# Patient Record
Sex: Male | Born: 1940 | Race: White | Hispanic: No | Marital: Married | Smoking: Former smoker
Health system: Southern US, Community
[De-identification: ages and names within clinical notes are randomized; demographics above are authoritative.]

## PROBLEM LIST (undated history)

## (undated) DIAGNOSIS — C61 Malignant neoplasm of prostate: Secondary | ICD-10-CM

## (undated) DIAGNOSIS — K5792 Diverticulitis of intestine, part unspecified, without perforation or abscess without bleeding: Secondary | ICD-10-CM

## (undated) DIAGNOSIS — I10 Essential (primary) hypertension: Secondary | ICD-10-CM

## (undated) DIAGNOSIS — IMO0001 Reserved for inherently not codable concepts without codable children: Secondary | ICD-10-CM

## (undated) DIAGNOSIS — L570 Actinic keratosis: Secondary | ICD-10-CM

## (undated) DIAGNOSIS — I493 Ventricular premature depolarization: Secondary | ICD-10-CM

## (undated) DIAGNOSIS — R911 Solitary pulmonary nodule: Secondary | ICD-10-CM

## (undated) DIAGNOSIS — R51 Headache: Secondary | ICD-10-CM

## (undated) DIAGNOSIS — E785 Hyperlipidemia, unspecified: Secondary | ICD-10-CM

## (undated) DIAGNOSIS — R05 Cough: Secondary | ICD-10-CM

## (undated) DIAGNOSIS — Z9289 Personal history of other medical treatment: Secondary | ICD-10-CM

## (undated) DIAGNOSIS — M479 Spondylosis, unspecified: Secondary | ICD-10-CM

## (undated) DIAGNOSIS — N183 Chronic kidney disease, stage 3 unspecified: Secondary | ICD-10-CM

## (undated) DIAGNOSIS — M67439 Ganglion, unspecified wrist: Secondary | ICD-10-CM

## (undated) DIAGNOSIS — C439 Malignant melanoma of skin, unspecified: Secondary | ICD-10-CM

## (undated) DIAGNOSIS — R001 Bradycardia, unspecified: Secondary | ICD-10-CM

## (undated) DIAGNOSIS — I714 Abdominal aortic aneurysm, without rupture: Secondary | ICD-10-CM

## (undated) HISTORY — DX: Diverticulitis of intestine, part unspecified, without perforation or abscess without bleeding: K57.92

## (undated) HISTORY — DX: Headache: R51

## (undated) HISTORY — DX: Actinic keratosis: L57.0

## (undated) HISTORY — DX: Personal history of other medical treatment: Z92.89

## (undated) HISTORY — DX: Ventricular premature depolarization: I49.3

## (undated) HISTORY — DX: Bradycardia, unspecified: R00.1

## (undated) HISTORY — DX: Essential (primary) hypertension: I10

## (undated) HISTORY — DX: Spondylosis, unspecified: M47.9

## (undated) HISTORY — DX: Ganglion, unspecified wrist: M67.439

## (undated) HISTORY — PX: PROSTATECTOMY: SHX69

## (undated) HISTORY — DX: Chronic kidney disease, stage 3 unspecified: N18.30

## (undated) HISTORY — DX: Reserved for inherently not codable concepts without codable children: IMO0001

## (undated) HISTORY — DX: Hyperlipidemia, unspecified: E78.5

## (undated) HISTORY — DX: Solitary pulmonary nodule: R91.1

## (undated) HISTORY — DX: Chronic kidney disease, stage 3 (moderate): N18.3

## (undated) HISTORY — DX: Malignant melanoma of skin, unspecified: C43.9

## (undated) HISTORY — DX: Cough: R05

## (undated) HISTORY — PX: LUMBAR DISC SURGERY: SHX700

## (undated) HISTORY — DX: Abdominal aortic aneurysm, without rupture: I71.4

## (undated) HISTORY — PX: SHOULDER SURGERY: SHX246

---

## 2001-12-03 ENCOUNTER — Encounter: Payer: Self-pay | Admitting: Neurosurgery

## 2001-12-03 ENCOUNTER — Ambulatory Visit (HOSPITAL_COMMUNITY): Admission: RE | Admit: 2001-12-03 | Discharge: 2001-12-03 | Payer: Self-pay | Admitting: Neurosurgery

## 2003-09-02 ENCOUNTER — Encounter: Admission: RE | Admit: 2003-09-02 | Discharge: 2003-11-03 | Payer: Self-pay | Admitting: Family Medicine

## 2004-03-31 ENCOUNTER — Encounter: Admission: RE | Admit: 2004-03-31 | Discharge: 2004-03-31 | Payer: Self-pay | Admitting: Family Medicine

## 2004-04-26 ENCOUNTER — Ambulatory Visit (HOSPITAL_COMMUNITY): Admission: RE | Admit: 2004-04-26 | Discharge: 2004-04-27 | Payer: Self-pay | Admitting: Neurosurgery

## 2008-07-07 ENCOUNTER — Encounter (INDEPENDENT_AMBULATORY_CARE_PROVIDER_SITE_OTHER): Payer: Self-pay | Admitting: Urology

## 2008-07-07 ENCOUNTER — Inpatient Hospital Stay (HOSPITAL_COMMUNITY): Admission: RE | Admit: 2008-07-07 | Discharge: 2008-07-08 | Payer: Self-pay | Admitting: Urology

## 2008-07-10 ENCOUNTER — Emergency Department (HOSPITAL_COMMUNITY): Admission: EM | Admit: 2008-07-10 | Discharge: 2008-07-11 | Payer: Self-pay | Admitting: Emergency Medicine

## 2010-06-20 DIAGNOSIS — K5792 Diverticulitis of intestine, part unspecified, without perforation or abscess without bleeding: Secondary | ICD-10-CM

## 2010-06-20 HISTORY — DX: Diverticulitis of intestine, part unspecified, without perforation or abscess without bleeding: K57.92

## 2010-07-09 ENCOUNTER — Encounter
Admission: RE | Admit: 2010-07-09 | Discharge: 2010-07-20 | Payer: Self-pay | Source: Home / Self Care | Attending: Family Medicine | Admitting: Family Medicine

## 2010-07-30 ENCOUNTER — Ambulatory Visit: Payer: Medicare Other | Attending: Family Medicine | Admitting: Physical Therapy

## 2010-07-30 DIAGNOSIS — M545 Low back pain, unspecified: Secondary | ICD-10-CM | POA: Insufficient documentation

## 2010-07-30 DIAGNOSIS — IMO0001 Reserved for inherently not codable concepts without codable children: Secondary | ICD-10-CM | POA: Insufficient documentation

## 2010-07-30 DIAGNOSIS — M25659 Stiffness of unspecified hip, not elsewhere classified: Secondary | ICD-10-CM | POA: Insufficient documentation

## 2010-08-03 ENCOUNTER — Encounter: Payer: Medicare Other | Admitting: Physical Therapy

## 2010-08-03 ENCOUNTER — Ambulatory Visit: Payer: Medicare Other | Admitting: Physical Therapy

## 2010-08-04 ENCOUNTER — Encounter: Payer: Medicare Other | Admitting: Physical Therapy

## 2010-08-06 ENCOUNTER — Ambulatory Visit: Payer: Medicare Other | Admitting: Physical Therapy

## 2010-08-10 ENCOUNTER — Other Ambulatory Visit: Payer: Self-pay | Admitting: Family Medicine

## 2010-08-10 ENCOUNTER — Encounter: Payer: Medicare Other | Admitting: Physical Therapy

## 2010-08-10 DIAGNOSIS — M545 Low back pain, unspecified: Secondary | ICD-10-CM

## 2010-08-13 ENCOUNTER — Other Ambulatory Visit: Payer: Medicare Other

## 2010-08-20 ENCOUNTER — Ambulatory Visit: Payer: Medicare Other | Attending: Family Medicine | Admitting: Physical Therapy

## 2010-08-20 DIAGNOSIS — M25659 Stiffness of unspecified hip, not elsewhere classified: Secondary | ICD-10-CM | POA: Insufficient documentation

## 2010-08-20 DIAGNOSIS — M545 Low back pain, unspecified: Secondary | ICD-10-CM | POA: Insufficient documentation

## 2010-08-20 DIAGNOSIS — IMO0001 Reserved for inherently not codable concepts without codable children: Secondary | ICD-10-CM | POA: Insufficient documentation

## 2010-08-24 ENCOUNTER — Ambulatory Visit: Payer: Medicare Other | Admitting: Physical Therapy

## 2010-08-25 ENCOUNTER — Ambulatory Visit: Payer: Medicare Other | Admitting: Physical Therapy

## 2010-08-30 ENCOUNTER — Ambulatory Visit
Admission: RE | Admit: 2010-08-30 | Discharge: 2010-08-30 | Disposition: A | Discharge status: Home / Self Care | Payer: Medicare Other | Source: Ambulatory Visit | Attending: Family Medicine | Admitting: Family Medicine

## 2010-08-30 DIAGNOSIS — M545 Low back pain, unspecified: Secondary | ICD-10-CM

## 2010-08-31 ENCOUNTER — Ambulatory Visit: Payer: Medicare Other | Admitting: Physical Therapy

## 2010-10-04 LAB — BASIC METABOLIC PANEL
BUN: 30 mg/dL — ABNORMAL HIGH (ref 6–23)
CO2: 27 mEq/L (ref 19–32)
Calcium: 9.2 mg/dL (ref 8.4–10.5)
Chloride: 105 mEq/L (ref 96–112)
Creatinine, Ser: 1.21 mg/dL (ref 0.4–1.5)
GFR calc Af Amer: >60 mL/min (ref >60)
GFR calc non Af Amer: 60 mL/min — ABNORMAL LOW (ref >60)
Glucose, Bld: 140 mg/dL — ABNORMAL HIGH (ref 70–99)
Potassium: 3.9 mEq/L (ref 3.5–5.1)
Sodium: 140 mEq/L (ref 135–145)

## 2010-10-04 LAB — HEMOGLOBIN AND HEMATOCRIT, BLOOD
HCT: 35.1 % — ABNORMAL LOW (ref 39.0–52.0)
HCT: 38.1 % — ABNORMAL LOW (ref 39.0–52.0)
Hemoglobin: 11.9 g/dL — ABNORMAL LOW (ref 13.0–17.0)
Hemoglobin: 12.6 g/dL — ABNORMAL LOW (ref 13.0–17.0)

## 2010-10-04 LAB — CBC
HCT: 39.9 % (ref 39.0–52.0)
Hemoglobin: 13.2 g/dL (ref 13.0–17.0)
MCHC: 33.1 g/dL (ref 30.0–36.0)
MCV: 92.8 fL (ref 78.0–100.0)
Platelets: 193 10*3/uL (ref 150–400)
RBC: 4.3 MIL/uL (ref 4.22–5.81)
RDW: 13.8 % (ref 11.5–15.5)
WBC: 7.3 10*3/uL (ref 4.0–10.5)

## 2010-10-04 LAB — TYPE AND SCREEN
ABO/RH(D): O NEG
Antibody Screen: NEGATIVE

## 2010-10-04 LAB — ABO/RH: ABO/RH(D): O NEG

## 2010-11-02 NOTE — Discharge Summary (Signed)
NAMECORY, Victor Stark              ACCOUNT NO.:  [id number]   MEDICAL RECORD NO.:  [id number]          PATIENT TYPE:  INP   LOCATION:  1425                         FACILITY:  [facility]   PHYSICIAN:  Heloise Purpura, MD      DATE OF BIRTH:  [birth year]   DATE OF ADMISSION:  07/07/2008  DATE OF DISCHARGE:  07/08/2008                               DISCHARGE SUMMARY   ADMISSION DIAGNOSES:  Clinically localized adenocarcinoma of the  prostate.   DISCHARGE DIAGNOSES:  Clinically localized adenocarcinoma of the  prostate.   PROCEDURES:  1. Robotic assisted laparoscopic radical prostatectomy (bilateral      nerve sparing).  2. Bilateral pelvic lymph adenectomy.   HISTORY AND PHYSICAL:  For full details, please see admission history  and physical.  Briefly, [name] is a 70 year old gentleman who was  found to have clinically localized adenocarcinoma of the prostate.  After careful consideration regarding management options for treatment,  he elected to proceed with surgical therapy and a robotic assisted  laparoscopic radical prostatectomy with bilateral pelvic  lymphadenectomy.   HOSPITAL COURSE:  On July 07, 2008, he was taken to the operating  room and underwent a robotic assisted laparoscopic radical prostatectomy  which he tolerated well and without complications.  Postoperatively, he  was able to be transferred to a regular hospital room following recovery  from anesthesia.  He was able to begin ambulation that evening.  He  remained hemodynamically stable.  Postoperative hematocrit was 38.1.  On  the morning of postoperative day #1, his hematocrit was also found to be  stable at 35.1.  He maintained excellent urine output with minimal  output from his pelvic drain.  Therefore, the pelvic drain was removed.  He was placed on a clear liquid diet and continued to ambulate.  He was  re-evaluated on the afternoon of postoperative day #1.  His urinary  output, blood pressure and pulse  all remained stable.  He was also able  to tolerate his clear liquid diet without nausea or vomiting.  Therefore, he was felt to be stable for discharge as he had met all  discharge criteria.   DISPOSITION:  Home.   DISCHARGE MEDICATIONS:  He was instructed to resume his regular home  medications including Lipitor and lisinopril.  In addition, he was  provided a prescription for Percocet to take as needed for pain and told  to use Colace as a stool softener.  He was also provided a prescription  for Cipro to begin 1 day prior to return for Foley catheter removal.  He  was instructed to hold his aspirin, nonsteroidal anti-inflammatory  medications, herbal supplement, and multivitamins for 10 days.   DISCHARGE INSTRUCTIONS:  He was instructed to be ambulatory but  specifically told to refrain from any heavy lifting, strenuous activity,  or driving.  He was instructed on routine Foley catheter care and told  to gradually advance his diet over the course of the next few days.   FOLLOWUP:  He will follow up in 1 week for Foley catheter removal and  skin staple removal.  Delia Chimes, NP      Heloise Purpura, MD  Electronically Signed    MA/MEDQ  D:  07/08/2008  T:  07/08/2008  Job:  [id number]

## 2010-11-02 NOTE — Op Note (Signed)
NAMEROMEO, ZIELINSKI              ACCOUNT NO.:  [id number]   MEDICAL RECORD NO.:  [id number]          PATIENT TYPE:  INP   LOCATION:  1425                         FACILITY:  [facility]   PHYSICIAN:  Heloise Purpura, MD      DATE OF BIRTH:  05/23/1941   DATE OF PROCEDURE:  07/07/2008  DATE OF DISCHARGE:                               OPERATIVE REPORT   PREOPERATIVE DIAGNOSIS:  Clinically localized adenocarcinoma of prostate  (cT1c Nx Mx).   POSTOPERATIVE DIAGNOSIS:  Clinically localized adenocarcinoma of  prostate (cT1c Nx Mx).   PROCEDURE:  Robotic assisted laparoscopic radical prostatectomy  (bilateral nerve sparing).   SURGEON:  Dr. Heloise Purpura.   ASSISTANT:  Delia Chimes, nurse practitioner.   ANESTHESIA:  General.   COMPLICATIONS:  None.   ESTIMATED BLOOD LOSS:  75 mL.   SPECIMENS:  Prostate and seminal vesicles.   DISPOSITION OF SPECIMENS:  To Pathology.   DRAINS:  1. 18-French straight catheter.  2. #19 Blake pelvic drain.   INDICATION:  Mr. Grumbine is a gentleman with clinically localized  adenocarcinoma of the prostate.  After discussion regarding management  options for treatment, he elected to proceed with surgical therapy and a  robotic prostatectomy.  The potential risks, complications, and  alternative options associated with this procedure were discussed in  detail and informed consent was obtained.   DESCRIPTION OF PROCEDURE:  The patient was taken to the operating room  and a general anesthetic was administered.  He was given preoperative  antibiotics, placed in the dorsal lithotomy position, prepped and draped  in the usual sterile fashion.  Next a preoperative time-out was  performed.  An attempt was made to place a 22-French Foley catheter, but  the patient was noted to have a narrowed urethral meatus.  Therefore an  18-French Foley catheter was placed to accommodate the urethra. This was  placed without difficulty.  A site was selected just superior  to the  umbilicus for placement of the camera port.  This was placed using a  standard open Hasson technique which allowed entry into the peritoneal  cavity under direct vision without difficulty.  A 12 mm port was then  placed and the pneumoperitoneum established.  The 0 degrees lens was  used to inspect the abdomen and there was no evidence for any intra-  abdominal injuries or other abnormalities.  The remaining ports were  then placed.  Bilateral 8 mm robotic ports were placed in the right and  left lower quadrant.  An additional 8 mm robotic port was placed in the  far left lateral abdominal wall.  A 5 mm port was placed between the  camera port and the right robotic port.  An additional 12 mm port was  placed in the far right lateral abdominal wall for laparoscopic  assistance.  All ports were placed under direct vision and without  difficulty.  The surgical cart was then docked.  With the aid of the  cautery scissors, the bladder was reflected posteriorly allowing entry  into the space of Retzius and identification of the endopelvic fascia  and prostate.  The endopelvic fascia was then incised from the apex back  to the base of prostate bilaterally and the underlying levator muscle  fibers were swept laterally off the prostate thereby isolating the  dorsal venous complex.  The dorsal vein was then stapled and divided  with a 45 mm Flex ETS stapler.  The bladder neck was then identified  with the aid of Foley catheter manipulation and was divided anteriorly  thereby exposing the Foley catheter.  The catheter balloon was then  deflated and the catheter was brought into the operative field and used  to retract the prostate anteriorly.  Dissection then proceeded as the  posterior bladder neck was divided and dissection continued between the  bladder neck and prostate.  The vasa deferentia and seminal vesicles  were then identified.  The vasa deferentia were isolated, divided and   lifted anteriorly.  The seminal vesicles were then dissected down to  their tips with care to control the seminal vesicle arterial blood  supply.  These structures were then lifted anteriorly and the space  between Denonvilliers fascia and the anterior rectum was bluntly  developed thereby isolating the vascular pedicles of the prostate.  The  lateral prostatic fascia was incised allowing the neurovascular bundles  to be released.  Hemoclips were then placed on the vascular pedicles  above the level of the neurovascular bundles and they were divided with  sharp cold scissor dissection.  The urethra was then sharply transected,  allowing the prostate specimen to be disarticulated.  The pelvis was  copiously irrigated and hemostasis was ensured.  Attention then turned  to the urethral anastomosis.  A 2-0 Vicryl slip-knot was placed between  Denonvilliers fascia, the posterior bladder neck, and the posterior  urethra to reapproximate these structures.  A double-armed 3-0 Monocryl  suture was then used to perform a 360 degree running tension-free  anastomosis between the bladder neck and urethra.  A new 20-French  straight catheter was inserted into the bladder and irrigated.  There  were no blood clots within the bladder and the anastomosis appeared to  be watertight.  The way #19 Harrison Mons drain was then brought through the  left robotic port and appropriately positioned in the pelvis.  It was  secured to the skin with a nylon suture.  The surgical cart was then  undocked.  The right lateral 12 mm port site was closed with a 0 Vicryl  suture placed with the aid of the suture passer device.  All remaining  ports were removed under direct vision and the prostate specimen was  removed intact within the Endopouch retrieval bag.  This fascial opening  was then closed with a running 0 Vicryl suture.  All port sites were  injected with 0.25% Marcaine and reapproximated the skin level with  staples.   Sterile dressings were applied.  The patient appeared to  tolerate the procedure well without complications.  He was able to be  extubated and transferred to the recovery unit in satisfactory  condition.      Heloise Purpura, MD  Electronically Signed     LB/MEDQ  D:  07/07/2008  T:  07/08/2008  Job:  [id number]

## 2010-12-29 ENCOUNTER — Ambulatory Visit: Payer: Medicare Other | Attending: Physical Medicine and Rehabilitation | Admitting: Physical Therapy

## 2010-12-29 DIAGNOSIS — M545 Low back pain, unspecified: Secondary | ICD-10-CM | POA: Insufficient documentation

## 2010-12-29 DIAGNOSIS — M2569 Stiffness of other specified joint, not elsewhere classified: Secondary | ICD-10-CM | POA: Insufficient documentation

## 2010-12-29 DIAGNOSIS — IMO0001 Reserved for inherently not codable concepts without codable children: Secondary | ICD-10-CM | POA: Insufficient documentation

## 2011-01-03 ENCOUNTER — Ambulatory Visit: Payer: Medicare Other | Admitting: Physical Therapy

## 2011-01-14 ENCOUNTER — Ambulatory Visit: Payer: Medicare Other | Admitting: Physical Therapy

## 2011-01-20 ENCOUNTER — Ambulatory Visit: Payer: Medicare Other | Attending: Physical Medicine and Rehabilitation | Admitting: Physical Therapy

## 2011-01-20 DIAGNOSIS — M2569 Stiffness of other specified joint, not elsewhere classified: Secondary | ICD-10-CM | POA: Insufficient documentation

## 2011-01-20 DIAGNOSIS — M545 Low back pain, unspecified: Secondary | ICD-10-CM | POA: Insufficient documentation

## 2011-01-20 DIAGNOSIS — IMO0001 Reserved for inherently not codable concepts without codable children: Secondary | ICD-10-CM | POA: Insufficient documentation

## 2011-01-21 ENCOUNTER — Ambulatory Visit: Payer: Medicare Other | Admitting: Physical Therapy

## 2011-01-25 ENCOUNTER — Ambulatory Visit: Payer: Medicare Other | Admitting: Physical Therapy

## 2011-01-27 ENCOUNTER — Encounter: Payer: Medicare Other | Admitting: Physical Therapy

## 2011-02-01 ENCOUNTER — Ambulatory Visit: Payer: Medicare Other | Admitting: Physical Therapy

## 2011-02-15 ENCOUNTER — Ambulatory Visit: Payer: Medicare Other | Admitting: Physical Therapy

## 2011-02-17 ENCOUNTER — Encounter: Payer: Medicare Other | Admitting: Physical Therapy

## 2011-02-22 ENCOUNTER — Encounter: Payer: Medicare Other | Admitting: Physical Therapy

## 2011-02-24 ENCOUNTER — Encounter: Payer: Medicare Other | Admitting: Physical Therapy

## 2011-04-15 ENCOUNTER — Other Ambulatory Visit: Payer: Self-pay | Admitting: Family Medicine

## 2011-04-15 DIAGNOSIS — R7989 Other specified abnormal findings of blood chemistry: Secondary | ICD-10-CM

## 2011-04-18 ENCOUNTER — Ambulatory Visit
Admission: RE | Admit: 2011-04-18 | Discharge: 2011-04-18 | Disposition: A | Discharge status: Home / Self Care | Payer: Medicare Other | Source: Ambulatory Visit | Attending: Family Medicine | Admitting: Family Medicine

## 2011-04-18 DIAGNOSIS — R7989 Other specified abnormal findings of blood chemistry: Secondary | ICD-10-CM

## 2011-06-21 DIAGNOSIS — C439 Malignant melanoma of skin, unspecified: Secondary | ICD-10-CM

## 2011-06-21 HISTORY — DX: Malignant melanoma of skin, unspecified: C43.9

## 2011-09-01 ENCOUNTER — Encounter (HOSPITAL_BASED_OUTPATIENT_CLINIC_OR_DEPARTMENT_OTHER): Admission: RE | Payer: Self-pay | Source: Ambulatory Visit

## 2011-09-01 ENCOUNTER — Ambulatory Visit (HOSPITAL_BASED_OUTPATIENT_CLINIC_OR_DEPARTMENT_OTHER): Admission: RE | Admit: 2011-09-01 | Payer: Medicare Other | Source: Ambulatory Visit | Admitting: Orthopedic Surgery

## 2011-09-01 SURGERY — ARTHROSCOPY, KNEE, WITH MEDIAL MENISCECTOMY
Anesthesia: Choice | Laterality: Right

## 2011-12-31 ENCOUNTER — Encounter (HOSPITAL_COMMUNITY): Payer: Self-pay | Admitting: *Deleted

## 2011-12-31 ENCOUNTER — Other Ambulatory Visit: Payer: Self-pay

## 2011-12-31 ENCOUNTER — Emergency Department (HOSPITAL_COMMUNITY): Payer: Medicare Other

## 2011-12-31 ENCOUNTER — Emergency Department (HOSPITAL_COMMUNITY)
Admission: EM | Admit: 2011-12-31 | Discharge: 2011-12-31 | Disposition: A | Discharge status: Home / Self Care | Payer: Medicare Other | Attending: Emergency Medicine | Admitting: Emergency Medicine

## 2011-12-31 DIAGNOSIS — W1809XA Striking against other object with subsequent fall, initial encounter: Secondary | ICD-10-CM | POA: Insufficient documentation

## 2011-12-31 DIAGNOSIS — R221 Localized swelling, mass and lump, neck: Secondary | ICD-10-CM | POA: Insufficient documentation

## 2011-12-31 DIAGNOSIS — I10 Essential (primary) hypertension: Secondary | ICD-10-CM | POA: Insufficient documentation

## 2011-12-31 DIAGNOSIS — Z79899 Other long term (current) drug therapy: Secondary | ICD-10-CM | POA: Insufficient documentation

## 2011-12-31 DIAGNOSIS — F29 Unspecified psychosis not due to a substance or known physiological condition: Secondary | ICD-10-CM | POA: Insufficient documentation

## 2011-12-31 DIAGNOSIS — Z8546 Personal history of malignant neoplasm of prostate: Secondary | ICD-10-CM | POA: Insufficient documentation

## 2011-12-31 DIAGNOSIS — R220 Localized swelling, mass and lump, head: Secondary | ICD-10-CM | POA: Insufficient documentation

## 2011-12-31 DIAGNOSIS — S060X9A Concussion with loss of consciousness of unspecified duration, initial encounter: Secondary | ICD-10-CM | POA: Insufficient documentation

## 2011-12-31 DIAGNOSIS — IMO0002 Reserved for concepts with insufficient information to code with codable children: Secondary | ICD-10-CM | POA: Insufficient documentation

## 2011-12-31 DIAGNOSIS — R110 Nausea: Secondary | ICD-10-CM | POA: Insufficient documentation

## 2011-12-31 DIAGNOSIS — R51 Headache: Secondary | ICD-10-CM | POA: Insufficient documentation

## 2011-12-31 DIAGNOSIS — Y93H2 Activity, gardening and landscaping: Secondary | ICD-10-CM | POA: Insufficient documentation

## 2011-12-31 DIAGNOSIS — S0003XA Contusion of scalp, initial encounter: Secondary | ICD-10-CM | POA: Insufficient documentation

## 2011-12-31 DIAGNOSIS — W19XXXA Unspecified fall, initial encounter: Secondary | ICD-10-CM

## 2011-12-31 DIAGNOSIS — S0083XA Contusion of other part of head, initial encounter: Secondary | ICD-10-CM | POA: Insufficient documentation

## 2011-12-31 HISTORY — DX: Malignant neoplasm of prostate: C61

## 2011-12-31 HISTORY — DX: Essential (primary) hypertension: I10

## 2011-12-31 NOTE — ED Notes (Signed)
Charge RN Redmond Baseman  Made aware of pt current status.

## 2011-12-31 NOTE — ED Notes (Signed)
Pt education about concussive injuries provided including signs and symptoms of an emergency and appropriate follow-up care.

## 2011-12-31 NOTE — ED Notes (Addendum)
Pt was pulling at a root/branch and tripped (witnessed by spouse) striking right side of head and jaw and chin on a log. Pt additionally scraped chest superficially. The event was not preceded by any weakness, dizziness or other symptoms. Pt apparently was minimally responsive for several minutes but eventually aroused and walked while dizzy and confused with spouse into home. Pt was initially nauseated but no vomiting. Pt has no memory of event, but past and current memory is intact. Pt also has no headache, nausea, vision changes, or neurological defects. Pt is able to open and shut jaw normally, but reports muscle/tuissue pain in face. No dental or oral trauma. Pt is A&O, in no distress. Vitals WNL.

## 2011-12-31 NOTE — ED Notes (Signed)
Per pt wife. Pt fell around 515PM after tripping over a log and hitting his right forehead and abrasions to chest.  Pt hit his head on another log.  Pt was unconscious for a few minutes after hitting head. Pt does not recall event and is having difficulty with recent memory.  Pt does not take blood thinners or aspirin.  Pt alert to person, place, time and situation.

## 2011-12-31 NOTE — ED Provider Notes (Signed)
History     CSN: [id number]  Arrival date & time 12/31/11  1742   First MD Initiated Contact with Patient 12/31/11 2118      Chief Complaint  Patient presents with  . Fall  . Loss of Consciousness  . Headache  . Nausea  . Abrasion  . Facial Swelling    (Consider location/radiation/quality/duration/timing/severity/associated sxs/prior treatment) Patient is a 71 y.o. male presenting with fall, syncope, and headaches. The history is provided by the patient.  Fall The accident occurred 3 to 5 hours ago. Associated symptoms include headaches. Pertinent negatives include no numbness, no abdominal pain, no nausea and no vomiting.  Loss of Consciousness Associated symptoms include headaches. Pertinent negatives include no chest pain, no abdominal pain and no shortness of breath.  Headache  Associated symptoms include syncope. Pertinent negatives include no shortness of breath, no nausea and no vomiting.   patient was attempting to clear some brush. He was pulling on a branch that came free and he fell back and hit his head on another blood. He was unconscious for a couple minutes. After that he was confused and had some repetitive questioning. His headache is improved as has his mental status. No numbness or weakness. He has some pain in his right shoulder, but has had recent surgery there. He states the pain is no worse than normal. No chest pain. No abdominal pain.  Past Medical History  Diagnosis Date  . Hypertension   . Prostate cancer     Past Surgical History  Procedure Date  . Prostatectomy   . Rotator cuff repair   . Lumbar disc surgery   . Shoulder surgery     No family history on file.  History  Substance Use Topics  . Smoking status: Former Games developer  . Smokeless tobacco: Not on file  . Alcohol Use: Yes     rare      Review of Systems  Constitutional: Negative for activity change and appetite change.  HENT: Negative for neck stiffness.   Eyes: Negative for  pain.  Respiratory: Negative for chest tightness and shortness of breath.   Cardiovascular: Positive for syncope. Negative for chest pain and leg swelling.  Gastrointestinal: Negative for nausea, vomiting, abdominal pain and diarrhea.  Genitourinary: Negative for flank pain.  Musculoskeletal: Negative for back pain.  Skin: Negative for rash.  Neurological: Positive for syncope and headaches. Negative for weakness and numbness.  Psychiatric/Behavioral: Positive for confusion. Negative for behavioral problems.    Allergies  Lodine; Vicodin; and Amoxicillin  Home Medications   Current Outpatient Rx  Name Route Sig Dispense Refill  . HYDROCHLOROTHIAZIDE 12.5 MG PO TABS Oral Take 12.5 mg by mouth daily.    Marland Kitchen LISINOPRIL 2.5 MG PO TABS Oral Take 20 mg by mouth daily.    . ADULT MULTIVITAMIN W/MINERALS CH Oral Take 1 tablet by mouth daily.    Marland Kitchen SIMVASTATIN 20 MG PO TABS Oral Take 20 mg by mouth every evening.    . TRAZODONE HCL 50 MG PO TABS Oral Take 50 mg by mouth at bedtime.    Marland Kitchen VALACYCLOVIR HCL 500 MG PO TABS Oral Take 500 mg by mouth 2 (two) times daily.      BP 155/91  Pulse 58  Temp 98.2 F (36.8 C) (Oral)  Resp 18  SpO2 98%  Physical Exam  Nursing note and vitals reviewed. Constitutional: He is oriented to person, place, and time. He appears well-developed and well-nourished.  HENT:  Head: Normocephalic.  Approximately 3 cm hematoma right temporal area. Ecchymosis on the hematoma and inferiorly over the right jaw. Range of motion of jaw is intact. He is able to break a tongue depressor in his teeth with twisting.  Eyes: EOM are normal. Pupils are equal, round, and reactive to light.  Neck: Normal range of motion. Neck supple.  Cardiovascular: Normal rate, regular rhythm and normal heart sounds.   No murmur heard. Pulmonary/Chest: Effort normal and breath sounds normal.  Abdominal: Soft. Bowel sounds are normal. He exhibits no distension and no mass. There is no  tenderness. There is no rebound and no guarding.  Musculoskeletal: Normal range of motion. He exhibits no edema.       Well-healing surgical scar to right shoulder. Neurovascularly intact distally.  Neurological: He is alert and oriented to person, place, and time. No cranial nerve deficit.  Skin: Skin is warm and dry.  Psychiatric: He has a normal mood and affect.    ED Course  Procedures (including critical care time)  Labs Reviewed - No data to display Ct Head Wo Contrast  12/31/2011  *RADIOLOGY REPORT*  Clinical Data: Fall.  Right temporal abrasion.  Loss of consciousness.  History prostate cancer.  CT HEAD WITHOUT CONTRAST  Technique:  Contiguous axial images were obtained from the base of the skull through the vertex without contrast.  Comparison: None.  Findings: Right temporal soft tissue swelling.  No underlying fracture or intracranial hemorrhage.  No hydrocephalus.  Mild small vessel disease type changes without CT evidence of large acute infarct.  No intracranial mass lesion detected on this unenhanced exam.  Vascular calcifications.  Orbital structures appear to be grossly intact.  IMPRESSION: Right temporal soft tissue swelling without underlying fracture or intracranial hemorrhage.  Original Report Authenticated By: Fuller Canada, M.D.     1. Concussion   2. Fall     Date: 12/31/2011  Rate: 57  Rhythm: sinus bradycardia  QRS Axis: normal  Intervals: normal  ST/T Wave abnormalities: normal  Conduction Disutrbances:right bundle branch block  Narrative Interpretation: QRS is wider than previous  Old EKG Reviewed: changes noted     MDM  Patient with syncope after falling and hitting his head. Negative head CT. Mental status has returned to normal. Is not on blood thinners. Patient be discharged home.        Juliet Rude. Rubin Payor, MD 12/31/11 2146

## 2011-12-31 NOTE — ED Notes (Signed)
Wife present reports pt had Loss of consciousness about 2 minutes.  Pt at present alert and active with care. Pt denies neck and back pain- already had CT

## 2011-12-31 NOTE — ED Notes (Signed)
Pt remains without distress- Wife present and updated on pt current status

## 2012-06-20 HISTORY — PX: ROTATOR CUFF REPAIR: SHX139

## 2012-06-29 ENCOUNTER — Other Ambulatory Visit: Payer: Self-pay | Admitting: Family Medicine

## 2012-06-29 DIAGNOSIS — R935 Abnormal findings on diagnostic imaging of other abdominal regions, including retroperitoneum: Secondary | ICD-10-CM

## 2012-07-04 ENCOUNTER — Other Ambulatory Visit: Payer: Self-pay | Admitting: Family Medicine

## 2012-07-04 ENCOUNTER — Ambulatory Visit
Admission: RE | Admit: 2012-07-04 | Discharge: 2012-07-04 | Disposition: A | Discharge status: Home / Self Care | Payer: Medicare Other | Source: Ambulatory Visit | Attending: Family Medicine | Admitting: Family Medicine

## 2012-07-04 DIAGNOSIS — R935 Abnormal findings on diagnostic imaging of other abdominal regions, including retroperitoneum: Secondary | ICD-10-CM

## 2012-08-27 ENCOUNTER — Other Ambulatory Visit: Payer: Self-pay | Admitting: Family Medicine

## 2012-08-27 DIAGNOSIS — R9389 Abnormal findings on diagnostic imaging of other specified body structures: Secondary | ICD-10-CM

## 2012-09-12 ENCOUNTER — Ambulatory Visit
Admission: RE | Admit: 2012-09-12 | Discharge: 2012-09-12 | Disposition: A | Discharge status: Home / Self Care | Payer: Medicare Other | Source: Ambulatory Visit | Attending: Family Medicine | Admitting: Family Medicine

## 2012-09-12 DIAGNOSIS — R9389 Abnormal findings on diagnostic imaging of other specified body structures: Secondary | ICD-10-CM

## 2013-03-21 ENCOUNTER — Institutional Professional Consult (permissible substitution): Payer: Medicare Other | Admitting: Internal Medicine

## 2013-03-22 ENCOUNTER — Encounter: Payer: Self-pay | Admitting: Internal Medicine

## 2013-03-22 ENCOUNTER — Ambulatory Visit (INDEPENDENT_AMBULATORY_CARE_PROVIDER_SITE_OTHER)
Admission: RE | Admit: 2013-03-22 | Discharge: 2013-03-22 | Disposition: A | Discharge status: Home / Self Care | Payer: Medicare Other | Source: Ambulatory Visit | Attending: Internal Medicine | Admitting: Internal Medicine

## 2013-03-22 ENCOUNTER — Ambulatory Visit (INDEPENDENT_AMBULATORY_CARE_PROVIDER_SITE_OTHER): Payer: Medicare Other | Admitting: Internal Medicine

## 2013-03-22 VITALS — BP 112/70 | HR 60 | Temp 98.2°F | Ht 69.0 in | Wt 179.2 lb

## 2013-03-22 DIAGNOSIS — R911 Solitary pulmonary nodule: Secondary | ICD-10-CM

## 2013-03-22 DIAGNOSIS — R059 Cough, unspecified: Secondary | ICD-10-CM

## 2013-03-22 DIAGNOSIS — R05 Cough: Secondary | ICD-10-CM | POA: Insufficient documentation

## 2013-03-22 HISTORY — DX: Cough, unspecified: R05.9

## 2013-03-22 MED ORDER — OLMESARTAN MEDOXOMIL-HCTZ 40-25 MG PO TABS: ORAL_TABLET | ORAL | Status: DC

## 2013-03-22 MED ORDER — FAMOTIDINE 20 MG PO TABS: ORAL_TABLET | ORAL | Status: DC

## 2013-03-22 NOTE — Progress Notes (Signed)
Quick Note:  Spoke with pt and notified of results per Dr. Wert. Pt verbalized understanding and denied any questions.  ______ 

## 2013-03-22 NOTE — Patient Instructions (Addendum)
Stop lisinopril in both forms  Start benicar 40/25 one half daily   Pepcid ac 20 mg at bedtime until no coughing at all  GERD (REFLUX)  is an extremely common cause of respiratory symptoms, many times with no significant heartburn at all.    It can be treated with medication, but also with lifestyle changes including avoidance of late meals, excessive alcohol, smoking cessation, and avoid fatty foods, chocolate, peppermint, colas, red wine, and acidic juices such as orange juice.  NO MINT OR MENTHOL PRODUCTS SO NO COUGH DROPS  USE SUGARLESS CANDY INSTEAD (jolley ranchers or Stover's)  NO OIL BASED VITAMINS - use powdered substitutes.   If  In 4 weeks you are satisfied with your treatment plan let your doctor know and he/she can either refill your medications or you can return here when your prescription runs out.     If in any way you are not 100% satisfied,  Return here in 4 weeks >  If 100% better, tell your friends!   Please remember to go to the  x-ray department downstairs for your tests - we will call you with the results when they are available.

## 2013-03-22 NOTE — Progress Notes (Signed)
  Subjective:    Patient ID: Victor Stark, male    DOB: 04-13-1941  MRN: [id number]  HPI   61 yowm quit smoking in 1994 and then developed cough April 2014 referred by Dr Clarene Duke for pulmonary eval.   03/22/2013 [facility] office visit/ Nyron Mozer cc indolent onset persistent daily cough since April 2014 some better p prednisone rx, notices esp at hs, mostly dry but doesn't wake him up.  No obvious day to day or daytime variabilty or assoc sob  cp or chest tightness, subjective wheeze overt sinus or hb symptoms. No unusual exp hx or h/o childhood pna/ asthma or knowledge of premature birth.  Sleeping ok without nocturnal  or early am exacerbation  of respiratory  c/o's or need for noct saba. Also denies any obvious fluctuation of symptoms with weather or environmental changes or other aggravating or alleviating factors except as outlined above   Current Medications, Allergies, Complete Past Medical History, Past Surgical History, Family History, and Social History were reviewed in [employer] record.         Review of Systems  Constitutional: Negative for fever, chills, activity change, appetite change and unexpected weight change.  HENT: Negative for congestion, sore throat, rhinorrhea, sneezing, trouble swallowing, dental problem, voice change and postnasal drip.   Eyes: Negative for visual disturbance.  Respiratory: Positive for cough. Negative for choking and shortness of breath.   Cardiovascular: Negative for chest pain and leg swelling.  Gastrointestinal: Negative for nausea, vomiting and abdominal pain.  Genitourinary: Negative for difficulty urinating.  Musculoskeletal: Negative for arthralgias.  Skin: Negative for rash.  Psychiatric/Behavioral: Negative for behavioral problems and confusion.       Objective:   Physical Exam   amb wm nad Wt Readings from Last 3 Encounters:  03/22/13 179 lb 3.2 oz (81.285 kg)      HEENT: nl dentition,  turbinates, and orophanx. Nl external ear canals without cough reflex   NECK :  without JVD/Nodes/TM/ nl carotid upstrokes bilaterally   LUNGS: no acc muscle use, clear to A and P bilaterally without cough on insp or exp maneuvers   CV:  RRR  no s3 or murmur or increase in P2, no edema   ABD:  soft and nontender with nl excursion in the supine position. No bruits or organomegaly, bowel sounds nl  MS:  warm without deformities, calf tenderness, cyanosis or clubbing  SKIN: warm and dry without lesions    NEURO:  alert, approp, no deficits    CXR  03/22/2013 : No active disease. Stable nodular scarring lingula   Ct chest s contrast 09/12/12 vs 07/04/12    1. The band of volume loss in the lingula is slightly less thick  than on the prior exam, with linear configuration favoring scarring  or atelectasis. The appearance of volume loss seems less likely to  reflect tumor, and no definite subtending endobronchial lesion is  identified. Underlying cause is uncertain. This is adjacent to an  accessory fissure of the lingula. This lesion does not really have  a nodular configuration.       Assessment & Plan:

## 2013-03-23 DIAGNOSIS — R911 Solitary pulmonary nodule: Secondary | ICD-10-CM | POA: Insufficient documentation

## 2013-03-23 HISTORY — DX: Solitary pulmonary nodule: R91.1

## 2013-03-23 NOTE — Assessment & Plan Note (Signed)
Linear in nature and likely benign and unrelated to cough, further "serial CT" very unlikely to add anything here but cost and more RT so optional

## 2013-03-23 NOTE — Assessment & Plan Note (Signed)
The most common causes of chronic cough in immunocompetent adults include the following: upper airway cough syndrome (UACS), previously referred to as postnasal drip syndrome (PNDS), which is caused by variety of rhinosinus conditions; (2) asthma; (3) GERD; (4) chronic bronchitis from cigarette smoking or other inhaled environmental irritants; (5) nonasthmatic eosinophilic bronchitis; and (6) bronchiectasis.   These conditions, singly or in combination, have accounted for up to 94% of the causes of chronic cough in prospective studies.   Other conditions have constituted no >6% of the causes in prospective studies These have included bronchogenic carcinoma, chronic interstitial pneumonia, sarcoidosis, left ventricular failure, ACEI-induced cough, and aspiration from a condition associated with pharyngeal dysfunction.    Chronic cough is often simultaneously caused by more than one condition. A single cause has been found from 38 to 82% of the time, multiple causes from 18 to 62%. Multiply caused cough has been the result of three diseases up to 42% of the time.      Most likely this is an acei case in a pt with low grade noct gerd or pnds so first step is stop the acei and try short term gerd rx  See instructions for specific recommendations which were reviewed directly with the patient who was given a copy with highlighter outlining the key components.

## 2013-03-28 ENCOUNTER — Telehealth: Payer: Self-pay | Admitting: Internal Medicine

## 2013-03-28 NOTE — Telephone Encounter (Signed)
Pt informed of recommendations regarding fish oil .  Pt verified understanding.

## 2013-03-28 NOTE — Telephone Encounter (Signed)
ATC, NA, no voicemail. Powdered form does not mean actual powder, it can be a tablet, gummies, etc, just not the oil based gel pills. Carron Curie, CMA

## 2013-04-19 ENCOUNTER — Ambulatory Visit (INDEPENDENT_AMBULATORY_CARE_PROVIDER_SITE_OTHER): Payer: Medicare Other | Admitting: Internal Medicine

## 2013-04-19 ENCOUNTER — Encounter: Payer: Self-pay | Admitting: Internal Medicine

## 2013-04-19 VITALS — BP 112/70 | HR 48 | Temp 97.9°F | Ht 69.0 in | Wt 179.0 lb

## 2013-04-19 DIAGNOSIS — I10 Essential (primary) hypertension: Secondary | ICD-10-CM | POA: Insufficient documentation

## 2013-04-19 DIAGNOSIS — R05 Cough: Secondary | ICD-10-CM

## 2013-04-19 DIAGNOSIS — R059 Cough, unspecified: Secondary | ICD-10-CM

## 2013-04-19 HISTORY — DX: Essential (primary) hypertension: I10

## 2013-04-19 MED ORDER — BENZONATATE 200 MG PO CAPS: ORAL_CAPSULE | ORAL | Status: DC

## 2013-04-19 MED ORDER — VALSARTAN-HYDROCHLOROTHIAZIDE 160-25 MG PO TABS: 1.0000 | ORAL_TABLET | Freq: Every day | ORAL | Status: DC

## 2013-04-19 NOTE — Progress Notes (Signed)
Subjective:    Patient ID: Victor Stark, male    DOB: [birth year]  MRN: [id number]     Brief patient profile:  36 yowm quit smoking in 1994 and then developed cough April 2014 referred by Dr Clarene Duke for pulmonary eval.  .History of Present Illness  03/22/2013 1st [facility] office visit/ Joss Mcdill cc indolent onset persistent daily cough since April 2014 some better p prednisone rx, notices esp at hs, mostly dry but doesn't wake him up. rec Stop lisinopril in both forms Start benicar 40/25 one half daily  Pepcid ac 20 mg at bedtime until no coughing at all GERD diet    04/19/2013 f/u ov/Alline Pio re: cough off ace x 4 weeks Chief Complaint  Patient presents with  . Follow-up    Cough has improved some but not resolved. No new co's today.   Coughing mostly p supper, very min white mucus. bp ok by self monitor   No obvious day to day or daytime variabilty or assoc sob or cp or chest tightness, subjective wheeze overt sinus or hb symptoms. No unusual exp hx or h/o childhood pna/ asthma or knowledge of premature birth.  Sleeping ok without nocturnal  or early am exacerbation  of respiratory  c/o's or need for noct saba. Also denies any obvious fluctuation of symptoms with weather or environmental changes or other aggravating or alleviating factors except as outlined above   Current Medications, Allergies, Complete Past Medical History, Past Surgical History, Family History, and Social History were reviewed in [employer] record.  ROS  The following are not active complaints unless bolded sore throat, dysphagia, dental problems, itching, sneezing,  nasal congestion or excess/ purulent secretions, ear ache,   fever, chills, sweats, unintended wt loss, pleuritic or exertional cp, hemoptysis,  orthopnea pnd or leg swelling, presyncope, palpitations, heartburn, abdominal pain, anorexia, nausea, vomiting, diarrhea  or change in bowel or urinary habits, change in stools or  urine, dysuria,hematuria,  rash, arthralgias, visual complaints, headache, numbness weakness or ataxia or problems with walking or coordination,  change in mood/affect or memory.                       Objective:   Physical Exam   amb pleasant  wm nad  Wt Readings from Last 3 Encounters:  04/19/13 179 lb (81.194 kg)  03/22/13 179 lb 3.2 oz (81.285 kg)         HEENT: nl dentition, turbinates, and orophanx. Nl external ear canals without cough reflex   NECK :  without JVD/Nodes/TM/ nl carotid upstrokes bilaterally   LUNGS: no acc muscle use, clear to A and P bilaterally without cough on insp or exp maneuvers   CV:  RRR  no s3 or murmur or increase in P2, no edema   ABD:  soft and nontender with nl excursion in the supine position. No bruits or organomegaly, bowel sounds nl  MS:  warm without deformities, calf tenderness, cyanosis or clubbing  SKIN: warm and dry without lesions         CXR  03/22/2013 : No active disease. Stable nodular scarring lingula   Ct chest s contrast 09/12/12 vs 07/04/12    1. The band of volume loss in the lingula is slightly less thick  than on the prior exam, with linear configuration favoring scarring  or atelectasis. The appearance of volume loss seems less likely to  reflect tumor, and no definite subtending endobronchial lesion is  identified. Underlying  cause is uncertain. This is adjacent to an  accessory fissure of the lingula. This lesion does not really have  a nodular configuration.       Assessment & Plan:

## 2013-04-19 NOTE — Assessment & Plan Note (Signed)
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   These are the same pts (now being labeled as having "irritable larynx syndrome" by some cough centers) who not infrequently have a history of having failed to tolerate ace inhibitors,  dry powder inhalers or biphosphonates or report having atypical reflux symptoms that don't respond to standard doses of PPI , and are easily confused as having aecopd or asthma flares by even experienced allergists/ pulmonologists.  Add pepcid after supper and suppress cyclical cough with tessilon Next step would be to add 1st gen h1 p supper and at bedtime if persists but clearly better off acei so continue off

## 2013-04-19 NOTE — Assessment & Plan Note (Signed)
Adequate control on present rx, reviewed > no change in rx needed  But he prefers a cheaper alternative   Try diovan 160/25 one daily for now but defer longterm rx to Dr Clarene Duke

## 2013-04-19 NOTE — Patient Instructions (Signed)
Change the pepcid to take 20 mg after supper and if still coughing add tessalon 200 mg every 4 hours as needed to completely eliminate the urge to clear your throat or cough  Try diovan 160/25 one daily in place of benicar   If you are satisfied with your treatment plan let your doctor know and he/she can either refill your medications or you can return here when your prescription runs out.     If in any way you are not 100% satisfied,  please tell us.  If 100% better, tell your friends!

## 2013-06-05 ENCOUNTER — Encounter: Payer: Self-pay | Admitting: Neurology

## 2013-06-06 ENCOUNTER — Ambulatory Visit (INDEPENDENT_AMBULATORY_CARE_PROVIDER_SITE_OTHER): Payer: Medicare Other | Admitting: Neurology

## 2013-06-06 ENCOUNTER — Encounter (INDEPENDENT_AMBULATORY_CARE_PROVIDER_SITE_OTHER): Payer: Self-pay

## 2013-06-06 ENCOUNTER — Encounter: Payer: Self-pay | Admitting: Neurology

## 2013-06-06 VITALS — BP 122/67 | HR 54 | Ht 69.0 in | Wt 180.0 lb

## 2013-06-06 DIAGNOSIS — R51 Headache: Secondary | ICD-10-CM

## 2013-06-06 DIAGNOSIS — R519 Headache, unspecified: Secondary | ICD-10-CM | POA: Insufficient documentation

## 2013-06-06 HISTORY — DX: Headache: R51

## 2013-06-06 NOTE — Patient Instructions (Signed)
Overall you are doing fairly well but I do want to suggest a few things today:   As far as diagnostic testing:  1)MRI of the brain  Follow up as needed. Please call us with any interim questions, concerns, problems, updates or refill requests.   We will call you to discuss the MRI results when they are available.   My clinical assistant and will answer any of your questions and relay your messages to me and also relay most of my messages to you.   Our phone number is [phone]. We also have an after hours call service for urgent matters and there is a physician on-call for urgent questions. For any emergencies you know to call 911 or go to the nearest emergency room

## 2013-06-06 NOTE — Progress Notes (Signed)
[facility]    Provider:  Dr Hosie Poisson Referring Provider: Catha Gosselin, MD Primary Care Physician:  Mickie Hillier, MD  CC:  headaches  HPI:  Victor Stark is a 72 y.o. male here as a referral from Dr. Clarene Duke for headache  Started 5 weeks ago. Predominately bifrontal, right side more than left.. Described as a dull aching pain that can occasionally be stabbing. +Photophobia. No N/V. No visual changes. No sensation of pressure in sinuses. Comes and goes, typically occuring in the evening while watching TV or lying down. Wakes up with the headache and then the symptoms resolves as the day goes on. Has not woken him up from sleeping. No focal motor or sensory changes. Denies any temporal tenderness, no pain or fatigue with chewing. At worst pain gets to be a 5-6/10. No history of arthritic changes in the neck, no cervical neck pain no head trauma. No falls. He reports starting a statin drug of onset headache started. Reports sleeping overall well, uses trazodone for sleep aid. No snoring no apnea events. He reports having had  ESR checked by his primary care physician, reports it to be unremarkable.  No recent colds, sinus congestions. No allergies. Overall healthy.    Review of Systems: Out of a complete 14 system review, the patient complains of only the following symptoms, and all other reviewed systems are negative. Positive eye pain diarrhea  History   Social History  . Marital Status: Married    Spouse Name: Victor Stark    Number of Children: 2  . Years of Education: college   Occupational History  . Retired-Director of [employer]    Social History Main Topics  . Smoking status: Former Smoker -- 0.75 packs/day for 15 years    Types: Cigarettes    Quit date: 06/20/1992  . Smokeless tobacco: Never Used  . Alcohol Use: Yes     Comment: rare  . Drug Use: No  . Sexual Activity: Not on file   Other Topics Concern  . Not on file   Social History Narrative     Patient is married([name])   Patient has two children.   Patient is retired from [employer].   Patient drinks two servings of coffee and tea daily.    Family History  Problem Relation Age of Onset  . Asthma Sister   . Heart attack Daughter 36    Past Medical History  Diagnosis Date  . Hypertension   . Prostate cancer   . Actinic keratoses     Dr. Charlton Haws  . Hyperlipidemia   . Spondylosis     Dr. Murray Hodgkins  . Ganglion cyst of wrist     left- Dr. Teressa Senter  . Chronic kidney disease, stage III (moderate)     Dr. Nelda Severe  . Melanoma 2013    Dr. Charlton Haws  . Diverticulitis 2012    Past Surgical History  Procedure Laterality Date  . Prostatectomy    . Rotator cuff repair  Jan 2014  . Lumbar disc surgery    . Shoulder surgery      Current Outpatient Prescriptions  Medication Sig Dispense Refill  . Bioflavonoid Products (ESTER C PO) Take 500 mg by mouth daily.      . Cholecalciferol (VITAMIN D) 2000 UNITS CAPS Take 1 capsule by mouth daily.      . Coenzyme Q10 200 MG capsule Take 200 mg by mouth daily.      . CRESTOR 20 MG tablet Take 20 mg by mouth  daily.      . famotidine (PEPCID) 20 MG tablet One at bedtime      . Glucosamine-Chondroitin (GLUCOSAMINE CHONDR COMPLEX PO) Take 1 tablet by mouth 3 (three) times daily.      . Multiple Vitamin (MULTIVITAMIN WITH MINERALS) TABS Take 1 tablet by mouth daily.      Marland Kitchen olmesartan-hydrochlorothiazide (BENICAR HCT) 40-25 MG per tablet One half daily      . TRAZODONE HCL PO Take 1 tablet by mouth daily.      . valACYclovir (VALTREX) 500 MG tablet Take 500 mg by mouth as needed.       . valsartan-hydrochlorothiazide (DIOVAN HCT) 160-25 MG per tablet Take 1 tablet by mouth daily.  30 tablet  11   No current facility-administered medications for this visit.    Allergies as of 06/06/2013 - Review Complete 06/06/2013  Allergen Reaction Noted  . Ace inhibitors  04/19/2013  . Avodart [dutasteride]  06/05/2013  . Lipitor  [atorvastatin]  06/05/2013  . Lisinopril  06/05/2013  . Lodine [etodolac] Hives and Swelling 12/31/2011  . Pravastatin  06/05/2013  . Vicodin [hydrocodone-acetaminophen] Hives and Swelling 12/31/2011  . Amoxicillin Rash 12/31/2011    Vitals: BP 122/67  Pulse 54  Ht 5\' 9"  (1.753 m)  Wt 180 lb (81.647 kg)  BMI 26.57 kg/m2 Last Weight:  Wt Readings from Last 1 Encounters:  06/06/13 180 lb (81.647 kg)   Last Height:   Ht Readings from Last 1 Encounters:  06/06/13 5\' 9"  (1.753 m)     Physical exam: Exam: Gen: NAD, conversant Eyes: anicteric sclerae, moist conjunctivae HENT: Atraumatic, oropharynx clear Neck: Trachea midline; supple,  Lungs: CTA, no wheezing, rales, rhonic                          CV: RRR, no MRG, no carotid bruits Abdomen: Soft, non-tender;  Extremities: No peripheral edema  Skin: Normal temperature, no rash,  Psych: Appropriate affect, pleasant  Neuro: MS: AA&Ox3, appropriately interactive, normal affect   Speech: fluent w/o paraphasic error  Memory: good recent and remote recall  CN: PERRL, EOMI no nystagmus, unable to visualize fundus bilaterally due to pupil size,  Visual fields full to finger count bilaterally,no ptosis, sensation intact to LT V1-V3 bilat, face symmetric, no weakness, hearing grossly intact, palate elevates symmetrically, shoulder shrug 5/5 bilat,  tongue protrudes midline, no fasiculations noted.  Motor: normal bulk and tone Strength: 5/5  In all extremities  Coord: rapid alternating and point-to-point (FNF, HTS) movements intact.  Reflexes: Diminished but symmetrical, bilat downgoing toes  Sens: LT intact in all extremities  Gait: posture, stance, stride and arm-swing normal. Mild difficulty with tandem walk. Able to walk on heels and toes. Romberg absent.   Assessment:  After physical and neurologic examination, review of laboratory studies, imaging, neurophysiology testing and pre-existing records, assessment will be  reviewed on the problem list.  Plan:  Treatment plan and additional workup will be reviewed under Problem List.  1)Headache   72 year old gentleman presenting for initial evaluation of headache, described as bifrontal dull aching pain with occasional stabbing sensation. Worse in the evening when lying down and overnight, improves in the morning with standing and moving around. Physical exam is overall unremarkable. Unclear etiology of headache. Would suspect sinus type headache versus tension type. Based on description of headache being worse in prone position and improving with standing would need to rule out a central structural process, though suspect this is unlikely do  to overall normal physical exam. ESR has been checked to r/o GCA. Will check brain MRI. Patient feels symptoms are mild and does not wish to consider medication at this time. Follow up once MRI completed.   Elspeth Cho, DO  [facility] [address] [place], [place] [address]  Phone [phone] Fax [phone]

## 2013-06-17 ENCOUNTER — Ambulatory Visit
Admission: RE | Admit: 2013-06-17 | Discharge: 2013-06-17 | Disposition: A | Discharge status: Home / Self Care | Payer: Medicare Other | Source: Ambulatory Visit | Attending: Neurology | Admitting: Neurology

## 2013-06-17 DIAGNOSIS — R51 Headache: Secondary | ICD-10-CM

## 2013-06-24 ENCOUNTER — Telehealth: Payer: Self-pay | Admitting: Neurology

## 2013-06-24 NOTE — Telephone Encounter (Signed)
Calling for MRI results and states feel free to share with his wife if he is not home

## 2013-06-26 NOTE — Telephone Encounter (Signed)
Patient calling again to get his MRI brain results.

## 2013-06-26 NOTE — Telephone Encounter (Signed)
Noted  

## 2013-06-26 NOTE — Telephone Encounter (Signed)
I just called him and relayed the results. Thanks.

## 2013-06-26 NOTE — Telephone Encounter (Signed)
Patient called and is very upset that he has not heard anything about his MRI results. Patient kept insisting that I get up and find the doctor for him so that he can speak with him. Please call the patient, he is very anxious about his results.

## 2013-06-27 NOTE — Progress Notes (Signed)
Quick Note:  Shared with patient unremarkable MR brain results per Dr Hazle Quant findings, he verbalized understanding ______

## 2013-09-06 ENCOUNTER — Ambulatory Visit: Payer: Medicare Other | Admitting: Interventional Cardiology

## 2013-09-17 ENCOUNTER — Other Ambulatory Visit: Payer: Self-pay | Admitting: Family Medicine

## 2013-09-17 DIAGNOSIS — R9389 Abnormal findings on diagnostic imaging of other specified body structures: Secondary | ICD-10-CM

## 2013-09-24 ENCOUNTER — Ambulatory Visit
Admission: RE | Admit: 2013-09-24 | Discharge: 2013-09-24 | Disposition: A | Discharge status: Home / Self Care | Payer: Commercial Managed Care - HMO | Source: Ambulatory Visit | Attending: Family Medicine | Admitting: Family Medicine

## 2013-09-24 DIAGNOSIS — R9389 Abnormal findings on diagnostic imaging of other specified body structures: Secondary | ICD-10-CM

## 2013-09-25 ENCOUNTER — Other Ambulatory Visit: Payer: Medicare Other

## 2013-10-21 ENCOUNTER — Ambulatory Visit (INDEPENDENT_AMBULATORY_CARE_PROVIDER_SITE_OTHER): Payer: Commercial Managed Care - HMO | Admitting: Interventional Cardiology

## 2013-10-21 ENCOUNTER — Encounter: Payer: Self-pay | Admitting: Interventional Cardiology

## 2013-10-21 VITALS — BP 111/69 | HR 53 | Ht 69.0 in | Wt 171.0 lb

## 2013-10-21 DIAGNOSIS — I493 Ventricular premature depolarization: Secondary | ICD-10-CM

## 2013-10-21 DIAGNOSIS — I4949 Other premature depolarization: Secondary | ICD-10-CM

## 2013-10-21 DIAGNOSIS — I714 Abdominal aortic aneurysm, without rupture, unspecified: Secondary | ICD-10-CM

## 2013-10-21 DIAGNOSIS — I10 Essential (primary) hypertension: Secondary | ICD-10-CM

## 2013-10-21 HISTORY — DX: Abdominal aortic aneurysm, without rupture, unspecified: I71.40

## 2013-10-21 HISTORY — DX: Abdominal aortic aneurysm, without rupture: I71.4

## 2013-10-21 HISTORY — DX: Ventricular premature depolarization: I49.3

## 2013-10-21 NOTE — Patient Instructions (Signed)
Your physician has requested that you have an abdominal aorta duplex. During this test, an ultrasound is used to evaluate the aorta. Allow 30 minutes for this exam. Do not eat after midnight the day before and avoid carbonated beverages  Your physician recommends that you schedule a follow-up appointment as needed.

## 2013-10-21 NOTE — Progress Notes (Signed)
Patient ID: Victor Stark, male   DOB: 10-30-1940, 73 y.o.   MRN: [id number]    [place], [place],   [address] Phone: [phone] Fax:  [phone]  Date:  10/21/2013   ID:  Victor Stark, DOB July 05, 1940, MRN [id number]  PCP:  Gennette Pac, MD      History of Present Illness: Victor Stark is a 73 y.o. male who had palpitations in 2014.  Usually in the evening, while he is not doing anything. In his lower chest, he feels some irreegularity in his chest intermittently for a few minutes. Does not wake him form sleep. No triggers that he knows of. He has 2 cups of coffee in the morning.  W/u revealed PVCs.  Sx have stopped.  He exercises wtih golf and stretching. He does not do any regular walking. WHen he does walk, no CP. He has had some SHOB if he plays with his grandkids. This is worse, since May 2013, when he had shoulder operation. In December 2013, he had knee surgery for torn cartilage.  No problems in the past year.  AAA diagnosed in 1/14.  No CP, SHOB, plapitations, abdominal pain.      Wt Readings from Last 3 Encounters:  10/21/13 171 lb (77.565 kg)  06/06/13 180 lb (81.647 kg)  04/19/13 179 lb (81.194 kg)     Past Medical History  Diagnosis Date  . Hypertension   . Prostate cancer   . Actinic keratoses     Dr. Rozann Lesches  . Hyperlipidemia   . Spondylosis     Dr. Brien Few  . Ganglion cyst of wrist     left- Dr. Daylene Katayama  . Chronic kidney disease, stage III (moderate)     Dr. Welton Flakes  . Melanoma 2013    Dr. Rozann Lesches  . Diverticulitis 2012    Current Outpatient Prescriptions  Medication Sig Dispense Refill  . Bioflavonoid Products (ESTER C PO) Take 500 mg by mouth daily.      . Cholecalciferol (VITAMIN D) 2000 UNITS CAPS Take 1 capsule by mouth daily.      . Coenzyme Q10 200 MG capsule Take 200 mg by mouth daily.      . CRESTOR 20 MG tablet Take 20 mg by mouth daily.      . famotidine (PEPCID) 20 MG tablet One at bedtime        . gabapentin (NEURONTIN) 300 MG capsule Take 300 mg by mouth at bedtime.       . Glucosamine-Chondroitin (GLUCOSAMINE CHONDR COMPLEX PO) Take 1 tablet by mouth 3 (three) times daily.      . Multiple Vitamin (MULTIVITAMIN WITH MINERALS) TABS Take 1 tablet by mouth daily.      . TRAZODONE HCL PO Take 1 tablet by mouth daily.      . valACYclovir (VALTREX) 500 MG tablet Take 500 mg by mouth as needed.       . valsartan-hydrochlorothiazide (DIOVAN HCT) 160-25 MG per tablet Take 1 tablet by mouth daily.  30 tablet  11   No current facility-administered medications for this visit.    Allergies:    Allergies  Allergen Reactions  . Ace Inhibitors     cough  . Avodart [Dutasteride]     Lack of therapeutic effect  . Lipitor [Atorvastatin]     fatigue  . Lisinopril     Dry cough  . Lodine [Etodolac] Hives and Swelling  . Pravastatin     Muscle aches  .  Vicodin [Hydrocodone-Acetaminophen] Hives and Swelling  . Amoxicillin Rash    Social History:  The patient  reports that he quit smoking about 21 years ago. His smoking use included Cigarettes. He has a 11.25 pack-year smoking history. He has never used smokeless tobacco. He reports that he drinks alcohol. He reports that he does not use illicit drugs.   Family History:  The patient's family history includes Asthma in his sister; Heart attack (age of onset: 30) in his daughter.   ROS:  Please see the history of present illness.  No nausea, vomiting.  No fevers, chills.  No focal weakness.  No dysuria.    All other systems reviewed and negative.   PHYSICAL EXAM: VS:  BP 111/69  Pulse 53  Ht 5\' 9"  (1.753 m)  Wt 171 lb (77.565 kg)  BMI 25.24 kg/m2 Well nourished, well developed, in no acute distress HEENT: normal Neck: no JVD, no carotid bruits Cardiac:  normal S1, S2; bradycardic Lungs:  clear to auscultation bilaterally, no wheezing, rhonchi or rales Abd: soft, nontender, no hepatomegaly Ext: no edema Skin: warm and dry Neuro:   no  focal abnormalities noted  EKG:  Sinus bradycardia, incomplete right bundle branch block, PVC     ASSESSMENT AND PLAN:  1.  AAA/hyperlipidemia: Schedule followup ultrasound to take place in May of 2016. He is artery on the statin.  2.   HTN: Continue aggressive blood pressure control, especially given small aneurysm. ARB is a good choice given this PVD. 3.  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 This was asymptomatic.  No further symptoms. He would not be a good candidate for rate slowing drugs given his bradycardia. Minimize caffeine.  Signed, Mina Marble, MD, [facility] 10/21/2013 4:49 PM

## 2013-11-07 ENCOUNTER — Telehealth: Payer: Self-pay | Admitting: Interventional Cardiology

## 2013-11-07 DIAGNOSIS — R001 Bradycardia, unspecified: Secondary | ICD-10-CM

## 2013-11-07 NOTE — Telephone Encounter (Signed)
New Message:  Pt is requesting to be worked in.. states Dr. Rex Kras, his PcP, faxed a request for him to be seen asap. Dr. Irish Lack does not have any available opening and pt does not want to see the PA... PT is requesting to speak to the nurse to be worked in.. Pt is c/o of dizziness.Marland KitchenMarland Kitchen

## 2013-11-08 MED ORDER — VALSARTAN-HYDROCHLOROTHIAZIDE 160-25 MG PO TABS: ORAL_TABLET | ORAL | Status: DC

## 2013-11-08 NOTE — Telephone Encounter (Signed)
F/u ° ° ° °Pt calling about previous message. Please call pt °

## 2013-11-08 NOTE — Telephone Encounter (Signed)
Per Dr. Irish Lack order 24 hr holter monitor and have pt continue to monitor BP and HR. Spoke with pt and he is agreeable to monitor. He states dizziness is better since Dr. Rex Kras decreased Valsartan-HCTZ to 1/2 tablet.

## 2013-11-08 NOTE — Telephone Encounter (Signed)
Will review Dr. Eddie Dibbles OV note with Dr. Irish Lack this afternoon.

## 2013-11-18 ENCOUNTER — Encounter: Payer: Self-pay | Admitting: *Deleted

## 2013-11-18 ENCOUNTER — Encounter (INDEPENDENT_AMBULATORY_CARE_PROVIDER_SITE_OTHER): Payer: Commercial Managed Care - HMO

## 2013-11-18 DIAGNOSIS — R001 Bradycardia, unspecified: Secondary | ICD-10-CM

## 2013-11-18 DIAGNOSIS — I495 Sick sinus syndrome: Secondary | ICD-10-CM

## 2013-11-18 NOTE — Progress Notes (Signed)
Patient ID: Victor Stark, male   DOB: 08-10-1940, 73 y.o.   MRN: [id number] EVO 24 hour holter monitor applied to patient.

## 2013-11-22 ENCOUNTER — Telehealth: Payer: Self-pay | Admitting: Interventional Cardiology

## 2013-11-22 NOTE — Telephone Encounter (Signed)
New message    Calling for test results  

## 2013-11-22 NOTE — Telephone Encounter (Signed)
Victor Stark is checking to see if heart monitor results are back.

## 2013-11-22 NOTE — Telephone Encounter (Signed)
Called pt and he is aware that results are not back yet.

## 2013-11-26 ENCOUNTER — Telehealth: Payer: Self-pay | Admitting: Interventional Cardiology

## 2013-11-26 NOTE — Telephone Encounter (Signed)
New problem   Pt want results of his heart monitor. Please call pt.

## 2013-11-26 NOTE — Telephone Encounter (Signed)
Pt notified. Pt state he feels that dizziness is better since decreasing valsartan-hctz to 1/2 tablet. Pt states he still has lightheadedness and dizziness almost everyday, which can last 1-2 hours. Pt doesn't feel that he could pass out. Pts BP is running 160-120's/80's.

## 2013-11-26 NOTE — Telephone Encounter (Signed)
Dr. Hassell Done Interpretationof 24 hr holter: NSR, PVC's. Single Triplet. Normal EF in 2014.

## 2013-11-28 MED ORDER — VALSARTAN 160 MG PO TABS: 160.0000 mg | ORAL_TABLET | Freq: Every day | ORAL | Status: DC

## 2013-11-28 NOTE — Telephone Encounter (Signed)
Pt notified, rx sent in.

## 2013-11-28 NOTE — Telephone Encounter (Signed)
Could try current dose of valsartan without HCTZ to see if dizziness improves. Continue to monitor blood pressure.

## 2013-12-11 ENCOUNTER — Encounter: Payer: Self-pay | Admitting: Interventional Cardiology

## 2013-12-26 ENCOUNTER — Telehealth: Payer: Self-pay | Admitting: Cardiology

## 2013-12-26 NOTE — Telephone Encounter (Addendum)
Spoke with pt and he is taking Valsartan 160 mg alternating with Valsartan-hct 160-12.5 mg qod. Pt is doing fine on this regimen with no dizziness. Can he stay on this regimen or should he stop the valsartan-hct and just stay on plain valsartan everyday?

## 2013-12-26 NOTE — Telephone Encounter (Signed)
-----   Message ----- From: [name] Sent: 12/25/2013 9:50 AM To: [facility] [address] Triage Subject: Questionnaire Submission Patient Questionnaire Submission --------------------------------  Questionnaire:   Questionnaire Question: How are you feeling after your recent visit? Answer: fine   Question:  Does the recommended course of treatment seem to be helping your symptoms? Answer: not sure   Question: Are you experiencing any side effects from your recommended treatment? Answer: no   Question: is there anything else you would like to ask your physician? Answer: no dizziness even with the HCTZ I assume I should go back to taking it on a regular basis

## 2013-12-29 NOTE — Telephone Encounter (Signed)
Ok to stay on this regimen

## 2013-12-30 MED ORDER — VALSARTAN-HYDROCHLOROTHIAZIDE 160-12.5 MG PO TABS: ORAL_TABLET | ORAL | Status: DC

## 2013-12-30 MED ORDER — VALSARTAN 160 MG PO TABS: ORAL_TABLET | ORAL | Status: DC

## 2013-12-30 NOTE — Telephone Encounter (Signed)
Pt notified. Meds updated.

## 2013-12-30 NOTE — Addendum Note (Signed)
Addended byUlla Potash H on: 12/30/2013 11:14 AM   Modules accepted: Orders

## 2014-01-13 ENCOUNTER — Ambulatory Visit: Payer: Commercial Managed Care - HMO | Admitting: Physician Assistant

## 2014-01-20 ENCOUNTER — Ambulatory Visit (INDEPENDENT_AMBULATORY_CARE_PROVIDER_SITE_OTHER): Payer: Commercial Managed Care - HMO | Admitting: Physician Assistant

## 2014-01-20 ENCOUNTER — Encounter: Payer: Self-pay | Admitting: Physician Assistant

## 2014-01-20 VITALS — BP 110/58 | HR 46 | Ht 69.0 in | Wt 174.0 lb

## 2014-01-20 DIAGNOSIS — R001 Bradycardia, unspecified: Secondary | ICD-10-CM

## 2014-01-20 DIAGNOSIS — I714 Abdominal aortic aneurysm, without rupture, unspecified: Secondary | ICD-10-CM

## 2014-01-20 DIAGNOSIS — I10 Essential (primary) hypertension: Secondary | ICD-10-CM

## 2014-01-20 DIAGNOSIS — I498 Other specified cardiac arrhythmias: Secondary | ICD-10-CM

## 2014-01-20 MED ORDER — VALSARTAN-HYDROCHLOROTHIAZIDE 160-12.5 MG PO TABS: ORAL_TABLET | ORAL | Status: DC

## 2014-01-20 NOTE — Progress Notes (Addendum)
Cardiology Office Note    Date:  01/20/2014   ID:  Victor Stark, DOB 04-12-1941, MRN [id number]  PCP:  Victor Pac, MD  Cardiologist:  Dr. Casandra Stark      History of Present Illness: Victor Stark is a 73 y.o. male with a history of HTN, HL, prostate CA, CKD, AAA. Last seen by Dr. Casandra Stark 10/2013.  Saw PCP in 10/2013 for dizziness.  Referred back for bradycardia.  BP medications were adjusted some to see if this would help.  He changed to Valsartan 160 QD, then Valsartan/HCTZ 160/12.5 mg QOD alt with Valsartan 160 QD.  BP tended to run higher.  Now, back on Valsartan/HCTZ 160/12.5 QD.  BP optimal.  No further dizziness.  He has a hx of vertigo.  He says his symptoms were similar.  He did call our office and was placed on a 24 Hr Holter.  This demonstrated NSR, PVCs, one triplet.  No significant arrhythmias.  He does feel fatigue with certain activities.  Otherwise, denies chest pain, dyspnea, orthopnea, PND, edema, syncope.    Studies:  - Echo (2/14):  EF 60-65%, mild asymmetric septal hypertrophy, trace MR, aortic sclerosis  - Event Monitor (08/2012): PVCs, NSR, no significant arrhythmia   Recent Labs/Images: No results found for requested labs within last 365 days.  Ct Chest Wo Contrast   09/24/2013     IMPRESSION: 1. Area of bandlike opacity in the left upper lobe lingula is unchanged. This is consistent with an area postinflammatory scarring with associated chronic atelectasis. It needs no further evaluation. 2. No acute findings.  No evidence of malignancy. 3. No change from the prior study.   Electronically Signed   By: Victor Stark M.D.   On: 09/24/2013 16:35     Abdominal US (06/2012):   IMPRESSION:  Small distal abdominal aortic aneurysm of 3.1 x 3.0 cm.  Recommendations for follow-up are given above. - f/u 3 years.   Wt Readings from Last 3 Encounters:  10/21/13 171 lb (77.565 kg)  06/06/13 180 lb (81.647 kg)  04/19/13 179 lb (81.194 kg)     Past Medical  History  Diagnosis Date  . Hypertension   . Prostate cancer   . Actinic keratoses     Dr. Rozann Stark  . Hyperlipidemia   . Spondylosis     Dr. Brien Stark  . Ganglion cyst of wrist     left- Dr. Daylene Stark  . Chronic kidney disease, stage III (moderate)     Dr. Welton Stark  . Melanoma 2013    Dr. Rozann Stark  . Diverticulitis 2012    Current Outpatient Prescriptions  Medication Sig Dispense Refill  . Bioflavonoid Products (ESTER C PO) Take 500 mg by mouth daily.      . Cholecalciferol (VITAMIN D) 2000 UNITS CAPS Take 1 capsule by mouth daily.      . Coenzyme Q10 200 MG capsule Take 200 mg by mouth daily.      . CRESTOR 20 MG tablet Take 20 mg by mouth daily.      . famotidine (PEPCID) 20 MG tablet One at bedtime      . gabapentin (NEURONTIN) 300 MG capsule Take 300 mg by mouth at bedtime.       . Glucosamine-Chondroitin (GLUCOSAMINE CHONDR COMPLEX PO) Take 1 tablet by mouth 3 (three) times daily.      . metroNIDAZOLE (FLAGYL) 500 MG tablet       . Multiple Vitamin (MULTIVITAMIN WITH MINERALS) TABS Take 1 tablet by  mouth daily.      . TRAZODONE HCL PO Take 1 tablet by mouth daily.      . valACYclovir (VALTREX) 500 MG tablet Take 500 mg by mouth as needed.       . valsartan (DIOVAN) 160 MG tablet Alternating qod with Valsartan 160-12.5 mg  30 tablet  3  . valsartan-hydrochlorothiazide (DIOVAN HCT) 160-12.5 MG per tablet Alternating qod with Valsartan 160 mg       No current facility-administered medications for this visit.     Allergies:   Ace inhibitors; Avodart; Lipitor; Lisinopril; Lodine; Pravastatin; Vicodin; and Amoxicillin   Social History:  The patient  reports that he quit smoking about 21 years ago. His smoking use included Cigarettes. He has a 11.25 pack-year smoking history. He has never used smokeless tobacco. He reports that he drinks alcohol. He reports that he does not use illicit drugs.   Family History:  The patient's family history includes Asthma in his sister; Heart attack  (age of onset: 59) in his daughter.   ROS:  Please see the history of present illness.      All other systems reviewed and negative.   PHYSICAL EXAM: VS:  BP 110/58  Pulse 46  Ht 5\' 9"  (1.753 m)  Wt 174 lb (78.926 kg)  BMI 25.68 kg/m2 Well nourished, well developed, in no acute distress HEENT: normal Neck: no JVD Cardiac:  normal S1, S2; RRR; no murmur Lungs:  clear to auscultation bilaterally, no wheezing, rhonchi or rales Abd: soft, nontender, no hepatomegaly Ext: no edema Skin: warm and dry Neuro:  CNs 2-12 intact, no focal abnormalities noted  EKG:  Sinus brady, HR 46, normal axis, inc RBBB, no changes      ASSESSMENT AND PLAN:  Essential hypertension:  Controlled.  Continue current regimen. Sounds like recent episode of dizziness may be from vertigo.  If recurs and BP runs low, I would consider changing dose to Valsartan/HCTZ 80/12.5 mg QD.    Bradycardia:  He is concerned about CAD with FHx.  Will get ETT to rule out chronotropic incompetence. Will also screen for CAD.   AAA (abdominal aortic aneurysm):  F/u US scheduled in 2016.   Disposition:  F/u with Dr. Casandra Stark in 10/2014.   Signed, Victor Stark, MHS 01/20/2014 9:16 AM    [facility] [place], [place], [place]  [address] Phone: [phone]; Fax: [phone]

## 2014-01-20 NOTE — Patient Instructions (Signed)
.  Your physician has requested that you have an exercise tolerance test. For further information please visit [email or web address]. Please also follow instruction sheet, as given.   Your physician wants you to follow-up in: [place] DR. VARANASI. You will receive a reminder letter in the mail two months in advance. If you don't receive a letter, please call our office to schedule the follow-up appointment.

## 2014-02-28 ENCOUNTER — Ambulatory Visit (INDEPENDENT_AMBULATORY_CARE_PROVIDER_SITE_OTHER): Payer: Commercial Managed Care - HMO | Admitting: Physician Assistant

## 2014-02-28 DIAGNOSIS — R9439 Abnormal result of other cardiovascular function study: Secondary | ICD-10-CM

## 2014-02-28 DIAGNOSIS — R5383 Other fatigue: Secondary | ICD-10-CM

## 2014-02-28 DIAGNOSIS — I498 Other specified cardiac arrhythmias: Secondary | ICD-10-CM

## 2014-02-28 DIAGNOSIS — R001 Bradycardia, unspecified: Secondary | ICD-10-CM

## 2014-02-28 NOTE — Progress Notes (Signed)
Exercise Treadmill Test  Pre-Exercise Testing Evaluation Rhythm: sinus bradycardia  Rate: 47 bpm     Test  Exercise Tolerance Test Ordering MD: Casandra Doffing, MD  Interpreting MD: Richardson Dopp, PA-C  Unique Test No: 1  Treadmill:  1  Indication for ETT: bradycardia  Contraindication to ETT: No   Stress Modality: exercise - treadmill  Cardiac Imaging Performed: non   Protocol: standard Bruce - maximal  Max BP:  203/61  Max MPHR (bpm):  146 85% MPR (bpm):  124  MPHR obtained (bpm):  105 % MPHR obtained:  71  Reached 85% MPHR (min:sec):  n/a Total Exercise Time (min-sec):  8:18  Workload in METS:  10.1 Borg Scale: 16  Reason ETT Terminated:  patient's desire to stop    ST Segment Analysis At Rest: normal ST segments - no evidence of significant ST depression With Exercise: non-specific ST changes  Other Information Arrhythmia:  Frequent PVCs Angina during ETT:  absent (0) Quality of ETT:  non-diagnostic  ETT Interpretation:  No ST depression at submaximal exercise.  Comments: Good exercise capacity. No chest pain. Normal BP response to exercise. No significant ST changes at sub-maximal exercise to suggest ischemia.  Frequent PVCs noted throughout exercise (asymptomatic).  Recommendations: Patient presented with complaints of fatigue and has multiple cardiac risk factors. Will arrange Lexiscan Myoview to rule out ischemia. Of note, patient did have good HR increase with activity (47 >> 105). Signed,  Richardson Dopp, PA-C   02/28/2014 9:56 AM

## 2014-03-12 ENCOUNTER — Ambulatory Visit (HOSPITAL_COMMUNITY): Payer: Medicare HMO | Attending: Physician Assistant | Admitting: Radiology

## 2014-03-12 VITALS — BP 120/69 | Ht 69.0 in | Wt 175.0 lb

## 2014-03-12 DIAGNOSIS — R9439 Abnormal result of other cardiovascular function study: Secondary | ICD-10-CM

## 2014-03-12 DIAGNOSIS — R5383 Other fatigue: Secondary | ICD-10-CM | POA: Diagnosis not present

## 2014-03-12 DIAGNOSIS — R002 Palpitations: Secondary | ICD-10-CM | POA: Insufficient documentation

## 2014-03-12 DIAGNOSIS — I4510 Unspecified right bundle-branch block: Secondary | ICD-10-CM | POA: Diagnosis not present

## 2014-03-12 DIAGNOSIS — I714 Abdominal aortic aneurysm, without rupture, unspecified: Secondary | ICD-10-CM | POA: Diagnosis not present

## 2014-03-12 DIAGNOSIS — I10 Essential (primary) hypertension: Secondary | ICD-10-CM | POA: Insufficient documentation

## 2014-03-12 DIAGNOSIS — R001 Bradycardia, unspecified: Secondary | ICD-10-CM

## 2014-03-12 DIAGNOSIS — R5381 Other malaise: Secondary | ICD-10-CM | POA: Diagnosis not present

## 2014-03-12 DIAGNOSIS — I498 Other specified cardiac arrhythmias: Secondary | ICD-10-CM

## 2014-03-12 DIAGNOSIS — R42 Dizziness and giddiness: Secondary | ICD-10-CM | POA: Diagnosis present

## 2014-03-12 MED ORDER — TECHNETIUM TC 99M SESTAMIBI GENERIC - CARDIOLITE
11.0000 | Freq: Once | INTRAVENOUS | Status: AC | PRN
  Administered 2014-03-12: 11 via INTRAVENOUS

## 2014-03-12 MED ORDER — REGADENOSON 0.4 MG/5ML IV SOLN
0.4000 mg | Freq: Once | INTRAVENOUS | Status: AC
  Administered 2014-03-12: 0.4 mg via INTRAVENOUS

## 2014-03-12 MED ORDER — TECHNETIUM TC 99M SESTAMIBI GENERIC - CARDIOLITE
33.0000 | Freq: Once | INTRAVENOUS | Status: AC | PRN
  Administered 2014-03-12: 33 via INTRAVENOUS

## 2014-03-12 NOTE — Progress Notes (Signed)
[facility] [address] [place], [place] [address] [phone]    Cardiology Nuclear Med Study  Victor Stark is a 73 y.o. male     MRN : [id number]     DOB: [birth year]  Procedure Date: 03/12/2014  Nuclear Med Background Indication for Stress Test:  Evaluation for Ischemia, and 02-28-2014 GXT: Nonspecific ST changes and Non-Diagnostic due to unable to reach target heart rate History:  n/a Cardiac Risk Factors: Hypertension, IRBBB, and AAA 3.1cm Symptoms:  Dizziness, Fatigue and Palpitations   Nuclear Pre-Procedure Caffeine/Decaff Intake:  None> 12 hrs NPO After: 9:00pm   Lungs:  clear O2 Sat: 97% on room air. IV 0.9% NS with Angio Cath:  20g  IV Site: R Antecubital x 1, tolerated well IV Started by:  Irven Baltimore, RN  Chest Size (in):  40 Cup Size: n/a  Height: 5\' 9"  (1.753 m)  Weight:  175 lb (79.379 kg)  BMI:  Body mass index is 25.83 kg/(m^2). Tech Comments:  Patient took am medications. Irven Baltimore, RN.    Nuclear Med Study 1 or 2 day study: 1 day  Stress Test Type:  Carlton Adam  Reading MD: N/A  Order Authorizing Provider:  Larae Grooms, MD, and Richardson Dopp, PAC.  Resting Radionuclide: Technetium 17m Sestamibi  Resting Radionuclide Dose: 11.0 mCi   Stress Radionuclide:  Technetium 73m Sestamibi  Stress Radionuclide Dose: 33.0 mCi           Stress Protocol Rest HR: 38 Stress HR: 70  Rest BP: 120/69 Stress BP: 123/62  Exercise Time (min): n/a METS: n/a   Predicted Max HR: 146 bpm % Max HR: 47.95 bpm Rate Pressure Product: 8610   Dose of Adenosine (mg):  n/a Dose of Lexiscan: 0.4 mg  Dose of Atropine (mg): n/a Dose of Dobutamine: n/a mcg/kg/min (at max HR)  Stress Test Technologist: Perrin Maltese, EMT-P  Nuclear Technologist:  Earl Many, CNMT     Rest Procedure:  Myocardial perfusion imaging was performed at rest 45 minutes following the intravenous administration of Technetium 44m Sestamibi. Rest ECG:  NSR-RBBB  Stress Procedure:  The patient received IV Lexiscan 0.4 mg over 15-seconds.  Technetium 65m Sestamibi injected at 30-seconds. This patient had abdominal pain, nausea, and felt woozy with the Lexiscan injection.  Quantitative spect images were obtained after a 45 minute delay. Stress ECG: No significant change from baseline ECG  QPS Raw Data Images:  Patient motion noted. Stress Images:  Normal homogeneous uptake in all areas of the myocardium. Rest Images:  Normal homogeneous uptake in all areas of the myocardium. Subtraction (SDS):  No evidence of ischemia. Transient Ischemic Dilatation (Normal <1.22):  1.11 Lung/Heart Ratio (Normal <0.45):  0.33  Quantitative Gated Spect Images QGS EDV:  142 ml QGS ESV:  72 ml  Impression Exercise Capacity:  Lexiscan with no exercise. BP Response:  Normal blood pressure response. Clinical Symptoms:  Abdominal pain and Woozy ECG Impression:  No significant ST segment change suggestive of ischemia. Comparison with Prior Nuclear Study: No images to compare  Overall Impression:  Normal stress nuclear study. See note regarding EF   LV Ejection Fraction: 49%.  LV Wall Motion:  Looks normal with no discrete RWMA but quantitates mildly decreased      [name]

## 2014-03-13 ENCOUNTER — Encounter: Payer: Self-pay | Admitting: Physician Assistant

## 2014-03-13 ENCOUNTER — Telehealth: Payer: Self-pay | Admitting: Physician Assistant

## 2014-03-13 ENCOUNTER — Telehealth: Payer: Self-pay | Admitting: *Deleted

## 2014-03-13 NOTE — Telephone Encounter (Signed)
New message   Patient returning call back to nurse Arbie Cookey

## 2014-03-13 NOTE — Telephone Encounter (Signed)
lmom normal myoview; if any question can call [phone]

## 2014-03-13 NOTE — Telephone Encounter (Signed)
pt notified about normal myoview results with verbal understanding 

## 2014-04-10 ENCOUNTER — Other Ambulatory Visit: Payer: Self-pay | Admitting: Physical Medicine and Rehabilitation

## 2014-04-10 DIAGNOSIS — M5416 Radiculopathy, lumbar region: Secondary | ICD-10-CM

## 2014-04-10 DIAGNOSIS — M129 Arthropathy, unspecified: Secondary | ICD-10-CM

## 2014-04-20 ENCOUNTER — Ambulatory Visit
Admission: RE | Admit: 2014-04-20 | Discharge: 2014-04-20 | Disposition: A | Discharge status: Home / Self Care | Payer: Medicare HMO | Source: Ambulatory Visit | Attending: Physical Medicine and Rehabilitation | Admitting: Physical Medicine and Rehabilitation

## 2014-04-20 DIAGNOSIS — M5416 Radiculopathy, lumbar region: Secondary | ICD-10-CM

## 2014-04-20 DIAGNOSIS — M129 Arthropathy, unspecified: Secondary | ICD-10-CM

## 2014-10-29 ENCOUNTER — Ambulatory Visit (INDEPENDENT_AMBULATORY_CARE_PROVIDER_SITE_OTHER): Payer: Commercial Managed Care - HMO | Admitting: Interventional Cardiology

## 2014-10-29 ENCOUNTER — Encounter: Payer: Self-pay | Admitting: Interventional Cardiology

## 2014-10-29 VITALS — BP 130/70 | HR 57 | Ht 69.0 in | Wt 179.1 lb

## 2014-10-29 DIAGNOSIS — I10 Essential (primary) hypertension: Secondary | ICD-10-CM | POA: Diagnosis not present

## 2014-10-29 DIAGNOSIS — I714 Abdominal aortic aneurysm, without rupture, unspecified: Secondary | ICD-10-CM

## 2014-10-29 DIAGNOSIS — R001 Bradycardia, unspecified: Secondary | ICD-10-CM

## 2014-10-29 HISTORY — DX: Bradycardia, unspecified: R00.1

## 2014-10-29 NOTE — Patient Instructions (Addendum)
Medication Instructions:  Your physician recommends that you continue on your current medications as directed. Please refer to the Current Medication list given to you today.   Labwork: NONE  Testing/Procedures: Your physician has requested that you have an abdominal aorta duplex. During this test, an ultrasound is used to evaluate the aorta. Allow 30 minutes for this exam. Do not eat after midnight the day before and avoid carbonated beverages   Follow-Up: Your physician wants you to follow-up in: 12 months with Dr. Irish Lack. You will receive a reminder letter in the mail two months in advance. If you don't receive a letter, please call our office to schedule the follow-up appointment.   Any Other Special Instructions Will Be Listed Below (If Applicable).

## 2014-10-29 NOTE — Progress Notes (Signed)
Patient ID: Victor Stark, male   DOB: 22-Sep-1940, 74 y.o.   MRN: [id number]     Cardiology Office Note   Date:  10/29/2014   ID:  Victor Stark, DOB Apr 12, 1941, MRN [id number]  PCP:  Gennette Pac, MD    No chief complaint on file. f/u HTN   Wt Readings from Last 3 Encounters:  10/29/14 179 lb 1.9 oz (81.248 kg)  04/20/14 170 lb (77.111 kg)  03/12/14 175 lb (79.379 kg)       History of Present Illness: Victor Stark is a 74 y.o. male  who is had hypertension. He is also had bradycardia. He was evaluated with an exercise treadmill test in late 2015. He did not achieve target heart rate. He then underwent a nuclear stress test showing no evidence of ischemia. Since that time, he has done quite well. No chest discomfort or shortness of breath. He plays golf. He does not do much more exercise than that. He does play with his grandkids. He denies any chest discomfort, shortness of breath or orthopnea.  No swelling, lightheadedness, syncope.    Past Medical History  Diagnosis Date  . Hypertension   . Prostate cancer   . Actinic keratoses     Dr. Rozann Lesches  . Hyperlipidemia   . Spondylosis     Dr. Brien Few  . Ganglion cyst of wrist     left- Dr. Daylene Katayama  . Chronic kidney disease, stage III (moderate)     Dr. Welton Flakes  . Melanoma 2013    Dr. Rozann Lesches  . Diverticulitis 2012  . Hx of cardiovascular stress test     LexiScan Myoview (9/15):  Normal study, EF 49% (visually looks normal; quantifies mildly decreased).      Past Surgical History  Procedure Laterality Date  . Prostatectomy    . Rotator cuff repair  Jan 2014  . Lumbar disc surgery    . Shoulder surgery       Current Outpatient Prescriptions  Medication Sig Dispense Refill  . Bioflavonoid Products (ESTER C PO) Take 500 mg by mouth daily.    . Cholecalciferol (VITAMIN D) 2000 UNITS CAPS Take 1 capsule by mouth daily.    . Coenzyme Q10 200 MG capsule Take 200 mg by mouth daily.    . CRESTOR 20 MG  tablet Take 20 mg by mouth daily.    . Multiple Vitamin (MULTIVITAMIN WITH MINERALS) TABS Take 1 tablet by mouth daily.    . TRAZODONE HCL PO Take 50 mg by mouth at bedtime.     . valACYclovir (VALTREX) 500 MG tablet Take 500 mg by mouth as needed.     . valsartan-hydrochlorothiazide (DIOVAN HCT) 160-12.5 MG per tablet 1 tab daily     No current facility-administered medications for this visit.    Allergies:   Ace inhibitors; Avodart; Lipitor; Lisinopril; Lodine; Pravastatin; Vicodin; and Amoxicillin    Social History:  The patient  reports that he quit smoking about 22 years ago. His smoking use included Cigarettes. He has a 11.25 pack-year smoking history. He has never used smokeless tobacco. He reports that he drinks alcohol. He reports that he does not use illicit drugs.   Family History:  The patient's family history includes Asthma in his sister; Heart attack (age of onset: 6) in his daughter.    ROS:  Please see the history of present illness.   .   All other systems are reviewed and negative.    PHYSICAL EXAM: VS:  BP 130/70  mmHg  Pulse 57  Ht 5\' 9"  (1.753 m)  Wt 179 lb 1.9 oz (81.248 kg)  BMI 26.44 kg/m2  SpO2 98% , BMI Body mass index is 26.44 kg/(m^2). GEN: Well nourished, well developed, in no acute distress HEENT: normal Neck: no JVD, carotid bruits, or masses Cardiac: RRR; no murmurs, rubs, or gallops,no edema  Respiratory:  clear to auscultation bilaterally, normal work of breathing GI: soft, nontender, nondistended, + BS MS: no deformity or atrophy Skin: warm and dry, no rash Neuro:  Strength and sensation are intact Psych: euthymic mood, full affect     Recent Labs: No results found for requested labs within last 365 days.   Lipid Panel No results found for: CHOL, TRIG, HDL, CHOLHDL, VLDL, LDLCALC, LDLDIRECT   Other studies Reviewed: Additional studies/ records that were reviewed today with results demonstrating: Nuclear stress test negative for  ischemia.   ASSESSMENT AND PLAN:  Essential hypertension: Controlled. Continue current regimen.    Bradycardia: No symptoms of bradycardia. He is able to increase his heart rate with exercise.Marland Kitchen   AAA (abdominal aortic aneurysm): F/u US due for small aneurysm noted in 2014. We'll schedule.   Current medicines are reviewed at length with the patient today.  The patient concerns regarding his medicines were addressed.  The following changes have been made:  No change  Labs/ tests ordered today include: Ultrasound  No orders of the defined types were placed in this encounter.    Recommend 150 minutes/week of aerobic exercise Low fat, low carb, high fiber diet recommended  Disposition:   FU in one year   [name]., MD  10/29/2014 6:09 PM    [facility] [place], [place], [place]  [address] Phone: [phone]; Fax: [phone]

## 2014-11-13 ENCOUNTER — Other Ambulatory Visit (HOSPITAL_COMMUNITY): Payer: Commercial Managed Care - HMO

## 2014-11-21 ENCOUNTER — Ambulatory Visit (HOSPITAL_COMMUNITY): Payer: Commercial Managed Care - HMO | Attending: Internal Medicine

## 2014-11-21 DIAGNOSIS — I714 Abdominal aortic aneurysm, without rupture, unspecified: Secondary | ICD-10-CM

## 2015-08-05 DIAGNOSIS — Z1283 Encounter for screening for malignant neoplasm of skin: Secondary | ICD-10-CM | POA: Diagnosis not present

## 2015-08-05 DIAGNOSIS — Z08 Encounter for follow-up examination after completed treatment for malignant neoplasm: Secondary | ICD-10-CM | POA: Diagnosis not present

## 2015-08-05 DIAGNOSIS — X32XXXD Exposure to sunlight, subsequent encounter: Secondary | ICD-10-CM | POA: Diagnosis not present

## 2015-08-05 DIAGNOSIS — L570 Actinic keratosis: Secondary | ICD-10-CM | POA: Diagnosis not present

## 2015-08-05 DIAGNOSIS — L820 Inflamed seborrheic keratosis: Secondary | ICD-10-CM | POA: Diagnosis not present

## 2015-08-05 DIAGNOSIS — Z85820 Personal history of malignant melanoma of skin: Secondary | ICD-10-CM | POA: Diagnosis not present

## 2015-08-14 ENCOUNTER — Other Ambulatory Visit: Payer: Self-pay | Admitting: Family Medicine

## 2015-08-14 DIAGNOSIS — Z8546 Personal history of malignant neoplasm of prostate: Secondary | ICD-10-CM | POA: Diagnosis not present

## 2015-08-14 DIAGNOSIS — I714 Abdominal aortic aneurysm, without rupture, unspecified: Secondary | ICD-10-CM

## 2015-08-14 DIAGNOSIS — J309 Allergic rhinitis, unspecified: Secondary | ICD-10-CM | POA: Diagnosis not present

## 2015-08-14 DIAGNOSIS — N183 Chronic kidney disease, stage 3 (moderate): Secondary | ICD-10-CM | POA: Diagnosis not present

## 2015-08-14 DIAGNOSIS — I10 Essential (primary) hypertension: Secondary | ICD-10-CM | POA: Diagnosis not present

## 2015-08-14 DIAGNOSIS — E782 Mixed hyperlipidemia: Secondary | ICD-10-CM | POA: Diagnosis not present

## 2015-08-14 DIAGNOSIS — Z0000 Encounter for general adult medical examination without abnormal findings: Secondary | ICD-10-CM | POA: Diagnosis not present

## 2015-08-14 DIAGNOSIS — L570 Actinic keratosis: Secondary | ICD-10-CM | POA: Diagnosis not present

## 2015-08-14 DIAGNOSIS — R7301 Impaired fasting glucose: Secondary | ICD-10-CM | POA: Diagnosis not present

## 2015-08-27 ENCOUNTER — Other Ambulatory Visit: Payer: Self-pay | Admitting: Family Medicine

## 2015-08-27 DIAGNOSIS — I2510 Atherosclerotic heart disease of native coronary artery without angina pectoris: Secondary | ICD-10-CM

## 2015-08-28 ENCOUNTER — Other Ambulatory Visit: Payer: Commercial Managed Care - HMO

## 2015-09-01 ENCOUNTER — Ambulatory Visit
Admission: RE | Admit: 2015-09-01 | Discharge: 2015-09-01 | Disposition: A | Discharge status: Home / Self Care | Payer: Medicare HMO | Source: Ambulatory Visit | Attending: Family Medicine | Admitting: Family Medicine

## 2015-09-01 DIAGNOSIS — I6523 Occlusion and stenosis of bilateral carotid arteries: Secondary | ICD-10-CM | POA: Diagnosis not present

## 2015-09-01 DIAGNOSIS — I2510 Atherosclerotic heart disease of native coronary artery without angina pectoris: Secondary | ICD-10-CM

## 2015-09-15 DIAGNOSIS — C61 Malignant neoplasm of prostate: Secondary | ICD-10-CM | POA: Diagnosis not present

## 2015-09-18 DIAGNOSIS — R42 Dizziness and giddiness: Secondary | ICD-10-CM | POA: Diagnosis not present

## 2015-09-18 DIAGNOSIS — H903 Sensorineural hearing loss, bilateral: Secondary | ICD-10-CM | POA: Diagnosis not present

## 2015-09-22 DIAGNOSIS — Z0000 Encounter for general adult medical examination without abnormal findings: Secondary | ICD-10-CM | POA: Diagnosis not present

## 2015-09-22 DIAGNOSIS — C61 Malignant neoplasm of prostate: Secondary | ICD-10-CM | POA: Diagnosis not present

## 2015-09-22 DIAGNOSIS — N5201 Erectile dysfunction due to arterial insufficiency: Secondary | ICD-10-CM | POA: Diagnosis not present

## 2015-10-12 DIAGNOSIS — K5900 Constipation, unspecified: Secondary | ICD-10-CM | POA: Diagnosis not present

## 2015-10-12 DIAGNOSIS — R109 Unspecified abdominal pain: Secondary | ICD-10-CM | POA: Diagnosis not present

## 2015-11-04 DIAGNOSIS — Z1283 Encounter for screening for malignant neoplasm of skin: Secondary | ICD-10-CM | POA: Diagnosis not present

## 2015-11-04 DIAGNOSIS — Z08 Encounter for follow-up examination after completed treatment for malignant neoplasm: Secondary | ICD-10-CM | POA: Diagnosis not present

## 2015-11-04 DIAGNOSIS — Z85820 Personal history of malignant melanoma of skin: Secondary | ICD-10-CM | POA: Diagnosis not present

## 2015-11-06 ENCOUNTER — Ambulatory Visit: Payer: Commercial Managed Care - HMO | Admitting: Interventional Cardiology

## 2015-11-10 DIAGNOSIS — M25512 Pain in left shoulder: Secondary | ICD-10-CM | POA: Diagnosis not present

## 2015-11-10 DIAGNOSIS — M25521 Pain in right elbow: Secondary | ICD-10-CM | POA: Diagnosis not present

## 2015-12-03 DIAGNOSIS — M26609 Unspecified temporomandibular joint disorder, unspecified side: Secondary | ICD-10-CM | POA: Diagnosis not present

## 2015-12-10 ENCOUNTER — Encounter: Payer: Self-pay | Admitting: Interventional Cardiology

## 2016-01-13 ENCOUNTER — Ambulatory Visit: Payer: Medicare HMO | Admitting: Interventional Cardiology

## 2016-01-14 ENCOUNTER — Encounter: Payer: Self-pay | Admitting: Interventional Cardiology

## 2016-01-14 ENCOUNTER — Ambulatory Visit (INDEPENDENT_AMBULATORY_CARE_PROVIDER_SITE_OTHER): Payer: Medicare HMO | Admitting: Interventional Cardiology

## 2016-01-14 VITALS — BP 119/65 | HR 44 | Ht 69.0 in | Wt 174.1 lb

## 2016-01-14 DIAGNOSIS — I714 Abdominal aortic aneurysm, without rupture, unspecified: Secondary | ICD-10-CM

## 2016-01-14 DIAGNOSIS — IMO0001 Reserved for inherently not codable concepts without codable children: Secondary | ICD-10-CM

## 2016-01-14 DIAGNOSIS — I10 Essential (primary) hypertension: Secondary | ICD-10-CM | POA: Diagnosis not present

## 2016-01-14 DIAGNOSIS — I700 Atherosclerosis of aorta: Secondary | ICD-10-CM

## 2016-01-14 DIAGNOSIS — R001 Bradycardia, unspecified: Secondary | ICD-10-CM | POA: Diagnosis not present

## 2016-01-14 HISTORY — DX: Reserved for inherently not codable concepts without codable children: IMO0001

## 2016-01-14 NOTE — Patient Instructions (Signed)
Medication Instructions:  Same-no changes  Labwork: None  Testing/Procedures: Aortic US  Follow-Up: Your physician wants you to follow-up in: 1 year. You will receive a reminder letter in the mail two months in advance. If you don't receive a letter, please call our office to schedule the follow-up appointment.        If you need a refill on your cardiac medications before your next appointment, please call your pharmacy.

## 2016-01-14 NOTE — Progress Notes (Signed)
Cardiology Office Note   Date:  01/14/2016   ID:  Victor Stark, DOB 11-29-1940, MRN [id number]  PCP:  Victor Pac, MD    No chief complaint on file. bradycardia   Wt Readings from Last 3 Encounters:  01/14/16 174 lb 1.9 oz (79 kg)  10/29/14 179 lb 1.9 oz (81.2 kg)  04/20/14 170 lb (77.1 kg)       History of Present Illness: Victor Stark is a 75 y.o. male  with a history of HTN, HL, prostate CA, CKD, AAA.  Saw PCP in 10/2013 for dizziness.  Referred back for bradycardia.  BP medications were adjusted some to see if this would help.  He changed to Valsartan 160 QD, then Valsartan/HCTZ 160/12.5 mg QOD alt with Valsartan 160 QD.  BP tended to run higher.  Now, back on Valsartan/HCTZ 160/12.5 QD.  BP optimal.  No further dizziness.  He has a hx of vertigo.  He says his symptoms were similar.  He did call our office and was placed on a 24 Hr Holter.  This demonstrated NSR, PVCs, one triplet.  No significant arrhythmias.    He was also evaluated with an exercise treadmill test in late 2015. He did not achieve target heart rate. He then underwent a nuclear stress test showing no evidence of ischemia. Since that time, he has done quite well. No chest discomfort or shortness of breath. He plays golf. He does not do much more exercise than that. He does play with his grandkids.   He denies any chest discomfort, shortness of breath or orthopnea.  No swelling, lightheadedness, syncope.  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  BP at home is well controlled.  Highest is 130/70.      Past Medical History:  Diagnosis Date  . Actinic keratoses    Dr. Rozann Lesches  . Chronic kidney disease, stage III (moderate)    Dr. Welton Flakes  . Diverticulitis 2012  . Ganglion cyst of wrist    left- Dr. Daylene Katayama  . Hx of cardiovascular stress test    LexiScan Myoview (9/15):  Normal study, EF 49% (visually looks normal; quantifies mildly decreased).    . Hyperlipidemia   . Hypertension     . Melanoma ([place]) 2013   Dr. Rozann Lesches  . Prostate cancer ([place])   . Spondylosis    Dr. Brien Few    Past Surgical History:  Procedure Laterality Date  . LUMBAR DISC SURGERY    . PROSTATECTOMY    . ROTATOR CUFF REPAIR  Jan 2014  . SHOULDER SURGERY       Current Outpatient Prescriptions  Medication Sig Dispense Refill  . acetaminophen (TYLENOL) 325 MG tablet Take 650 mg by mouth as needed for pain.    Marland Kitchen amLODipine (NORVASC) 2.5 MG tablet Take 2.5 mg by mouth daily.    Marland Kitchen Bioflavonoid Products (ESTER C PO) Take 500 mg by mouth daily.    . Cholecalciferol (VITAMIN D) 2000 UNITS CAPS Take 1 capsule by mouth daily.    . Coenzyme Q10 200 MG capsule Take 200 mg by mouth daily.    . CRESTOR 20 MG tablet Take 20 mg by mouth daily.    . Multiple Vitamin (MULTIVITAMIN WITH MINERALS) TABS Take 1 tablet by mouth daily.    . Omega-3 1000 MG CAPS Take 1 g by mouth daily.    . TRAZODONE HCL PO Take 50 mg by mouth at bedtime.     . valACYclovir (VALTREX) 500 MG tablet  Take 500 mg by mouth as needed (BREAKOUTS).     . valsartan-hydrochlorothiazide (DIOVAN HCT) 160-12.5 MG per tablet 1 tab daily     No current facility-administered medications for this visit.     Allergies:   Ace inhibitors; Lipitor [atorvastatin]; Lisinopril; Lodine [etodolac]; Pravastatin; Vicodin [hydrocodone-acetaminophen]; Avodart [dutasteride]; and Amoxicillin    Social History:  The patient  reports that he quit smoking about 23 years ago. His smoking use included Cigarettes. He has a 11.25 pack-year smoking history. He has never used smokeless tobacco. He reports that he drinks alcohol. He reports that he does not use drugs.   Family History:  The patient's family history includes Asthma in his sister; Heart attack in his father; Heart attack (age of onset: 33) in his daughter.    ROS:  Please see the history of present illness.   Otherwise, review of systems are positive for knee pain.   All other systems are reviewed and  negative.    PHYSICAL EXAM: VS:  BP 119/65   Pulse (!) 44   Ht 5\' 9"  (1.753 m)   Wt 174 lb 1.9 oz (79 kg)   BMI 25.71 kg/m  , BMI Body mass index is 25.71 kg/m. GEN: Well nourished, well developed, in no acute distress  HEENT: normal  Neck: no JVD, carotid bruits, or masses Cardiac: RRR; 2/6 early systolic murmurs,no rubs, or gallops,no edema  Respiratory:  clear to auscultation bilaterally, normal work of breathing GI: soft, nontender, nondistended, + BS MS: no deformity or atrophy  Skin: warm and dry, no rash Neuro:  Strength and sensation are intact Psych: euthymic mood, full affect   EKG:   The ekg ordered today demonstrates SB, IRBBB   Recent Labs: No results found for requested labs within last 8760 hours.   Lipid Panel No results found for: CHOL, TRIG, HDL, CHOLHDL, VLDL, LDLCALC, LDLDIRECT   Other studies Reviewed: Additional studies/ records that were reviewed today with results demonstrating: 2016 Aortic u/s reviewed. 2014 echo reviewed   ASSESSMENT AND PLAN:  1. HTN : Well controlled.  COntinue current meds.  Avoid rate slowing drugs. 2. Bradycardia: No sx of bradycardia. 3. AAA: 3.2 cm in 2016.  Due for another u/s later this year.  4. Aortic sclerosis: correlates to the murmur heard on exam.    Current medicines are reviewed at length with the patient today.  The patient concerns regarding his medicines were addressed.  The following changes have been made:  No change  Labs/ tests ordered today include:  No orders of the defined types were placed in this encounter.   Recommend 150 minutes/week of aerobic exercise Low fat, low carb, high fiber diet recommended  Disposition:   FU in 1 year   Signed, Larae Grooms, MD  01/14/2016 3:39 PM    [facility] [place], [place], [place]  [address] Phone: [phone]; Fax: [phone]

## 2016-01-18 ENCOUNTER — Ambulatory Visit (HOSPITAL_COMMUNITY)
Admission: RE | Admit: 2016-01-18 | Discharge: 2016-01-18 | Disposition: A | Discharge status: Home / Self Care | Payer: Medicare HMO | Source: Ambulatory Visit | Attending: Interventional Cardiology | Admitting: Interventional Cardiology

## 2016-01-18 DIAGNOSIS — N183 Chronic kidney disease, stage 3 (moderate): Secondary | ICD-10-CM | POA: Insufficient documentation

## 2016-01-18 DIAGNOSIS — I708 Atherosclerosis of other arteries: Secondary | ICD-10-CM | POA: Insufficient documentation

## 2016-01-18 DIAGNOSIS — I700 Atherosclerosis of aorta: Secondary | ICD-10-CM | POA: Insufficient documentation

## 2016-01-18 DIAGNOSIS — I129 Hypertensive chronic kidney disease with stage 1 through stage 4 chronic kidney disease, or unspecified chronic kidney disease: Secondary | ICD-10-CM | POA: Diagnosis not present

## 2016-01-18 DIAGNOSIS — E785 Hyperlipidemia, unspecified: Secondary | ICD-10-CM | POA: Diagnosis not present

## 2016-01-18 DIAGNOSIS — I723 Aneurysm of iliac artery: Secondary | ICD-10-CM | POA: Insufficient documentation

## 2016-01-18 DIAGNOSIS — I714 Abdominal aortic aneurysm, without rupture, unspecified: Secondary | ICD-10-CM

## 2016-02-08 DIAGNOSIS — M19041 Primary osteoarthritis, right hand: Secondary | ICD-10-CM | POA: Diagnosis not present

## 2016-02-08 DIAGNOSIS — M19042 Primary osteoarthritis, left hand: Secondary | ICD-10-CM | POA: Diagnosis not present

## 2016-02-29 DIAGNOSIS — M19042 Primary osteoarthritis, left hand: Secondary | ICD-10-CM | POA: Diagnosis not present

## 2016-02-29 DIAGNOSIS — M19041 Primary osteoarthritis, right hand: Secondary | ICD-10-CM | POA: Diagnosis not present

## 2016-03-11 DIAGNOSIS — M19041 Primary osteoarthritis, right hand: Secondary | ICD-10-CM | POA: Diagnosis not present

## 2016-03-11 DIAGNOSIS — E782 Mixed hyperlipidemia: Secondary | ICD-10-CM | POA: Diagnosis not present

## 2016-03-11 DIAGNOSIS — Z0000 Encounter for general adult medical examination without abnormal findings: Secondary | ICD-10-CM | POA: Diagnosis not present

## 2016-03-11 DIAGNOSIS — R7301 Impaired fasting glucose: Secondary | ICD-10-CM | POA: Diagnosis not present

## 2016-03-11 DIAGNOSIS — R109 Unspecified abdominal pain: Secondary | ICD-10-CM | POA: Diagnosis not present

## 2016-03-11 DIAGNOSIS — L570 Actinic keratosis: Secondary | ICD-10-CM | POA: Diagnosis not present

## 2016-03-11 DIAGNOSIS — M19042 Primary osteoarthritis, left hand: Secondary | ICD-10-CM | POA: Diagnosis not present

## 2016-03-11 DIAGNOSIS — I10 Essential (primary) hypertension: Secondary | ICD-10-CM | POA: Diagnosis not present

## 2016-03-11 DIAGNOSIS — N183 Chronic kidney disease, stage 3 (moderate): Secondary | ICD-10-CM | POA: Diagnosis not present

## 2016-03-11 DIAGNOSIS — I714 Abdominal aortic aneurysm, without rupture: Secondary | ICD-10-CM | POA: Diagnosis not present

## 2016-03-23 DIAGNOSIS — R69 Illness, unspecified: Secondary | ICD-10-CM | POA: Diagnosis not present

## 2016-03-29 DIAGNOSIS — M19042 Primary osteoarthritis, left hand: Secondary | ICD-10-CM | POA: Diagnosis not present

## 2016-03-29 DIAGNOSIS — M19041 Primary osteoarthritis, right hand: Secondary | ICD-10-CM | POA: Diagnosis not present

## 2016-04-27 DIAGNOSIS — M19041 Primary osteoarthritis, right hand: Secondary | ICD-10-CM | POA: Diagnosis not present

## 2016-04-27 DIAGNOSIS — G8918 Other acute postprocedural pain: Secondary | ICD-10-CM | POA: Diagnosis not present

## 2016-04-27 DIAGNOSIS — M130 Polyarthritis, unspecified: Secondary | ICD-10-CM | POA: Diagnosis not present

## 2016-05-10 DIAGNOSIS — M19041 Primary osteoarthritis, right hand: Secondary | ICD-10-CM | POA: Diagnosis not present

## 2016-05-11 DIAGNOSIS — Z85820 Personal history of malignant melanoma of skin: Secondary | ICD-10-CM | POA: Diagnosis not present

## 2016-05-18 DIAGNOSIS — M19041 Primary osteoarthritis, right hand: Secondary | ICD-10-CM | POA: Diagnosis not present

## 2016-05-26 DIAGNOSIS — M19041 Primary osteoarthritis, right hand: Secondary | ICD-10-CM | POA: Diagnosis not present

## 2016-05-31 DIAGNOSIS — M19041 Primary osteoarthritis, right hand: Secondary | ICD-10-CM | POA: Diagnosis not present

## 2016-06-02 DIAGNOSIS — M19041 Primary osteoarthritis, right hand: Secondary | ICD-10-CM | POA: Diagnosis not present

## 2016-06-08 DIAGNOSIS — H35373 Puckering of macula, bilateral: Secondary | ICD-10-CM | POA: Diagnosis not present

## 2016-06-09 DIAGNOSIS — H35373 Puckering of macula, bilateral: Secondary | ICD-10-CM | POA: Diagnosis not present

## 2016-06-10 DIAGNOSIS — M19041 Primary osteoarthritis, right hand: Secondary | ICD-10-CM | POA: Diagnosis not present

## 2016-06-15 DIAGNOSIS — M150 Primary generalized (osteo)arthritis: Secondary | ICD-10-CM | POA: Diagnosis not present

## 2016-06-15 DIAGNOSIS — I10 Essential (primary) hypertension: Secondary | ICD-10-CM | POA: Diagnosis not present

## 2016-06-15 DIAGNOSIS — N183 Chronic kidney disease, stage 3 (moderate): Secondary | ICD-10-CM | POA: Diagnosis not present

## 2016-06-16 DIAGNOSIS — M19041 Primary osteoarthritis, right hand: Secondary | ICD-10-CM | POA: Diagnosis not present

## 2016-06-21 IMAGING — US US CAROTID DUPLEX BILAT
1 series · 13 of 24 positions shown · non-contrast
Comparison: None.

CLINICAL DATA: Hypertension and hyperlipidemia. History of
abdominal aortic aneurysm.

EXAM:
BILATERAL CAROTID DUPLEX ULTRASOUND
TECHNIQUE: Gray scale imaging, color Doppler and duplex ultrasound were
performed of bilateral carotid and vertebral arteries in the neck.

[Series 1: us carotid duplex bilat · 0.07mm/px · 13 of 57 slices shown]
[im 1/57]
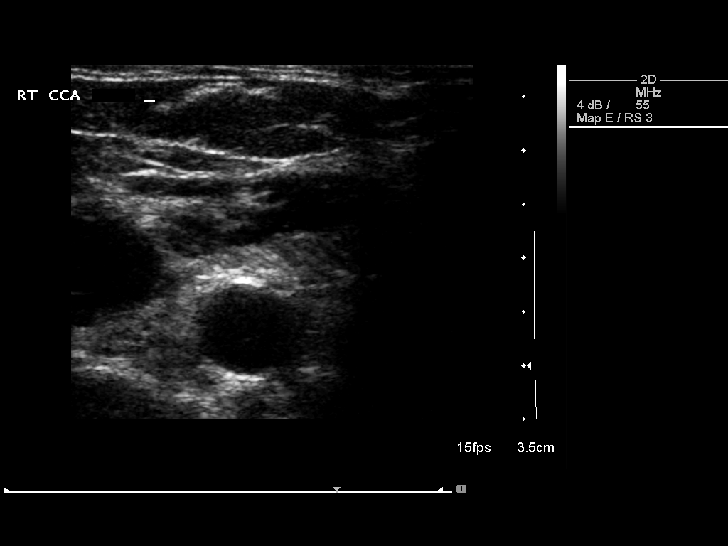
[im 5/57]
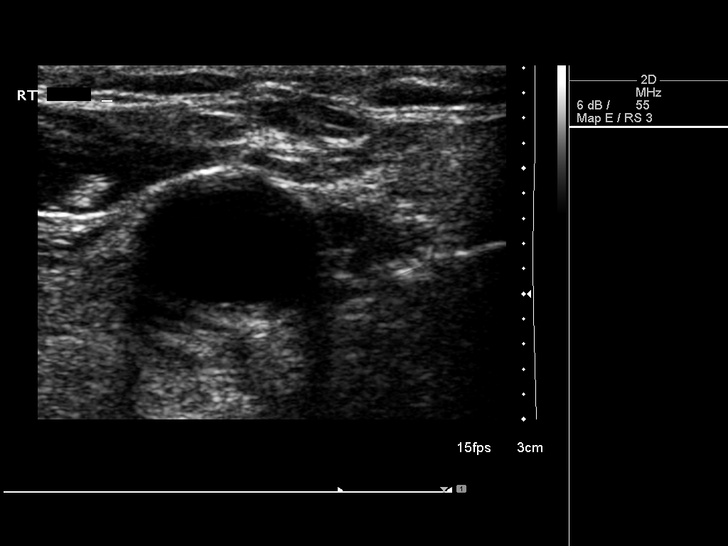
[im 10/57]
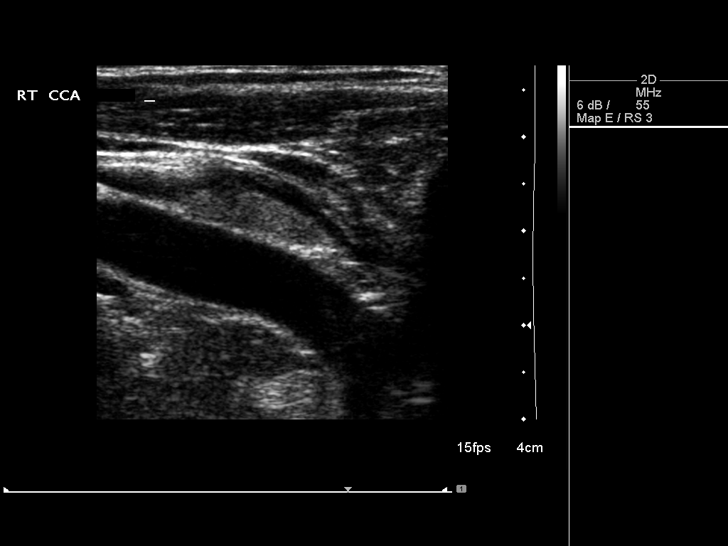
[im 15/57]
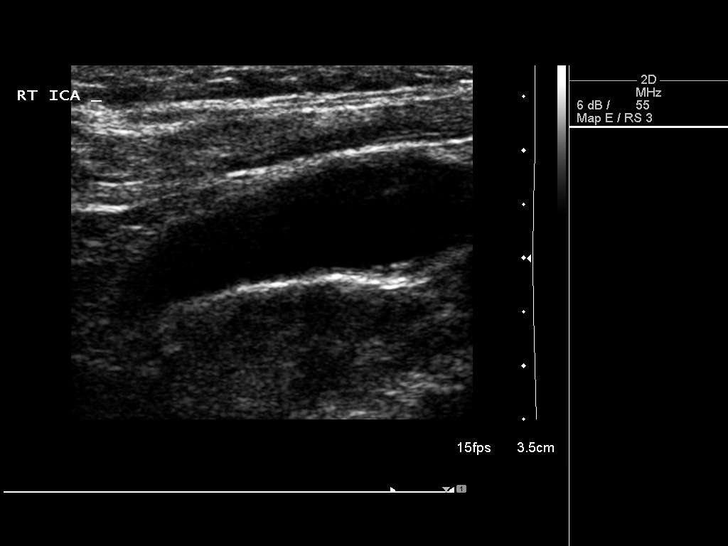
[im 20/57]
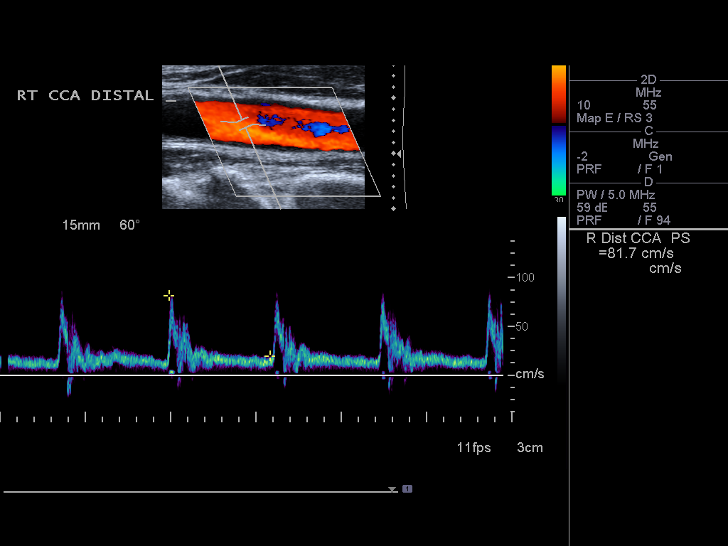
[im 25/57]
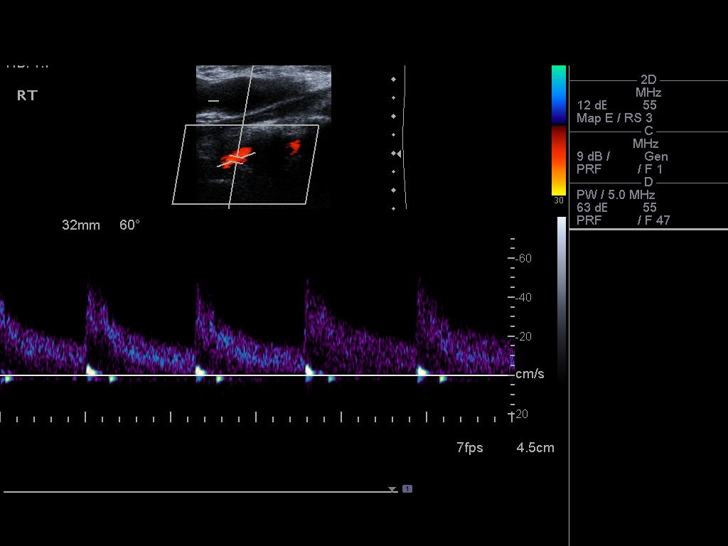
[im 30/57]
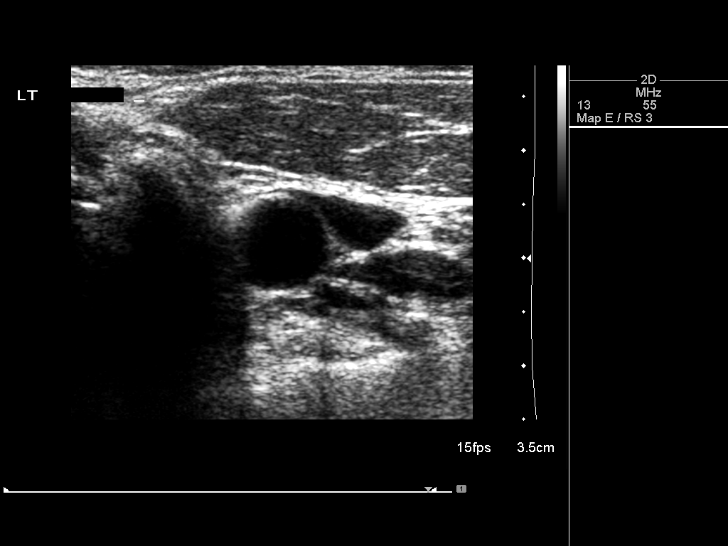
[im 32/57]
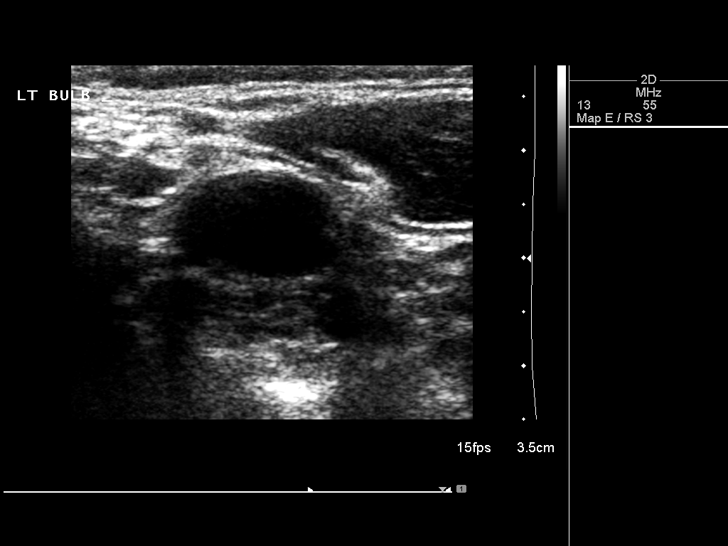
[im 37/57]
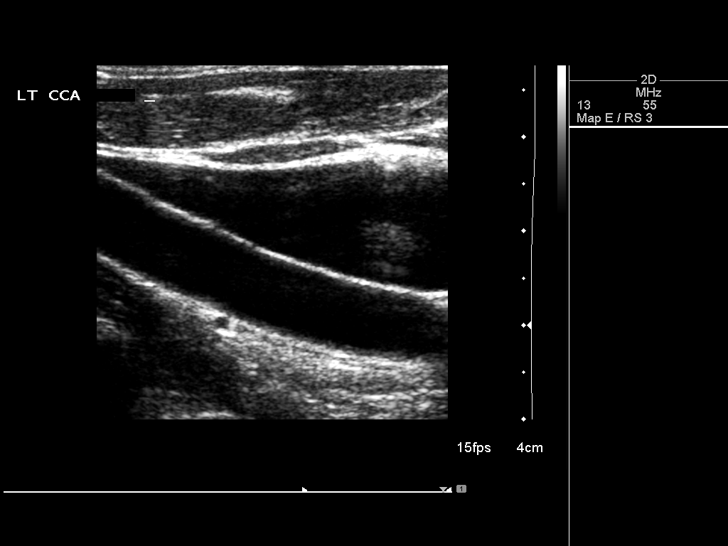
[im 42/57]
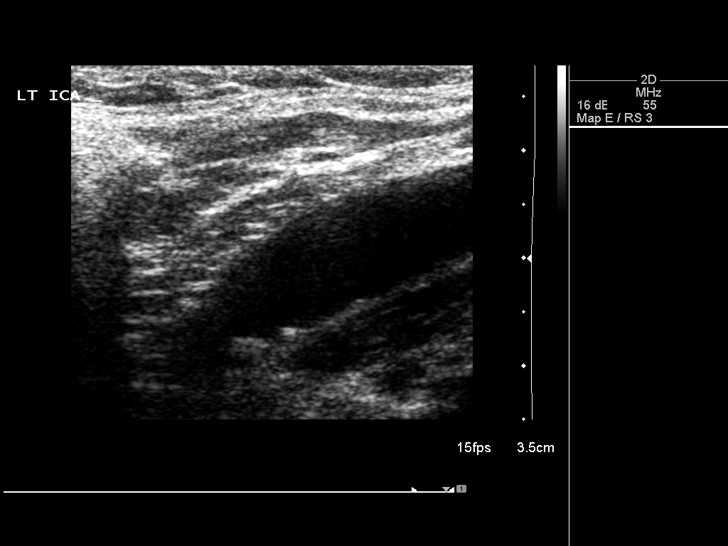
[im 47/57]
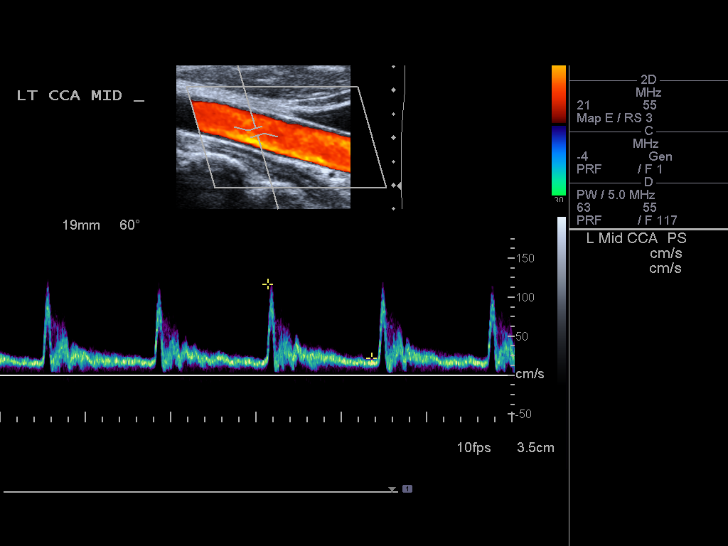
[im 52/57]
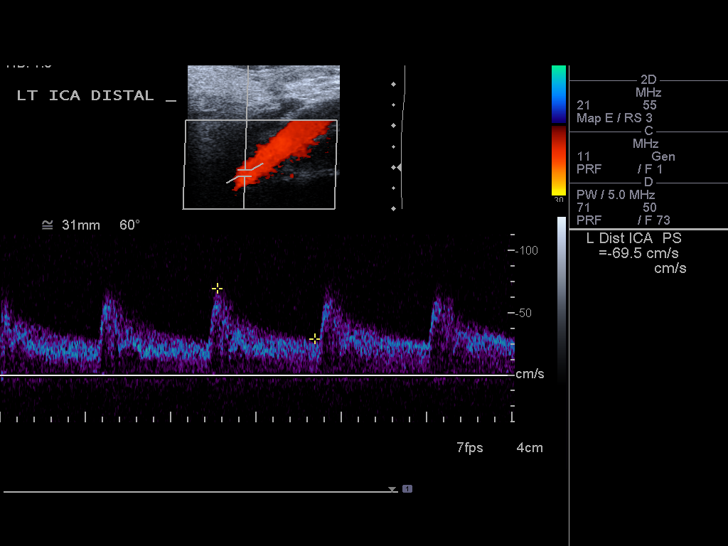
[im 57/57]
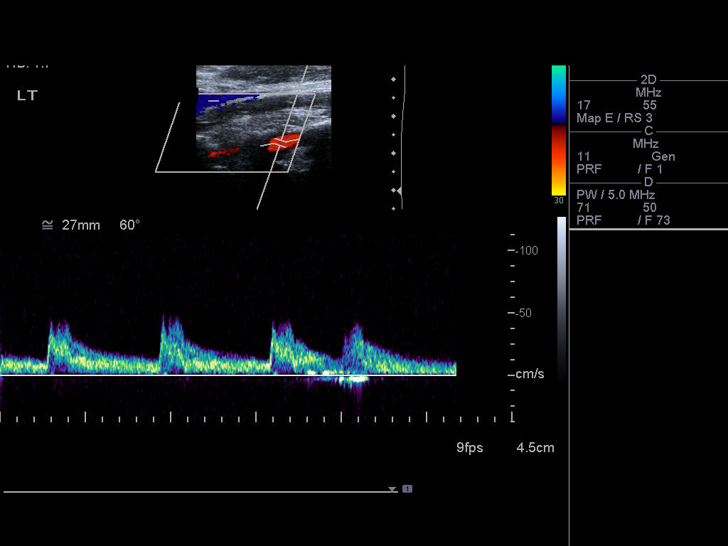

[13 of 24 positions shown; findings below may reference images not displayed]

FINDINGS: Criteria: Quantification of carotid stenosis is based on velocity
parameters that correlate the residual internal carotid diameter
with NASCET-based stenosis levels, using the diameter of the distal
internal carotid lumen as the denominator for stenosis measurement.

The following velocity measurements were obtained:

RIGHT

ICA:  69/25 cm/sec

CCA:  133/22 cm/sec

SYSTOLIC ICA/CCA RATIO:

DIASTOLIC ICA/CCA RATIO:

ECA:  66 cm/sec

LEFT

ICA:  79/19 cm/sec

CCA:  129/26 cm/sec

SYSTOLIC ICA/CCA RATIO:

DIASTOLIC ICA/CCA RATIO:

ECA:  85 cm/sec

RIGHT CAROTID ARTERY: There is a mild amount of partially calcified
plaque at the level of the carotid bulb and proximal ICA. Velocities
and waveforms are within normal limits and estimated right ICA
stenosis is less than 50%.

RIGHT VERTEBRAL ARTERY: Antegrade flow with normal waveform and
velocity.

LEFT CAROTID ARTERY: There is a mild amount of partially calcified
plaque at the level of the carotid bulb and proximal ICA. Velocities
and waveforms are normal and estimated left ICA stenosis is less
than 50%.

LEFT VERTEBRAL ARTERY: Antegrade flow with normal waveform and
velocity.
IMPRESSION: Mild amount of plaque at the level of both carotid bifurcations and
proximal internal carotid arteries. No evidence of significant
carotid stenosis with estimated bilateral ICA stenoses of less than
50%.

## 2016-06-24 DIAGNOSIS — M19041 Primary osteoarthritis, right hand: Secondary | ICD-10-CM | POA: Diagnosis not present

## 2016-06-28 DIAGNOSIS — M19041 Primary osteoarthritis, right hand: Secondary | ICD-10-CM | POA: Diagnosis not present

## 2016-07-05 DIAGNOSIS — M19042 Primary osteoarthritis, left hand: Secondary | ICD-10-CM | POA: Diagnosis not present

## 2016-08-23 DIAGNOSIS — M19041 Primary osteoarthritis, right hand: Secondary | ICD-10-CM | POA: Diagnosis not present

## 2016-08-23 DIAGNOSIS — M19042 Primary osteoarthritis, left hand: Secondary | ICD-10-CM | POA: Diagnosis not present

## 2016-08-23 DIAGNOSIS — Z4789 Encounter for other orthopedic aftercare: Secondary | ICD-10-CM | POA: Diagnosis not present

## 2016-08-26 DIAGNOSIS — N183 Chronic kidney disease, stage 3 (moderate): Secondary | ICD-10-CM | POA: Diagnosis not present

## 2016-08-26 DIAGNOSIS — I779 Disorder of arteries and arterioles, unspecified: Secondary | ICD-10-CM | POA: Diagnosis not present

## 2016-08-26 DIAGNOSIS — Z8546 Personal history of malignant neoplasm of prostate: Secondary | ICD-10-CM | POA: Diagnosis not present

## 2016-08-26 DIAGNOSIS — E782 Mixed hyperlipidemia: Secondary | ICD-10-CM | POA: Diagnosis not present

## 2016-08-26 DIAGNOSIS — R7301 Impaired fasting glucose: Secondary | ICD-10-CM | POA: Diagnosis not present

## 2016-08-26 DIAGNOSIS — I714 Abdominal aortic aneurysm, without rupture: Secondary | ICD-10-CM | POA: Diagnosis not present

## 2016-08-26 DIAGNOSIS — I10 Essential (primary) hypertension: Secondary | ICD-10-CM | POA: Diagnosis not present

## 2016-08-26 DIAGNOSIS — Z85820 Personal history of malignant melanoma of skin: Secondary | ICD-10-CM | POA: Diagnosis not present

## 2016-08-26 DIAGNOSIS — H6120 Impacted cerumen, unspecified ear: Secondary | ICD-10-CM | POA: Diagnosis not present

## 2016-08-26 DIAGNOSIS — Z0000 Encounter for general adult medical examination without abnormal findings: Secondary | ICD-10-CM | POA: Diagnosis not present

## 2016-09-27 DIAGNOSIS — N5231 Erectile dysfunction following radical prostatectomy: Secondary | ICD-10-CM | POA: Diagnosis not present

## 2016-09-27 DIAGNOSIS — C61 Malignant neoplasm of prostate: Secondary | ICD-10-CM | POA: Diagnosis not present

## 2016-11-03 DIAGNOSIS — M19041 Primary osteoarthritis, right hand: Secondary | ICD-10-CM | POA: Diagnosis not present

## 2016-11-03 DIAGNOSIS — M19042 Primary osteoarthritis, left hand: Secondary | ICD-10-CM | POA: Diagnosis not present

## 2016-11-07 DIAGNOSIS — Z08 Encounter for follow-up examination after completed treatment for malignant neoplasm: Secondary | ICD-10-CM | POA: Diagnosis not present

## 2016-11-07 DIAGNOSIS — Z85828 Personal history of other malignant neoplasm of skin: Secondary | ICD-10-CM | POA: Diagnosis not present

## 2016-11-07 DIAGNOSIS — Z1283 Encounter for screening for malignant neoplasm of skin: Secondary | ICD-10-CM | POA: Diagnosis not present

## 2016-11-15 DIAGNOSIS — M109 Gout, unspecified: Secondary | ICD-10-CM | POA: Diagnosis not present

## 2016-11-23 DIAGNOSIS — R350 Frequency of micturition: Secondary | ICD-10-CM | POA: Diagnosis not present

## 2016-12-02 DIAGNOSIS — M19042 Primary osteoarthritis, left hand: Secondary | ICD-10-CM | POA: Diagnosis not present

## 2017-01-17 ENCOUNTER — Other Ambulatory Visit: Payer: Self-pay | Admitting: Interventional Cardiology

## 2017-01-17 DIAGNOSIS — I714 Abdominal aortic aneurysm, without rupture, unspecified: Secondary | ICD-10-CM

## 2017-01-23 DIAGNOSIS — R350 Frequency of micturition: Secondary | ICD-10-CM | POA: Diagnosis not present

## 2017-01-23 DIAGNOSIS — Z0000 Encounter for general adult medical examination without abnormal findings: Secondary | ICD-10-CM | POA: Diagnosis not present

## 2017-01-23 DIAGNOSIS — I779 Disorder of arteries and arterioles, unspecified: Secondary | ICD-10-CM | POA: Diagnosis not present

## 2017-01-23 DIAGNOSIS — R7301 Impaired fasting glucose: Secondary | ICD-10-CM | POA: Diagnosis not present

## 2017-01-23 DIAGNOSIS — E782 Mixed hyperlipidemia: Secondary | ICD-10-CM | POA: Diagnosis not present

## 2017-01-23 DIAGNOSIS — Z85820 Personal history of malignant melanoma of skin: Secondary | ICD-10-CM | POA: Diagnosis not present

## 2017-01-23 DIAGNOSIS — Z8546 Personal history of malignant neoplasm of prostate: Secondary | ICD-10-CM | POA: Diagnosis not present

## 2017-01-23 DIAGNOSIS — N183 Chronic kidney disease, stage 3 (moderate): Secondary | ICD-10-CM | POA: Diagnosis not present

## 2017-01-23 DIAGNOSIS — I714 Abdominal aortic aneurysm, without rupture: Secondary | ICD-10-CM | POA: Diagnosis not present

## 2017-01-23 DIAGNOSIS — I10 Essential (primary) hypertension: Secondary | ICD-10-CM | POA: Diagnosis not present

## 2017-01-26 DIAGNOSIS — R69 Illness, unspecified: Secondary | ICD-10-CM | POA: Diagnosis not present

## 2017-01-27 ENCOUNTER — Ambulatory Visit (HOSPITAL_COMMUNITY)
Admission: RE | Admit: 2017-01-27 | Discharge: 2017-01-27 | Disposition: A | Discharge status: Home / Self Care | Payer: Medicare HMO | Source: Ambulatory Visit | Attending: Cardiovascular Disease | Admitting: Cardiovascular Disease

## 2017-01-27 DIAGNOSIS — I714 Abdominal aortic aneurysm, without rupture, unspecified: Secondary | ICD-10-CM

## 2017-01-27 DIAGNOSIS — I723 Aneurysm of iliac artery: Secondary | ICD-10-CM | POA: Diagnosis not present

## 2017-01-31 ENCOUNTER — Telehealth: Payer: Self-pay | Admitting: *Deleted

## 2017-01-31 DIAGNOSIS — I714 Abdominal aortic aneurysm, without rupture, unspecified: Secondary | ICD-10-CM

## 2017-01-31 NOTE — Telephone Encounter (Signed)
-----   Message from Jettie Booze, MD sent at 01/29/2017  4:04 PM EDT ----- Stable aortic and iliac aneurysms. Repeat study in one year.

## 2017-01-31 NOTE — Telephone Encounter (Signed)
Spoke with pt and went over results.  Pt verbalized understanding and was appreciative for call.  Order placed to have AAA completed next year.

## 2017-02-02 ENCOUNTER — Encounter: Payer: Self-pay | Admitting: Interventional Cardiology

## 2017-02-02 ENCOUNTER — Ambulatory Visit (INDEPENDENT_AMBULATORY_CARE_PROVIDER_SITE_OTHER): Payer: Medicare HMO | Admitting: Interventional Cardiology

## 2017-02-02 VITALS — BP 106/62 | HR 44 | Ht 69.0 in | Wt 162.0 lb

## 2017-02-02 DIAGNOSIS — I714 Abdominal aortic aneurysm, without rupture, unspecified: Secondary | ICD-10-CM

## 2017-02-02 DIAGNOSIS — I10 Essential (primary) hypertension: Secondary | ICD-10-CM

## 2017-02-02 DIAGNOSIS — R001 Bradycardia, unspecified: Secondary | ICD-10-CM

## 2017-02-02 NOTE — Progress Notes (Signed)
Cardiology Office Note   Date:  02/02/2017   ID:  Victor Stark, DOB 04-05-1941, MRN [id number]  PCP:  Hulan Fess, MD    No chief complaint on file.    Wt Readings from Last 3 Encounters:  02/02/17 162 lb (73.5 kg)  01/14/16 174 lb 1.9 oz (79 kg)  10/29/14 179 lb 1.9 oz (81.2 kg)       History of Present Illness: Victor Stark is a 76 y.o. male  with a history of HTN, HL, prostate CA, CKD, AAA. Saw PCP in 10/2013 for dizziness. Referred back for bradycardia. BP medications were adjusted some to see if this would help. He changed to Valsartan 160 QD, then Valsartan/HCTZ 160/12.5 mg QOD alt with Valsartan 160 QD. BP tended to run higher. Now, back on Valsartan/HCTZ 160/12.5 QD. BP optimal. No further dizziness.   He has a hx of vertigo. He says his symptoms were similar. He did call our office and was placed on a 24 Hr Holter. This demonstrated NSR, PVCs, one triplet. No significant arrhythmias.    He was also evaluated with an exercise treadmill test in late 2015. He did not achieve target heart rate. He then underwent a nuclear stress test showing no evidence of ischemia.  He continues to play golf 4-5 days/week.  He stretches.  Occasional lightheadedness if he stands quickly.   Denies : Chest pain. Dizziness. Leg edema. Nitroglycerin use. Orthopnea. Palpitations. Paroxysmal nocturnal dyspnea. Shortness of breath. Syncope.   Overall, he feels that he is doing well.  He has some stress at home.        Past Medical History:  Diagnosis Date  . Actinic keratoses    Dr. Rozann Lesches  . Chronic kidney disease, stage III (moderate)    Dr. Welton Flakes  . Diverticulitis 2012  . Ganglion cyst of wrist    left- Dr. Daylene Katayama  . Hx of cardiovascular stress test    LexiScan Myoview (9/15):  Normal study, EF 49% (visually looks normal; quantifies mildly decreased).    . Hyperlipidemia   . Hypertension   . Melanoma ([place]) 2013   Dr. Rozann Lesches  . Prostate cancer  ([place])   . Spondylosis    Dr. Brien Few    Past Surgical History:  Procedure Laterality Date  . LUMBAR DISC SURGERY    . PROSTATECTOMY    . ROTATOR CUFF REPAIR  Jan 2014  . SHOULDER SURGERY       Current Outpatient Prescriptions  Medication Sig Dispense Refill  . acetaminophen (TYLENOL) 325 MG tablet Take 650 mg by mouth as needed for pain.    Marland Kitchen amLODipine (NORVASC) 2.5 MG tablet Take 2.5 mg by mouth daily.    Marland Kitchen Bioflavonoid Products (ESTER C PO) Take 500 mg by mouth daily.    . Cholecalciferol (VITAMIN D) 2000 UNITS CAPS Take 1 capsule by mouth daily.    . Coenzyme Q10 200 MG capsule Take 200 mg by mouth daily.    Marland Kitchen losartan-hydrochlorothiazide (HYZAAR) 100-25 MG tablet Take 1 tablet by mouth daily.  2  . Multiple Vitamin (MULTIVITAMIN WITH MINERALS) TABS Take 1 tablet by mouth daily.    . Omega-3 1000 MG CAPS Take 1 g by mouth daily.    . TRAZODONE HCL PO Take 50 mg by mouth at bedtime.     . valACYclovir (VALTREX) 500 MG tablet Take 500 mg by mouth as needed (BREAKOUTS).     . valsartan-hydrochlorothiazide (DIOVAN HCT) 160-12.5 MG per tablet 1 tab daily  No current facility-administered medications for this visit.     Allergies:   Ace inhibitors; Lipitor [atorvastatin]; Lisinopril; Lodine [etodolac]; Pravastatin; Vicodin [hydrocodone-acetaminophen]; Avodart [dutasteride]; and Amoxicillin    Social History:  The patient  reports that he quit smoking about 24 years ago. His smoking use included Cigarettes. He has a 11.25 pack-year smoking history. He has never used smokeless tobacco. He reports that he drinks alcohol. He reports that he does not use drugs.   Family History:  The patient's family history includes Asthma in his sister; Heart attack in his father; Heart attack (age of onset: 26) in his daughter.    ROS:  Please see the history of present illness.   Otherwise, review of systems are positive for occasional vertigo.   All other systems are reviewed and negative.     PHYSICAL EXAM: VS:  BP 106/62   Pulse (!) 44   Ht 5\' 9"  (1.753 m)   Wt 162 lb (73.5 kg)   SpO2 97%   BMI 23.92 kg/m  , BMI Body mass index is 23.92 kg/m. GEN: Well nourished, well developed, in no acute distress  HEENT: normal  Neck: no JVD, carotid bruits, or masses Cardiac: Bradycardic; no murmurs, rubs, or gallops,no edema  Respiratory:  clear to auscultation bilaterally, normal work of breathing GI: soft, nontender, nondistended, + BS MS: no deformity or atrophy  Skin: warm and dry, no rash Neuro:  Strength and sensation are intact Psych: euthymic mood, full affect   EKG:   The ekg ordered today demonstrates sinus bradycardia, RBBB, no ST changes   Recent Labs: No results found for requested labs within last 8760 hours.   Lipid Panel No results found for: CHOL, TRIG, HDL, CHOLHDL, VLDL, LDLCALC, LDLDIRECT   Other studies Reviewed: Additional studies/ records that were reviewed today with results demonstrating: aortic u/s results as noted.  LDL 64 in 3/18.   ASSESSMENT AND PLAN:  1. HTN: Controlled even outside of MDs office. Continue current meds.  2. Bradycardia: persistent, no rate slowing meds.  No syncope.  We went over typical sx from severe bradycardia. 3. AAA: Stable aortic and iliac aneurysms noted in 8/18. 4. 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  No sx of aortic stenosis.     Current medicines are reviewed at length with the patient today.  The patient concerns regarding his medicines were addressed.  The following changes have been made:  No change  Labs/ tests ordered today include:  No orders of the defined types were placed in this encounter.   Recommend 150 minutes/week of aerobic exercise Low fat, low carb, high fiber diet recommended  Disposition:   FU in 1 year   Signed, Larae Grooms, MD  02/02/2017 4:07 PM    [facility] [address], [place], [place]  [address] Phone: [phone]; Fax: [phone]

## 2017-02-02 NOTE — Patient Instructions (Signed)

## 2017-02-09 DIAGNOSIS — M19042 Primary osteoarthritis, left hand: Secondary | ICD-10-CM | POA: Diagnosis not present

## 2017-02-09 DIAGNOSIS — M19041 Primary osteoarthritis, right hand: Secondary | ICD-10-CM | POA: Diagnosis not present

## 2017-02-21 DIAGNOSIS — R69 Illness, unspecified: Secondary | ICD-10-CM | POA: Diagnosis not present

## 2017-02-27 DIAGNOSIS — L03312 Cellulitis of back [any part except buttock]: Secondary | ICD-10-CM | POA: Diagnosis not present

## 2017-03-27 DIAGNOSIS — X32XXXD Exposure to sunlight, subsequent encounter: Secondary | ICD-10-CM | POA: Diagnosis not present

## 2017-03-27 DIAGNOSIS — Z85820 Personal history of malignant melanoma of skin: Secondary | ICD-10-CM | POA: Diagnosis not present

## 2017-03-27 DIAGNOSIS — Z1283 Encounter for screening for malignant neoplasm of skin: Secondary | ICD-10-CM | POA: Diagnosis not present

## 2017-03-27 DIAGNOSIS — L821 Other seborrheic keratosis: Secondary | ICD-10-CM | POA: Diagnosis not present

## 2017-03-27 DIAGNOSIS — L570 Actinic keratosis: Secondary | ICD-10-CM | POA: Diagnosis not present

## 2017-03-27 DIAGNOSIS — Z08 Encounter for follow-up examination after completed treatment for malignant neoplasm: Secondary | ICD-10-CM | POA: Diagnosis not present

## 2017-04-17 DIAGNOSIS — G8918 Other acute postprocedural pain: Secondary | ICD-10-CM | POA: Diagnosis not present

## 2017-04-17 DIAGNOSIS — M19042 Primary osteoarthritis, left hand: Secondary | ICD-10-CM | POA: Diagnosis not present

## 2017-05-01 DIAGNOSIS — M19042 Primary osteoarthritis, left hand: Secondary | ICD-10-CM | POA: Diagnosis not present

## 2017-05-03 DIAGNOSIS — M19042 Primary osteoarthritis, left hand: Secondary | ICD-10-CM | POA: Diagnosis not present

## 2017-05-09 DIAGNOSIS — M19042 Primary osteoarthritis, left hand: Secondary | ICD-10-CM | POA: Diagnosis not present

## 2017-05-18 DIAGNOSIS — M19042 Primary osteoarthritis, left hand: Secondary | ICD-10-CM | POA: Diagnosis not present

## 2017-05-25 DIAGNOSIS — M19042 Primary osteoarthritis, left hand: Secondary | ICD-10-CM | POA: Diagnosis not present

## 2017-06-01 DIAGNOSIS — M19042 Primary osteoarthritis, left hand: Secondary | ICD-10-CM | POA: Diagnosis not present

## 2017-06-02 DIAGNOSIS — M19042 Primary osteoarthritis, left hand: Secondary | ICD-10-CM | POA: Diagnosis not present

## 2017-06-06 DIAGNOSIS — H35373 Puckering of macula, bilateral: Secondary | ICD-10-CM | POA: Diagnosis not present

## 2017-06-07 DIAGNOSIS — M19042 Primary osteoarthritis, left hand: Secondary | ICD-10-CM | POA: Diagnosis not present

## 2017-06-15 DIAGNOSIS — N183 Chronic kidney disease, stage 3 (moderate): Secondary | ICD-10-CM | POA: Diagnosis not present

## 2017-06-15 DIAGNOSIS — R001 Bradycardia, unspecified: Secondary | ICD-10-CM | POA: Diagnosis not present

## 2017-06-15 DIAGNOSIS — M150 Primary generalized (osteo)arthritis: Secondary | ICD-10-CM | POA: Diagnosis not present

## 2017-06-15 DIAGNOSIS — I10 Essential (primary) hypertension: Secondary | ICD-10-CM | POA: Diagnosis not present

## 2017-06-22 DIAGNOSIS — M25642 Stiffness of left hand, not elsewhere classified: Secondary | ICD-10-CM | POA: Diagnosis not present

## 2017-07-03 DIAGNOSIS — M19049 Primary osteoarthritis, unspecified hand: Secondary | ICD-10-CM | POA: Diagnosis not present

## 2017-07-03 DIAGNOSIS — Z4789 Encounter for other orthopedic aftercare: Secondary | ICD-10-CM | POA: Diagnosis not present

## 2017-08-16 DIAGNOSIS — R42 Dizziness and giddiness: Secondary | ICD-10-CM | POA: Diagnosis not present

## 2017-08-22 DIAGNOSIS — R69 Illness, unspecified: Secondary | ICD-10-CM | POA: Diagnosis not present

## 2017-08-30 DIAGNOSIS — H5789 Other specified disorders of eye and adnexa: Secondary | ICD-10-CM | POA: Diagnosis not present

## 2017-08-30 DIAGNOSIS — J101 Influenza due to other identified influenza virus with other respiratory manifestations: Secondary | ICD-10-CM | POA: Diagnosis not present

## 2017-08-30 DIAGNOSIS — R05 Cough: Secondary | ICD-10-CM | POA: Diagnosis not present

## 2017-08-30 DIAGNOSIS — R0602 Shortness of breath: Secondary | ICD-10-CM | POA: Diagnosis not present

## 2017-08-30 DIAGNOSIS — R0689 Other abnormalities of breathing: Secondary | ICD-10-CM | POA: Diagnosis not present

## 2017-09-08 DIAGNOSIS — R7301 Impaired fasting glucose: Secondary | ICD-10-CM | POA: Diagnosis not present

## 2017-09-08 DIAGNOSIS — Z0000 Encounter for general adult medical examination without abnormal findings: Secondary | ICD-10-CM | POA: Diagnosis not present

## 2017-09-08 DIAGNOSIS — Z8546 Personal history of malignant neoplasm of prostate: Secondary | ICD-10-CM | POA: Diagnosis not present

## 2017-09-08 DIAGNOSIS — E782 Mixed hyperlipidemia: Secondary | ICD-10-CM | POA: Diagnosis not present

## 2017-09-08 DIAGNOSIS — I779 Disorder of arteries and arterioles, unspecified: Secondary | ICD-10-CM | POA: Diagnosis not present

## 2017-09-08 DIAGNOSIS — N183 Chronic kidney disease, stage 3 (moderate): Secondary | ICD-10-CM | POA: Diagnosis not present

## 2017-09-08 DIAGNOSIS — I10 Essential (primary) hypertension: Secondary | ICD-10-CM | POA: Diagnosis not present

## 2017-09-08 DIAGNOSIS — Z85820 Personal history of malignant melanoma of skin: Secondary | ICD-10-CM | POA: Diagnosis not present

## 2017-09-08 DIAGNOSIS — I714 Abdominal aortic aneurysm, without rupture: Secondary | ICD-10-CM | POA: Diagnosis not present

## 2017-09-11 DIAGNOSIS — Z0000 Encounter for general adult medical examination without abnormal findings: Secondary | ICD-10-CM | POA: Diagnosis not present

## 2017-09-11 DIAGNOSIS — R7301 Impaired fasting glucose: Secondary | ICD-10-CM | POA: Diagnosis not present

## 2017-09-11 DIAGNOSIS — Z85820 Personal history of malignant melanoma of skin: Secondary | ICD-10-CM | POA: Diagnosis not present

## 2017-09-11 DIAGNOSIS — I10 Essential (primary) hypertension: Secondary | ICD-10-CM | POA: Diagnosis not present

## 2017-09-11 DIAGNOSIS — Z8546 Personal history of malignant neoplasm of prostate: Secondary | ICD-10-CM | POA: Diagnosis not present

## 2017-09-11 DIAGNOSIS — E782 Mixed hyperlipidemia: Secondary | ICD-10-CM | POA: Diagnosis not present

## 2017-09-11 DIAGNOSIS — Z125 Encounter for screening for malignant neoplasm of prostate: Secondary | ICD-10-CM | POA: Diagnosis not present

## 2017-09-11 DIAGNOSIS — I714 Abdominal aortic aneurysm, without rupture: Secondary | ICD-10-CM | POA: Diagnosis not present

## 2017-09-11 DIAGNOSIS — N183 Chronic kidney disease, stage 3 (moderate): Secondary | ICD-10-CM | POA: Diagnosis not present

## 2017-09-20 DIAGNOSIS — R42 Dizziness and giddiness: Secondary | ICD-10-CM | POA: Diagnosis not present

## 2017-09-21 DIAGNOSIS — E1122 Type 2 diabetes mellitus with diabetic chronic kidney disease: Secondary | ICD-10-CM | POA: Diagnosis not present

## 2017-09-29 DIAGNOSIS — M19042 Primary osteoarthritis, left hand: Secondary | ICD-10-CM | POA: Diagnosis not present

## 2017-09-29 DIAGNOSIS — M19049 Primary osteoarthritis, unspecified hand: Secondary | ICD-10-CM | POA: Diagnosis not present

## 2017-11-01 DIAGNOSIS — X32XXXD Exposure to sunlight, subsequent encounter: Secondary | ICD-10-CM | POA: Diagnosis not present

## 2017-11-01 DIAGNOSIS — Z85820 Personal history of malignant melanoma of skin: Secondary | ICD-10-CM | POA: Diagnosis not present

## 2017-11-01 DIAGNOSIS — L570 Actinic keratosis: Secondary | ICD-10-CM | POA: Diagnosis not present

## 2017-11-01 DIAGNOSIS — Z1283 Encounter for screening for malignant neoplasm of skin: Secondary | ICD-10-CM | POA: Diagnosis not present

## 2017-11-01 DIAGNOSIS — L821 Other seborrheic keratosis: Secondary | ICD-10-CM | POA: Diagnosis not present

## 2017-11-01 DIAGNOSIS — Z08 Encounter for follow-up examination after completed treatment for malignant neoplasm: Secondary | ICD-10-CM | POA: Diagnosis not present

## 2017-12-03 DIAGNOSIS — J069 Acute upper respiratory infection, unspecified: Secondary | ICD-10-CM | POA: Diagnosis not present

## 2017-12-05 DIAGNOSIS — H35372 Puckering of macula, left eye: Secondary | ICD-10-CM | POA: Diagnosis not present

## 2017-12-20 DIAGNOSIS — J069 Acute upper respiratory infection, unspecified: Secondary | ICD-10-CM | POA: Diagnosis not present

## 2017-12-20 DIAGNOSIS — E1122 Type 2 diabetes mellitus with diabetic chronic kidney disease: Secondary | ICD-10-CM | POA: Diagnosis not present

## 2018-01-29 ENCOUNTER — Telehealth: Payer: Self-pay | Admitting: *Deleted

## 2018-01-29 ENCOUNTER — Ambulatory Visit (HOSPITAL_COMMUNITY)
Admission: RE | Admit: 2018-01-29 | Discharge: 2018-01-29 | Disposition: A | Discharge status: Home / Self Care | Payer: Medicare HMO | Source: Ambulatory Visit | Attending: Cardiology | Admitting: Cardiology

## 2018-01-29 DIAGNOSIS — I714 Abdominal aortic aneurysm, without rupture, unspecified: Secondary | ICD-10-CM

## 2018-01-29 NOTE — Telephone Encounter (Signed)
-----   Message from Jettie Booze, MD sent at 01/29/2018  3:01 PM EDT ----- Stable AAA. F/u study in 1 year.

## 2018-01-29 NOTE — Telephone Encounter (Signed)
Pt has been notified of VAS AAA results by phone with verbal understanding. Pt is agreeable to repeat AAA in 1 yr. Pt confirmed his upcoming appt on 02/13/18 with Dr. Irish Lack. Pt thanked me for the call. Order has been placed for AAA to be done in 1 yr.

## 2018-02-12 NOTE — Progress Notes (Signed)
Cardiology Office Note   Date:  02/13/2018   ID:  Victor Stark, DOB 17-Mar-1941, MRN [id number]  PCP:  Victor Fess, MD    No chief complaint on file.  HTN, bradycardia  Wt Readings from Last 3 Encounters:  02/13/18 164 lb (74.4 kg)  02/02/17 162 lb (73.5 kg)  01/14/16 174 lb 1.9 oz (79 kg)       History of Present Illness: Victor Stark is a 77 y.o. male  with a history of HTN, HL, prostate CA, CKD, AAA. Saw PCP in 10/2013 for dizziness. Referred back for bradycardia. BP medications were adjusted some to see if this would help. He changed to Valsartan 160 QD, then Valsartan/HCTZ 160/12.5 mg QOD alt with Valsartan 160 QD. BP tended to run higher. Now, back on Valsartan/HCTZ 160/12.5 QD. BP optimal. No further dizziness.   He has a hx of vertigo. He says his symptoms were similar. He did call our office and was placed on a 24 Hr Holter. This demonstrated NSR, PVCs, one triplet. No significant arrhythmias.   He was alsoevaluated with an exercise treadmill test in late 2015. He did not achieve target heart rate. He then underwent a nuclear stress test showing no evidence of ischemia.  Since the last visit, he has felt well.  He stretches frequently.  He plays golf.    Denies : Chest pain. Dizziness. Leg edema. Nitroglycerin use. Orthopnea. Palpitations. Paroxysmal nocturnal dyspnea. Shortness of breath. Syncope.   He had a nephrologist in [place].  The doctor is retiring.  He would like to follow-up with Dr. in [place] if possible.  Past Medical History:  Diagnosis Date  . AAA (abdominal aortic aneurysm) ([place]) 10/21/2013   3.1 in 1/14.   Marland Kitchen Actinic keratoses    Dr. Rozann Lesches  . Aortic sclerosis 01/14/2016  . Bradycardia 10/29/2014  . Chronic kidney disease, stage III (moderate) ([facility])    Dr. Welton Flakes  . Cough 03/22/2013   Followed in Pulmonary clinic/  Healthcare/ [facility] - try off acei 03/22/2013  > improved 04/19/2013    . Diverticulitis 2012    . Ganglion cyst of wrist    left- Dr. Daylene Katayama  . Headache(784.0) 06/06/2013  . HTN (hypertension) 04/19/2013   D/c ACEi 03/23/13 due to cough    . Hx of cardiovascular stress test    LexiScan Myoview (9/15):  Normal study, EF 49% (visually looks normal; quantifies mildly decreased).    . Hyperlipidemia   . Hypertension   . Melanoma ([place]) 2013   Dr. Rozann Lesches  . Prostate cancer ([place])   . PVC's (premature ventricular contractions) 10/21/2013   Monitor in 2014 revealed normal sinus rhythm with PVCs   . Solitary pulmonary nodule 03/23/2013   First detected 07/04/12 - See CT and f/u 09/12/12 improved    . Spondylosis    Dr. Brien Few    Past Surgical History:  Procedure Laterality Date  . LUMBAR DISC SURGERY    . PROSTATECTOMY    . ROTATOR CUFF REPAIR  Jan 2014  . SHOULDER SURGERY       Current Outpatient Medications  Medication Sig Dispense Refill  . acetaminophen (TYLENOL) 325 MG tablet Take 650 mg by mouth as needed for pain.    Marland Kitchen amLODipine (NORVASC) 2.5 MG tablet Take 2.5 mg by mouth daily.    Marland Kitchen Bioflavonoid Products (ESTER C PO) Take 500 mg by mouth daily.    . Cholecalciferol (VITAMIN D) 2000 UNITS CAPS Take 1 capsule by mouth daily.    Marland Kitchen  Coenzyme Q10 200 MG capsule Take 200 mg by mouth daily.    Marland Kitchen losartan-hydrochlorothiazide (HYZAAR) 100-25 MG tablet Take 1 tablet by mouth daily.  2  . Multiple Vitamin (MULTIVITAMIN WITH MINERALS) TABS Take 1 tablet by mouth daily.    . Omega-3 1000 MG CAPS Take 1 g by mouth daily.    . rosuvastatin (CRESTOR) 20 MG tablet Take 20 mg by mouth daily.    . TRAZODONE HCL PO Take 50 mg by mouth at bedtime.     . valACYclovir (VALTREX) 500 MG tablet Take 500 mg by mouth as needed (BREAKOUTS).      No current facility-administered medications for this visit.     Allergies:   Ace inhibitors; Atorvastatin; Etodolac; Hydrocodone-acetaminophen; Lipitor  [atorvastatin calcium]; Lisinopril; Pravastatin; Vicodin [hydrocodone-acetaminophen]; Dutasteride;  Hydrocodone; and Amoxicillin    Social History:  The patient  reports that he quit smoking about 25 years ago. His smoking use included cigarettes. He has a 11.25 pack-year smoking history. He has never used smokeless tobacco. He reports that he drinks alcohol. He reports that he does not use drugs.   Family History:  The patient's family history includes Asthma in his sister; Heart attack in his father; Heart attack (age of onset: 59) in his daughter.    ROS:  Please see the history of present illness.   Otherwise, review of systems are positive for occasional vertigo.   All other systems are reviewed and negative.    PHYSICAL EXAM: VS:  BP 132/78   Pulse (!) 49   Ht 5\' 9"  (9.470 m)   Wt 164 lb (74.4 kg)   SpO2 97%   BMI 24.22 kg/m  , BMI Body mass index is 24.22 kg/m. GEN: Well nourished, well developed, in no acute distress  HEENT: normal  Neck: no JVD, carotid bruits, or masses Cardiac: RRR; no murmurs, rubs, or gallops,no edema  Respiratory:  clear to auscultation bilaterally, normal work of breathing GI: soft, nontender, nondistended, + BS MS: no deformity or atrophy  Skin: warm and dry, no rash Neuro:  Strength and sensation are intact Psych: euthymic mood, full affect   EKG:   The ekg ordered today demonstrates sinus bradycardia, RBBB   Recent Labs: No results found for requested labs within last 8760 hours.   Lipid Panel No results found for: CHOL, TRIG, HDL, CHOLHDL, VLDL, LDLCALC, LDLDIRECT   Other studies Reviewed: Additional studies/ records that were reviewed today with results demonstrating: old ECG reviewed, HR in mid 40s.   ASSESSMENT AND PLAN:  1. HTN: well controlled at home.  The current medical regimen is effective;  continue present plan and medications.  2. Bradycardia: No syncope.  No lightheadedness. 3. AAA: 3.3 cm.  Stable in size. 4. Aortic sclerosis: Noted on 2014 echo.  5. CKD: Cr 1.6 in 2019.  Avoid nephrotoxins.  His nephrologist is  retiring.  Will refer to Dr. Posey Pronto for routine f/u going forward.  He already avoids NSAIDs.  Stay well hydrated.   Current medicines are reviewed at length with the patient today.  The patient concerns regarding his medicines were addressed.  The following changes have been made:  No change  Labs/ tests ordered today include:   Orders Placed This Encounter  Procedures  . Ambulatory referral to Nephrology  . EKG 12-Lead    Recommend 150 minutes/week of aerobic exercise Low fat, low carb, high fiber diet recommended  Disposition:   FU in 1 year   Signed, Larae Grooms, MD  02/13/2018 4:08 PM    [facility] [place], [place], [place]  [address] Phone: [phone]; Fax: [phone]

## 2018-02-13 ENCOUNTER — Ambulatory Visit: Payer: Medicare HMO | Admitting: Interventional Cardiology

## 2018-02-13 ENCOUNTER — Encounter: Payer: Self-pay | Admitting: Interventional Cardiology

## 2018-02-13 VITALS — BP 132/78 | HR 49 | Ht 69.0 in | Wt 164.0 lb

## 2018-02-13 DIAGNOSIS — N183 Chronic kidney disease, stage 3 unspecified: Secondary | ICD-10-CM

## 2018-02-13 DIAGNOSIS — I358 Other nonrheumatic aortic valve disorders: Secondary | ICD-10-CM | POA: Diagnosis not present

## 2018-02-13 DIAGNOSIS — I10 Essential (primary) hypertension: Secondary | ICD-10-CM | POA: Diagnosis not present

## 2018-02-13 DIAGNOSIS — I714 Abdominal aortic aneurysm, without rupture, unspecified: Secondary | ICD-10-CM

## 2018-02-13 DIAGNOSIS — R001 Bradycardia, unspecified: Secondary | ICD-10-CM

## 2018-02-13 NOTE — Patient Instructions (Addendum)
Medication Instructions:  Your physician recommends that you continue on your current medications as directed. Please refer to the Current Medication list given to you today.   Labwork: None ordered  Testing/Procedures: None ordered  Follow-Up: You have been referred to Dr. Elmarie Shiley, nephrologist at [facility] wants you to follow-up in: 1 year with Dr. Irish Lack. You will receive a reminder letter in the mail two months in advance. If you don't receive a letter, please call our office to schedule the follow-up appointment.   Any Other Special Instructions Will Be Listed Below (If Applicable).     If you need a refill on your cardiac medications before your next appointment, please call your pharmacy.

## 2018-02-28 DIAGNOSIS — R69 Illness, unspecified: Secondary | ICD-10-CM | POA: Diagnosis not present

## 2018-03-23 DIAGNOSIS — R69 Illness, unspecified: Secondary | ICD-10-CM | POA: Diagnosis not present

## 2018-04-19 DIAGNOSIS — N189 Chronic kidney disease, unspecified: Secondary | ICD-10-CM | POA: Diagnosis not present

## 2018-04-19 DIAGNOSIS — N183 Chronic kidney disease, stage 3 (moderate): Secondary | ICD-10-CM | POA: Diagnosis not present

## 2018-04-19 DIAGNOSIS — N2581 Secondary hyperparathyroidism of renal origin: Secondary | ICD-10-CM | POA: Diagnosis not present

## 2018-04-19 DIAGNOSIS — I129 Hypertensive chronic kidney disease with stage 1 through stage 4 chronic kidney disease, or unspecified chronic kidney disease: Secondary | ICD-10-CM | POA: Diagnosis not present

## 2018-04-19 DIAGNOSIS — D631 Anemia in chronic kidney disease: Secondary | ICD-10-CM | POA: Diagnosis not present

## 2018-04-23 ENCOUNTER — Other Ambulatory Visit: Payer: Self-pay | Admitting: Nephrology

## 2018-04-23 DIAGNOSIS — N183 Chronic kidney disease, stage 3 unspecified: Secondary | ICD-10-CM

## 2018-04-24 DIAGNOSIS — N183 Chronic kidney disease, stage 3 (moderate): Secondary | ICD-10-CM | POA: Diagnosis not present

## 2018-04-27 ENCOUNTER — Ambulatory Visit
Admission: RE | Admit: 2018-04-27 | Discharge: 2018-04-27 | Disposition: A | Discharge status: Home / Self Care | Payer: Medicare HMO | Source: Ambulatory Visit | Attending: Nephrology | Admitting: Nephrology

## 2018-04-27 DIAGNOSIS — N183 Chronic kidney disease, stage 3 unspecified: Secondary | ICD-10-CM

## 2018-05-02 DIAGNOSIS — X32XXXD Exposure to sunlight, subsequent encounter: Secondary | ICD-10-CM | POA: Diagnosis not present

## 2018-05-02 DIAGNOSIS — L821 Other seborrheic keratosis: Secondary | ICD-10-CM | POA: Diagnosis not present

## 2018-05-02 DIAGNOSIS — Z85820 Personal history of malignant melanoma of skin: Secondary | ICD-10-CM | POA: Diagnosis not present

## 2018-05-02 DIAGNOSIS — L570 Actinic keratosis: Secondary | ICD-10-CM | POA: Diagnosis not present

## 2018-05-02 DIAGNOSIS — Z08 Encounter for follow-up examination after completed treatment for malignant neoplasm: Secondary | ICD-10-CM | POA: Diagnosis not present

## 2018-05-22 DIAGNOSIS — B029 Zoster without complications: Secondary | ICD-10-CM | POA: Diagnosis not present

## 2018-06-05 DIAGNOSIS — H1013 Acute atopic conjunctivitis, bilateral: Secondary | ICD-10-CM | POA: Diagnosis not present

## 2018-07-09 DIAGNOSIS — N183 Chronic kidney disease, stage 3 (moderate): Secondary | ICD-10-CM | POA: Diagnosis not present

## 2018-07-17 DIAGNOSIS — D631 Anemia in chronic kidney disease: Secondary | ICD-10-CM | POA: Diagnosis not present

## 2018-07-17 DIAGNOSIS — N2581 Secondary hyperparathyroidism of renal origin: Secondary | ICD-10-CM | POA: Diagnosis not present

## 2018-07-17 DIAGNOSIS — I129 Hypertensive chronic kidney disease with stage 1 through stage 4 chronic kidney disease, or unspecified chronic kidney disease: Secondary | ICD-10-CM | POA: Diagnosis not present

## 2018-07-17 DIAGNOSIS — N183 Chronic kidney disease, stage 3 (moderate): Secondary | ICD-10-CM | POA: Diagnosis not present

## 2018-09-03 DIAGNOSIS — I10 Essential (primary) hypertension: Secondary | ICD-10-CM | POA: Diagnosis not present

## 2018-09-03 DIAGNOSIS — E1122 Type 2 diabetes mellitus with diabetic chronic kidney disease: Secondary | ICD-10-CM | POA: Diagnosis not present

## 2018-09-03 DIAGNOSIS — Z125 Encounter for screening for malignant neoplasm of prostate: Secondary | ICD-10-CM | POA: Diagnosis not present

## 2018-09-03 DIAGNOSIS — Z8546 Personal history of malignant neoplasm of prostate: Secondary | ICD-10-CM | POA: Diagnosis not present

## 2018-09-12 DIAGNOSIS — E782 Mixed hyperlipidemia: Secondary | ICD-10-CM | POA: Diagnosis not present

## 2018-09-12 DIAGNOSIS — M6740 Ganglion, unspecified site: Secondary | ICD-10-CM | POA: Diagnosis not present

## 2018-09-12 DIAGNOSIS — I714 Abdominal aortic aneurysm, without rupture: Secondary | ICD-10-CM | POA: Diagnosis not present

## 2018-09-12 DIAGNOSIS — I10 Essential (primary) hypertension: Secondary | ICD-10-CM | POA: Diagnosis not present

## 2018-09-12 DIAGNOSIS — Z85820 Personal history of malignant melanoma of skin: Secondary | ICD-10-CM | POA: Diagnosis not present

## 2018-09-12 DIAGNOSIS — N183 Chronic kidney disease, stage 3 (moderate): Secondary | ICD-10-CM | POA: Diagnosis not present

## 2018-09-12 DIAGNOSIS — Z0000 Encounter for general adult medical examination without abnormal findings: Secondary | ICD-10-CM | POA: Diagnosis not present

## 2018-09-12 DIAGNOSIS — Z8546 Personal history of malignant neoplasm of prostate: Secondary | ICD-10-CM | POA: Diagnosis not present

## 2018-09-12 DIAGNOSIS — I779 Disorder of arteries and arterioles, unspecified: Secondary | ICD-10-CM | POA: Diagnosis not present

## 2018-09-12 DIAGNOSIS — E1122 Type 2 diabetes mellitus with diabetic chronic kidney disease: Secondary | ICD-10-CM | POA: Diagnosis not present

## 2018-09-26 DIAGNOSIS — N183 Chronic kidney disease, stage 3 (moderate): Secondary | ICD-10-CM | POA: Diagnosis not present

## 2018-11-07 DIAGNOSIS — Z85820 Personal history of malignant melanoma of skin: Secondary | ICD-10-CM | POA: Diagnosis not present

## 2018-11-07 DIAGNOSIS — Z08 Encounter for follow-up examination after completed treatment for malignant neoplasm: Secondary | ICD-10-CM | POA: Diagnosis not present

## 2018-11-07 DIAGNOSIS — Z1283 Encounter for screening for malignant neoplasm of skin: Secondary | ICD-10-CM | POA: Diagnosis not present

## 2018-11-08 DIAGNOSIS — H5789 Other specified disorders of eye and adnexa: Secondary | ICD-10-CM | POA: Diagnosis not present

## 2018-11-28 DIAGNOSIS — M79675 Pain in left toe(s): Secondary | ICD-10-CM | POA: Diagnosis not present

## 2018-11-29 DIAGNOSIS — M79675 Pain in left toe(s): Secondary | ICD-10-CM | POA: Diagnosis not present

## 2018-12-05 DIAGNOSIS — M79675 Pain in left toe(s): Secondary | ICD-10-CM | POA: Diagnosis not present

## 2018-12-07 DIAGNOSIS — M79672 Pain in left foot: Secondary | ICD-10-CM | POA: Diagnosis not present

## 2019-01-01 DIAGNOSIS — R69 Illness, unspecified: Secondary | ICD-10-CM | POA: Diagnosis not present

## 2019-01-21 ENCOUNTER — Telehealth: Payer: Self-pay | Admitting: *Deleted

## 2019-01-21 ENCOUNTER — Telehealth: Payer: Self-pay | Admitting: Interventional Cardiology

## 2019-01-21 NOTE — Telephone Encounter (Signed)
Pt reports chest tightness that is coming and going since 7/8 pm last night.  States he woke up this morning still feeling it some.  Denies any current CP/chest tightness.  States chest tightness is a pain scale of 1 to 2.  Occurred one time a week ago, but not again until last night. Denies chest pain, radiating pain, ShOB, swelling, weight gain or dizziness/syncope. Pt is currently  Scheduled for AAA f/u testing on 8/13.  OV in October w/ Varanasi. Pt has no hx CAD. Reviewed w/ Varanasi's nurse and Richardson Dopp, PA. Pt scheduled on Wednesday to see Kathlen Mody for further evaluation of reported symptoms above. Pt advised to go to ED if chest tightness changes in nature or worsens into chest pain. Patient verbalized understanding and agreeable to plan.

## 2019-01-21 NOTE — Telephone Encounter (Signed)

## 2019-01-21 NOTE — Telephone Encounter (Signed)
  Mr Victor Stark is calling because he is feeling some chest tightness and a small amount of chest pain which started last night. He states the feeling comes and goes and happens when resting and exertion. Patient denies any other symptoms.

## 2019-01-22 NOTE — Progress Notes (Signed)
Cardiology Office Note:    Date:  01/23/2019   ID:  Victor Stark, DOB 01/28/1941, MRN [id number]  PCP:  Victor Fess, MD  Cardiologist:  Victor Grooms, MD  Electrophysiologist:  None   Referring MD: Victor Fess, MD   Chief Complaint  Patient presents with  . Chest Pain    History of Present Illness:    Victor Stark is a 78 y.o. male with:  AAA  Hypertension  Hyperlipidemia  Prostate CA  Chronic kidney disease   Victor Stark was lasts seen by Victor Stark in Aug 2019.  He called in recently with chest pain and was added on for evaluation.  He is here alone.  He had 2 episodes of chest discomfort recently.  This occurred at rest.  It was substernal.  He has not had exertional chest pain, shortness of breath.  He has not had syncope.  He has not had pleuritic chest pain.  He has not had paroxysmal nocturnal dyspnea, leg swelling.    Prior CV studies:   The following studies were reviewed today:  AAA [place] 01/29/18 Final Interpretation: Abdominal Aorta: There is evidence of abnormal dilitation of the Distal Abdominal aorta. There is evidence of abnormal dilation of the Right Common Iliac artery and Left Common Iliac artery. The largest aortic measurement is 3.3 cm. The largest aortic  diameter remains essentially unchanged compared to prior exam. Previous diameter measurement was 3.3 cm. *See table(s) above for measurements and observations. Suggest follow up study in 12 months.   Carotid US 09/01/15 IMPRESSION: Mild amount of plaque at the level of both carotid bifurcations and proximal internal carotid arteries. No evidence of significant carotid stenosis with estimated bilateral ICA stenoses of less than 50%.  Myoview 03/13/14 EF 49, no ischemia, normal study  Echo (2/14):   EF 60-65%, mild asymmetric septal hypertrophy, trace MR, aortic sclerosis  Event Monitor (08/2012):  PVCs, NSR, no significant arrhythmia  Past Medical History:  Diagnosis Date   . AAA (abdominal aortic aneurysm) (Victor Stark) 10/21/2013   3.1 in 1/14.   Victor [facility] Stark    Dr. Rozann Stark  . Aortic sclerosis 01/14/2016  . Bradycardia 10/29/2014  . Chronic kidney disease, stage III (moderate) (HCC)    Dr. Welton Stark  . Cough 03/22/2013   Followed in Pulmonary clinic/ Victor Stark - try off Stark 03/22/2013  > improved 04/19/2013    . Diverticulitis 2012  . Ganglion cyst of wrist    left- Dr. Daylene Stark  . Headache(784.0) 06/06/2013  . HTN (hypertension) 04/19/2013   D/c Stark 03/23/13 due to cough    . Hx of cardiovascular stress test    LexiScan Myoview (9/15):  Normal study, EF 49% (visually looks normal; quantifies mildly decreased).    . Hyperlipidemia   . Hypertension   . Melanoma ([place]) 2013   Dr. Rozann Stark  . Prostate cancer ([place])   . PVC's (premature ventricular contractions) 10/21/2013   Monitor in 2014 revealed normal sinus rhythm with PVCs   . Solitary pulmonary nodule 03/23/2013   First detected 07/04/12 - See CT and f/u 09/12/12 improved    . Spondylosis    Dr. Brien Stark   Surgical Hx: The patient  has a past surgical history that includes Prostatectomy; Rotator cuff repair (Jan 2014); Lumbar disc surgery; and Shoulder surgery.   Current Medications: Current Meds  Medication Sig  . acetaminophen (TYLENOL) 325 MG tablet Take 650 mg by mouth as needed for pain.  Victor Kitchen amLODipine (NORVASC) 2.5 MG tablet Take  2.5 mg by mouth daily.  Victor Kitchen Bioflavonoid Products (ESTER C PO) Take 500 mg by mouth daily.  . Cholecalciferol (VITAMIN D) 2000 UNITS CAPS Take 1 capsule by mouth daily.  . Coenzyme Q10 200 MG capsule Take 200 mg by mouth daily.  . Multiple Vitamin (MULTIVITAMIN WITH MINERALS) TABS Take 1 tablet by mouth daily.  . Omega-3 1000 MG CAPS Take 1 g by mouth daily.  . rosuvastatin (CRESTOR) 20 MG tablet Take 20 mg by mouth daily.  Victor Kitchen telmisartan-hydrochlorothiazide (MICARDIS HCT) 80-25 MG tablet Take 1 tablet by mouth daily.  . TRAZODONE HCL PO Take 50 mg by mouth at  bedtime.   . valACYclovir (VALTREX) 500 MG tablet Take 500 mg by mouth as needed (BREAKOUTS).      Allergies:   Ace inhibitors, Atorvastatin, Etodolac, Hydrocodone-acetaminophen, Lipitor  [atorvastatin calcium], Lisinopril, Pravastatin, Vicodin [hydrocodone-acetaminophen], Dutasteride, Hydrocodone, and Amoxicillin   Social History   Tobacco Use  . Smoking status: Former Smoker    Packs/day: 0.75    Years: 15.00    Pack years: 11.25    Types: Cigarettes    Quit date: 06/20/1992    Years since quitting: 26.6  . Smokeless tobacco: Never Used  Substance Use Topics  . Alcohol use: Yes    Comment: rare  . Drug use: No     Family Hx: The patient's family history includes Asthma in his sister; Heart attack in his father; Heart attack (age of onset: 66) in his daughter.  ROS:   Please see the history of present illness.    ROS All other systems reviewed and are negative.   EKGs/Labs/Other Test Reviewed:    EKG:  EKG is  ordered today.  The ekg ordered today demonstrates sinus brady, HR 41, inc RBBB, QTc 394, no changes.   Recent Labs: No results found for requested labs within last 8760 hours.   Recent Lipid Panel No results found for: CHOL, TRIG, HDL, CHOLHDL, LDLCALC, LDLDIRECT    Physical Exam:    VS:  BP 130/74   Pulse (!) 41   Ht 5\' 9"  (1.753 m)   Wt 164 lb (74.4 kg)   SpO2 97%   BMI 24.22 kg/m     Wt Readings from Last 3 Encounters:  01/23/19 164 lb (74.4 kg)  02/13/18 164 lb (74.4 kg)  02/02/17 162 lb (73.5 kg)     Physical Exam  Constitutional: He is oriented to person, place, and time. He appears well-developed and well-nourished. No distress.  HENT:  Head: Normocephalic and atraumatic.  Eyes: No scleral icterus.  Neck: No JVD present. No thyromegaly present.  Cardiovascular: Regular rhythm and normal heart sounds. Bradycardia present.  No murmur heard. Pulmonary/Chest: Effort normal and breath sounds normal. He has no rales.  Abdominal: Soft. There  is no hepatomegaly. There is abdominal tenderness in the epigastric area.  Musculoskeletal:        General: No edema.  Lymphadenopathy:    He has no cervical adenopathy.  Neurological: He is alert and oriented to person, place, and time.  Skin: Skin is warm and dry.  Psychiatric: He has a normal mood and affect.    ASSESSMENT & PLAN:    1. Other chest pain Atypical for ischemia.  He had 2 episodes several days apart.  ECG is unchanged.  He does have some epigastric tenderness, so this may all be related to GERD.  It has been 5 years since his last stress test.  I will arrange a Lexiscan Myoview.  I have also asked him to take Pepcid OTC twice daily for 2 weeks.    2. Abdominal aortic aneurysm (AAA) without rupture ([place]) FU ultrasound planned for next week.   3. Essential hypertension The patient's blood pressure is controlled on his current regimen.  Continue current therapy.    4. CKD (chronic kidney disease), stage III (HCC) Recent creatinine 1.6.   5. Bradycardia Asymptomatic.      Dispo:  Return in 9 weeks (on 03/27/2019) for Scheduled Follow Up w/ Victor Stark.   Medication Adjustments/Labs and Tests Ordered: Current medicines are reviewed at length with the patient today.  Concerns regarding medicines are outlined above.  Tests Ordered: Orders Placed This Encounter  Procedures  . Myocardial Perfusion Imaging  . EKG 12-Lead   Medication Changes: No orders of the defined types were placed in this encounter.   Signed, Richardson Dopp, PA-C  01/23/2019 12:35 PM    [facility] [place], [place], [place]  [address] Phone: [phone]; Fax: [phone]

## 2019-01-23 ENCOUNTER — Other Ambulatory Visit: Payer: Self-pay

## 2019-01-23 ENCOUNTER — Encounter: Payer: Self-pay | Admitting: Physician Assistant

## 2019-01-23 ENCOUNTER — Ambulatory Visit: Payer: Medicare HMO | Admitting: Physician Assistant

## 2019-01-23 VITALS — BP 130/74 | HR 41 | Ht 69.0 in | Wt 164.0 lb

## 2019-01-23 DIAGNOSIS — R0789 Other chest pain: Secondary | ICD-10-CM

## 2019-01-23 DIAGNOSIS — N183 Chronic kidney disease, stage 3 unspecified: Secondary | ICD-10-CM

## 2019-01-23 DIAGNOSIS — I10 Essential (primary) hypertension: Secondary | ICD-10-CM

## 2019-01-23 DIAGNOSIS — I714 Abdominal aortic aneurysm, without rupture, unspecified: Secondary | ICD-10-CM

## 2019-01-23 DIAGNOSIS — R079 Chest pain, unspecified: Secondary | ICD-10-CM

## 2019-01-23 NOTE — Patient Instructions (Signed)
Medication Instructions:  Your physician has recommended you make the following change in your medication:  1. Try Pepcid OTC twice daily X 2 weeks to see if this helps with your symptoms.    Labwork: -None  Testing/Procedures: Your physician has requested that you have a lexiscan myoview. For further information please visit [email or web address]. Please follow instruction sheet, as given.   Follow-Up: Keep follow up as planned  Any Other Special Instructions Will Be Listed Below (If Applicable). You are scheduled for a Myocardial Perfusion Imaging Study on Friday, August 14 at 8:00 am.   Please arrive 15 minutes prior to your appointment time for registration and insurance purposes.   The test will take approximately 3 to 4 hours to complete; you may bring reading material. If someone comes with you to your appointment, they will need to remain in the main lobby due to limited space in the testing area.   If you are pregnant or breastfeeding, please notify the nuclear lab prior to your appointment.   How to prepare for your Myocardial Perfusion test:   Do not eat or drink 3 hours prior to your test, except you may have water.    Do not consume products containing caffeine (regular or decaffeinated) 12 hours prior to your test (ex: coffee, chocolate, soda, tea)   Do bring a list of your current medications with you. If not listed below, you may take your medications as normal.   Bring any held medication to your appointment, as you may be required to take it once the test is complete.   Do wear comfortable clothes (no dresses or overalls) and walking shoes. Tennis shoes are preferred. No heels or open toed shoes.  Do not wear cologne, perfume, aftershave or lotions (deodorant is allowed).   If these instructions are not followed, you test will have to be rescheduled.   Please report to [address] for your test. If you have questions or concerns about  your appointment, please call the Nuclear Lab at [phone].  If you cannot keep your appointment, please provide 24 hour notification to the Nuclear lab to avoid a possible $50 charge to your account.         If you need a refill on your cardiac medications before your next appointment, please call your pharmacy.

## 2019-01-28 DIAGNOSIS — N183 Chronic kidney disease, stage 3 (moderate): Secondary | ICD-10-CM | POA: Diagnosis not present

## 2019-01-29 ENCOUNTER — Telehealth (HOSPITAL_COMMUNITY): Payer: Self-pay | Admitting: *Deleted

## 2019-01-29 NOTE — Telephone Encounter (Signed)
Patient's wife given detailed instructions per Myocardial Perfusion Study Information Sheet for the test on 02/01/19 at 8:00. Patient notified to arrive 15 minutes early and that it is imperative to arrive on time for appointment to keep from having the test rescheduled.  If you need to cancel or reschedule your appointment, please call the office within 24 hours of your appointment. . Patient verbalized understanding.Victor Stark

## 2019-01-31 ENCOUNTER — Ambulatory Visit (HOSPITAL_COMMUNITY)
Admission: RE | Admit: 2019-01-31 | Discharge: 2019-01-31 | Disposition: A | Discharge status: Home / Self Care | Payer: Medicare HMO | Source: Ambulatory Visit | Attending: Cardiovascular Disease | Admitting: Cardiovascular Disease

## 2019-01-31 ENCOUNTER — Other Ambulatory Visit: Payer: Self-pay

## 2019-01-31 ENCOUNTER — Other Ambulatory Visit (HOSPITAL_COMMUNITY): Payer: Self-pay | Admitting: Interventional Cardiology

## 2019-01-31 DIAGNOSIS — I714 Abdominal aortic aneurysm, without rupture, unspecified: Secondary | ICD-10-CM

## 2019-02-01 ENCOUNTER — Other Ambulatory Visit: Payer: Self-pay

## 2019-02-01 ENCOUNTER — Encounter: Payer: Self-pay | Admitting: Physician Assistant

## 2019-02-01 ENCOUNTER — Telehealth: Payer: Self-pay

## 2019-02-01 ENCOUNTER — Ambulatory Visit (HOSPITAL_COMMUNITY): Payer: Medicare HMO | Attending: Cardiovascular Disease

## 2019-02-01 VITALS — Ht 69.0 in | Wt 164.0 lb

## 2019-02-01 DIAGNOSIS — R110 Nausea: Secondary | ICD-10-CM | POA: Diagnosis present

## 2019-02-01 DIAGNOSIS — R079 Chest pain, unspecified: Secondary | ICD-10-CM | POA: Diagnosis not present

## 2019-02-01 DIAGNOSIS — I714 Abdominal aortic aneurysm, without rupture, unspecified: Secondary | ICD-10-CM

## 2019-02-01 LAB — MYOCARDIAL PERFUSION IMAGING
LV dias vol: 112 mL (ref 62–150)
LV sys vol: 43 mL
Peak HR: 61 {beats}/min
Rest HR: 40 {beats}/min
SDS: 1
SRS: 0
SSS: 1
TID: 0.95

## 2019-02-01 MED ORDER — TECHNETIUM TC 99M TETROFOSMIN IV KIT
10.1000 | PACK | Freq: Once | INTRAVENOUS | Status: AC | PRN
  Administered 2019-02-01: 10.1 via INTRAVENOUS
  Filled 2019-02-01: qty 11

## 2019-02-01 MED ORDER — TECHNETIUM TC 99M TETROFOSMIN IV KIT
32.6000 | PACK | Freq: Once | INTRAVENOUS | Status: AC | PRN
  Administered 2019-02-01: 32.6 via INTRAVENOUS
  Filled 2019-02-01: qty 33

## 2019-02-01 MED ORDER — AMINOPHYLLINE 25 MG/ML IV SOLN
150.0000 mg | Freq: Once | INTRAVENOUS | Status: AC
  Administered 2019-02-01: 150 mg via INTRAVENOUS

## 2019-02-01 MED ORDER — REGADENOSON 0.4 MG/5ML IV SOLN
0.4000 mg | Freq: Once | INTRAVENOUS | Status: AC
  Administered 2019-02-01: 0.4 mg via INTRAVENOUS

## 2019-02-01 NOTE — Telephone Encounter (Signed)
-----   Message from Liliane Shi, [place] sent at 02/01/2019  1:59 PM EDT ----- Please call the patient. The stress test is normal PLAN:   - Continue current medications and follow up as planned.   - Send Copy to PCP Richardson Dopp, PA-C    02/01/2019 1:58 PM

## 2019-02-01 NOTE — Progress Notes (Unsigned)
The patient has been notified of the result and verbalized understanding.  All questions (if any) were answered. Wilma Flavin, RN 02/01/2019 2:19 PM

## 2019-02-01 NOTE — Telephone Encounter (Signed)
The patient has been notified of the result and verbalized understanding.  All questions (if any) were answered. Wilma Flavin, RN 02/01/2019 2:21 PM

## 2019-02-04 DIAGNOSIS — I129 Hypertensive chronic kidney disease with stage 1 through stage 4 chronic kidney disease, or unspecified chronic kidney disease: Secondary | ICD-10-CM | POA: Diagnosis not present

## 2019-02-04 DIAGNOSIS — N183 Chronic kidney disease, stage 3 (moderate): Secondary | ICD-10-CM | POA: Diagnosis not present

## 2019-02-04 DIAGNOSIS — N2581 Secondary hyperparathyroidism of renal origin: Secondary | ICD-10-CM | POA: Diagnosis not present

## 2019-02-04 DIAGNOSIS — D631 Anemia in chronic kidney disease: Secondary | ICD-10-CM | POA: Diagnosis not present

## 2019-02-12 DIAGNOSIS — R69 Illness, unspecified: Secondary | ICD-10-CM | POA: Diagnosis not present

## 2019-03-22 DIAGNOSIS — I714 Abdominal aortic aneurysm, without rupture: Secondary | ICD-10-CM | POA: Diagnosis not present

## 2019-03-22 DIAGNOSIS — I779 Disorder of arteries and arterioles, unspecified: Secondary | ICD-10-CM | POA: Diagnosis not present

## 2019-03-22 DIAGNOSIS — I10 Essential (primary) hypertension: Secondary | ICD-10-CM | POA: Diagnosis not present

## 2019-03-22 DIAGNOSIS — E1122 Type 2 diabetes mellitus with diabetic chronic kidney disease: Secondary | ICD-10-CM | POA: Diagnosis not present

## 2019-03-22 DIAGNOSIS — Z85820 Personal history of malignant melanoma of skin: Secondary | ICD-10-CM | POA: Diagnosis not present

## 2019-03-22 DIAGNOSIS — Z8546 Personal history of malignant neoplasm of prostate: Secondary | ICD-10-CM | POA: Diagnosis not present

## 2019-03-22 DIAGNOSIS — Z0000 Encounter for general adult medical examination without abnormal findings: Secondary | ICD-10-CM | POA: Diagnosis not present

## 2019-03-22 DIAGNOSIS — E782 Mixed hyperlipidemia: Secondary | ICD-10-CM | POA: Diagnosis not present

## 2019-03-22 DIAGNOSIS — M6740 Ganglion, unspecified site: Secondary | ICD-10-CM | POA: Diagnosis not present

## 2019-03-22 DIAGNOSIS — N183 Chronic kidney disease, stage 3 unspecified: Secondary | ICD-10-CM | POA: Diagnosis not present

## 2019-03-26 NOTE — Progress Notes (Signed)
Cardiology Office Note   Date:  03/27/2019   ID:  Victor Stark, DOB 09-Dec-1940, MRN [id number]  PCP:  Hulan Fess, MD    No chief complaint on file.  HTN  Wt Readings from Last 3 Encounters:  03/27/19 166 lb 6.4 oz (75.5 kg)  02/01/19 164 lb (74.4 kg)  01/23/19 164 lb (74.4 kg)       History of Present Illness: Victor Stark is a 78 y.o. male  with a history of HTN, HL, prostate CA, CKD, AAA. Saw PCP in 10/2013 for dizziness. Referred back for bradycardia. BP medications were adjusted some to see if this would help. He changed to Valsartan 160 QD, then Valsartan/HCTZ 160/12.5 mg QOD alt with Valsartan 160 QD. BP tended to run higher. Then went to  Valsartan/HCTZ 160/12.5 QD. BP optimal. No further dizziness.   He has a hx of vertigo. He says his symptoms were similar. He did call our office and was placed on a 24 Hr Holter. This demonstrated NSR, PVCs, one triplet. No significant arrhythmias.   He was alsoevaluated with an exercise treadmill test in late 2015. He did not achieve target heart rate. He then underwent a nuclear stress test showing no evidence of ischemia.  Since the last visit, he had some chest pain and was seen by Richardson Dopp.  Stress test in 01/2019: "Normal resting and stress perfusion. No ischemia or infarction EF 62%"  Since the stress test, he has felt well.  Denies : Chest pain. Leg edema. Nitroglycerin use. Orthopnea. Palpitations. Paroxysmal nocturnal dyspnea. Shortness of breath. Syncope.   He plays golf frequently. No problems.    Not checking BP at home. Switched to telmisartan/HCTZ due to shortage.  Mild dizziness with change in positions.  Minimizing salt more strictly.      Past Medical History:  Diagnosis Date  . AAA (abdominal aortic aneurysm) ([place]) 10/21/2013   3.1 in 1/14.   Marland Kitchen Actinic keratoses    Dr. Rozann Lesches  . Aortic sclerosis 01/14/2016  . Bradycardia 10/29/2014  . Chronic kidney disease, stage III  (moderate)    Dr. Welton Flakes  . Cough 03/22/2013   Followed in Pulmonary clinic/ [facility]/ [place] - try off acei 03/22/2013  > improved 04/19/2013    . Diverticulitis 2012  . Ganglion cyst of wrist    left- Dr. Daylene Katayama  . Headache(784.0) 06/06/2013  . HTN (hypertension) 04/19/2013   D/c ACEi 03/23/13 due to cough    . Hx of cardiovascular stress test    LexiScan Myoview (9/15):  Normal study, EF 49% (visually looks normal; quantifies mildly decreased).  // Myoview 01/2019: EF 62, no ischemia or scar, low risk   . Hyperlipidemia   . Hypertension   . Melanoma ([place]) 2013   Dr. Rozann Lesches  . Prostate cancer ([place])   . PVC's (premature ventricular contractions) 10/21/2013   Monitor in 2014 revealed normal sinus rhythm with PVCs   . Solitary pulmonary nodule 03/23/2013   First detected 07/04/12 - See CT and f/u 09/12/12 improved    . Spondylosis    Dr. Brien Few    Past Surgical History:  Procedure Laterality Date  . LUMBAR DISC SURGERY    . PROSTATECTOMY    . ROTATOR CUFF REPAIR  Jan 2014  . SHOULDER SURGERY       Current Outpatient Medications  Medication Sig Dispense Refill  . acetaminophen (TYLENOL) 325 MG tablet Take 650 mg by mouth as needed for pain.    Marland Kitchen amLODipine (  NORVASC) 2.5 MG tablet Take 2.5 mg by mouth daily.    Marland Kitchen Bioflavonoid Products (ESTER C PO) Take 500 mg by mouth daily.    . Cholecalciferol (VITAMIN D) 2000 UNITS CAPS Take 1 capsule by mouth daily.    . Coenzyme Q10 200 MG capsule Take 200 mg by mouth daily.    . Multiple Vitamin (MULTIVITAMIN WITH MINERALS) TABS Take 1 tablet by mouth daily.    . Omega-3 1000 MG CAPS Take 1 g by mouth daily.    . rosuvastatin (CRESTOR) 20 MG tablet Take 20 mg by mouth daily.    Marland Kitchen telmisartan-hydrochlorothiazide (MICARDIS HCT) 80-25 MG tablet Take 1 tablet by mouth daily.    . TRAZODONE HCL PO Take 50 mg by mouth at bedtime.     . valACYclovir (VALTREX) 500 MG tablet Take 500 mg by mouth as needed (BREAKOUTS).      No current  facility-administered medications for this visit.     Allergies:   Ace inhibitors, Atorvastatin, Etodolac, Hydrocodone-acetaminophen, Lipitor  [atorvastatin calcium], Lisinopril, Pravastatin, Vicodin [hydrocodone-acetaminophen], Dutasteride, Hydrocodone, and Amoxicillin    Social History:  The patient  reports that he quit smoking about 26 years ago. His smoking use included cigarettes. He has a 11.25 pack-year smoking history. He has never used smokeless tobacco. He reports current alcohol use. He reports that he does not use drugs.   Family History:  The patient's family history includes Asthma in his sister; Heart attack in his father; Heart attack (age of onset: 106) in his daughter.    ROS:  Please see the history of present illness.   Otherwise, review of systems are positive for mild orthostatic sx.   All other systems are reviewed and negative.    PHYSICAL EXAM: VS:  BP (!) 100/50   Pulse (!) 53   Ht 5\' 9"  (1.753 m)   Wt 166 lb 6.4 oz (75.5 kg)   SpO2 95%   BMI 24.57 kg/m  , BMI Body mass index is 24.57 kg/m. GEN: Well nourished, well developed, in no acute distress  HEENT: normal  Neck: no JVD, carotid bruits, or masses Cardiac: bradycardic; no murmurs, rubs, or gallops,no edema  Respiratory:  clear to auscultation bilaterally, normal work of breathing GI: soft, nontender, nondistended, + BS MS: no deformity or atrophy  Skin: warm and dry, no rash Neuro:  Strength and sensation are intact Psych: euthymic mood, full affect   EKG:   The ekg ordered 8/5 demonstrates sinus bradycardia, no ST changes   Recent Labs: No results found for requested labs within last 8760 hours.   Lipid Panel No results found for: CHOL, TRIG, HDL, CHOLHDL, VLDL, LDLCALC, LDLDIRECT   Other studies Reviewed: Additional studies/ records that were reviewed today with results demonstrating: prior records and labs reviewed. EF 60% in 2014   ASSESSMENT AND PLAN:  1. HTN: Readings are well  controlled,. Due to dizziness, will stop amlodipine and have him check BP at home. If BP stays < 140/90, continue telmisartan/HCTZ.  2. Bradycardia: Lowest rate recently was 41.  Sinus bradycardia.  If he has lightheadedness that persists, would have to consider monitor.   HR seems to increase with activity. 3. AAA: 3.4 cm 4. Aortic sclerosis: noted on 2014 echo 5. CKD:  Cr 1.7   Current medicines are reviewed at length with the patient today.  The patient concerns regarding his medicines were addressed.  The following changes have been made:  Stop norvasc  Labs/ tests ordered today include:  No orders of the defined types were placed in this encounter.   Recommend 150 minutes/week of aerobic exercise Low fat, low carb, high fiber diet recommended  Disposition:   FU in 1 year   Signed, Larae Grooms, MD  03/27/2019 3:53 PM    [facility] [place], [place], [place]  [address] Phone: [phone]; Fax: [phone]

## 2019-03-27 ENCOUNTER — Ambulatory Visit: Payer: Medicare HMO | Admitting: Interventional Cardiology

## 2019-03-27 ENCOUNTER — Other Ambulatory Visit: Payer: Self-pay

## 2019-03-27 ENCOUNTER — Encounter: Payer: Self-pay | Admitting: Interventional Cardiology

## 2019-03-27 VITALS — BP 100/50 | HR 53 | Ht 69.0 in | Wt 166.4 lb

## 2019-03-27 DIAGNOSIS — I714 Abdominal aortic aneurysm, without rupture, unspecified: Secondary | ICD-10-CM

## 2019-03-27 DIAGNOSIS — I10 Essential (primary) hypertension: Secondary | ICD-10-CM

## 2019-03-27 DIAGNOSIS — N1832 Chronic kidney disease, stage 3b: Secondary | ICD-10-CM | POA: Diagnosis not present

## 2019-03-27 DIAGNOSIS — R001 Bradycardia, unspecified: Secondary | ICD-10-CM

## 2019-03-27 DIAGNOSIS — I358 Other nonrheumatic aortic valve disorders: Secondary | ICD-10-CM | POA: Diagnosis not present

## 2019-03-27 NOTE — Patient Instructions (Signed)
Medication Instructions:  Your physician has recommended you make the following change in your medication:   STOP: amlodipine  Your physician has requested that you regularly monitor and record your blood pressure readings at home. Please use the same machine at the same time of day to check your readings and call us in a couple of weeks to report your readings.  Lab work: None Ordered  If you have labs (blood work) drawn today and your tests are completely normal, you will receive your results only by: Marland Kitchen MyChart Message (if you have MyChart) OR . A paper copy in the mail If you have any lab test that is abnormal or we need to change your treatment, we will call you to review the results.  Testing/Procedures: None ordered  Follow-Up: At [facility], you and your health needs are our priority.  As part of our continuing mission to provide you with exceptional heart care, we have created designated Provider Care Teams.  These Care Teams include your primary Cardiologist (physician) and Advanced Practice Providers (APPs -  Physician Assistants and Nurse Practitioners) who all work together to provide you with the care you need, when you need it. . You will need a follow up appointment in 1 year.  Please call our office 2 months in advance to schedule this appointment.  You may see Casandra Doffing, MD or one of the following Advanced Practice Providers on your designated Care Team:   . Lyda Jester, PA-C . Dayna Dunn, PA-C . Ermalinda Barrios, PA-C  Any Other Special Instructions Will Be Listed Below (If Applicable).

## 2019-04-03 DIAGNOSIS — E1122 Type 2 diabetes mellitus with diabetic chronic kidney disease: Secondary | ICD-10-CM | POA: Diagnosis not present

## 2019-04-15 ENCOUNTER — Telehealth: Payer: Self-pay | Admitting: Interventional Cardiology

## 2019-04-15 NOTE — Telephone Encounter (Signed)
Follow Up:     Pt said he was supposed to give Dr Irish Lack an update on him not taking  Amlodipine. He said his blood pressure did go back up, when he stopped the AmlodipineMlodipine. It was in the range of 134 to 136.

## 2019-04-15 NOTE — Telephone Encounter (Signed)
Called and spoke to patient. He states that since stopping the amlodipine that his SBPs have been in the 130s. He states that his lightheadedness/dizziness has improved. Patient thought that he was suppose to restart amlodipine after 2 weeks. Made patient aware that he can remain off of the amlodipine since his BPs have been less than 140/90. Patient will continue to monitor BP and let us know if they become elevated.

## 2019-05-11 DIAGNOSIS — S39012A Strain of muscle, fascia and tendon of lower back, initial encounter: Secondary | ICD-10-CM | POA: Diagnosis not present

## 2019-05-31 DIAGNOSIS — G4700 Insomnia, unspecified: Secondary | ICD-10-CM | POA: Diagnosis not present

## 2019-05-31 DIAGNOSIS — Z880 Allergy status to penicillin: Secondary | ICD-10-CM | POA: Diagnosis not present

## 2019-05-31 DIAGNOSIS — Z8546 Personal history of malignant neoplasm of prostate: Secondary | ICD-10-CM | POA: Diagnosis not present

## 2019-05-31 DIAGNOSIS — Z85828 Personal history of other malignant neoplasm of skin: Secondary | ICD-10-CM | POA: Diagnosis not present

## 2019-05-31 DIAGNOSIS — N529 Male erectile dysfunction, unspecified: Secondary | ICD-10-CM | POA: Diagnosis not present

## 2019-05-31 DIAGNOSIS — Z833 Family history of diabetes mellitus: Secondary | ICD-10-CM | POA: Diagnosis not present

## 2019-05-31 DIAGNOSIS — Z8249 Family history of ischemic heart disease and other diseases of the circulatory system: Secondary | ICD-10-CM | POA: Diagnosis not present

## 2019-05-31 DIAGNOSIS — Z87891 Personal history of nicotine dependence: Secondary | ICD-10-CM | POA: Diagnosis not present

## 2019-05-31 DIAGNOSIS — E785 Hyperlipidemia, unspecified: Secondary | ICD-10-CM | POA: Diagnosis not present

## 2019-05-31 DIAGNOSIS — I10 Essential (primary) hypertension: Secondary | ICD-10-CM | POA: Diagnosis not present

## 2019-06-06 DIAGNOSIS — H10413 Chronic giant papillary conjunctivitis, bilateral: Secondary | ICD-10-CM | POA: Diagnosis not present

## 2019-06-06 DIAGNOSIS — Z961 Presence of intraocular lens: Secondary | ICD-10-CM | POA: Diagnosis not present

## 2019-06-06 DIAGNOSIS — H5203 Hypermetropia, bilateral: Secondary | ICD-10-CM | POA: Diagnosis not present

## 2019-06-18 DIAGNOSIS — E1122 Type 2 diabetes mellitus with diabetic chronic kidney disease: Secondary | ICD-10-CM | POA: Diagnosis not present

## 2019-06-18 DIAGNOSIS — Z8546 Personal history of malignant neoplasm of prostate: Secondary | ICD-10-CM | POA: Diagnosis not present

## 2019-06-18 DIAGNOSIS — M19042 Primary osteoarthritis, left hand: Secondary | ICD-10-CM | POA: Diagnosis not present

## 2019-06-18 DIAGNOSIS — C61 Malignant neoplasm of prostate: Secondary | ICD-10-CM | POA: Diagnosis not present

## 2019-06-18 DIAGNOSIS — M19041 Primary osteoarthritis, right hand: Secondary | ICD-10-CM | POA: Diagnosis not present

## 2019-06-18 DIAGNOSIS — E782 Mixed hyperlipidemia: Secondary | ICD-10-CM | POA: Diagnosis not present

## 2019-06-18 DIAGNOSIS — N183 Chronic kidney disease, stage 3 unspecified: Secondary | ICD-10-CM | POA: Diagnosis not present

## 2019-06-18 DIAGNOSIS — I10 Essential (primary) hypertension: Secondary | ICD-10-CM | POA: Diagnosis not present

## 2019-07-11 ENCOUNTER — Ambulatory Visit: Payer: Medicare HMO | Attending: Internal Medicine

## 2019-07-11 DIAGNOSIS — Z23 Encounter for immunization: Secondary | ICD-10-CM | POA: Insufficient documentation

## 2019-07-11 NOTE — Progress Notes (Signed)
   Covid-19 Vaccination Clinic  Name:  Victor Stark    MRN: [id number] DOB: 1940-11-11  07/11/2019  Victor Stark was observed post Covid-19 immunization for 15 minutes without incidence. He was provided with Vaccine Information Sheet and instruction to access the V-Safe system.   Victor Stark was instructed to call 911 with any severe reactions post vaccine: Marland Kitchen Difficulty breathing  . Swelling of your face and throat  . A fast heartbeat  . A bad rash all over your body  . Dizziness and weakness    Immunizations Administered    Name Date Dose VIS Date Route   Pfizer COVID-19 Vaccine 07/11/2019  9:00 AM 0.3 mL 05/31/2019 Intramuscular   Manufacturer: [place]   Lot: [id number]   [place]: [id number]

## 2019-07-23 DIAGNOSIS — R69 Illness, unspecified: Secondary | ICD-10-CM | POA: Diagnosis not present

## 2019-07-29 ENCOUNTER — Ambulatory Visit: Payer: Medicare HMO | Attending: Internal Medicine

## 2019-07-29 DIAGNOSIS — Z23 Encounter for immunization: Secondary | ICD-10-CM | POA: Insufficient documentation

## 2019-07-29 NOTE — Progress Notes (Signed)
   Covid-19 Vaccination Clinic  Name:  Victor Stark    MRN: [id number] DOB: 28-Mar-1941  07/29/2019  Mr. Victor Stark was observed post Covid-19 immunization for 15 minutes without incidence. He was provided with Vaccine Information Sheet and instruction to access the V-Safe system.   Mr. Victor Stark was instructed to call 911 with any severe reactions post vaccine: Marland Kitchen Difficulty breathing  . Swelling of your face and throat  . A fast heartbeat  . A bad rash all over your body  . Dizziness and weakness    Immunizations Administered    Name Date Dose VIS Date Route   Pfizer COVID-19 Vaccine 07/29/2019  8:28 AM 0.3 mL 05/31/2019 Intramuscular   Manufacturer: [place]   Lot: [id number]   [place]: [id number]

## 2019-08-23 DIAGNOSIS — Z8546 Personal history of malignant neoplasm of prostate: Secondary | ICD-10-CM | POA: Diagnosis not present

## 2019-08-23 DIAGNOSIS — M19042 Primary osteoarthritis, left hand: Secondary | ICD-10-CM | POA: Diagnosis not present

## 2019-08-23 DIAGNOSIS — C61 Malignant neoplasm of prostate: Secondary | ICD-10-CM | POA: Diagnosis not present

## 2019-08-23 DIAGNOSIS — E782 Mixed hyperlipidemia: Secondary | ICD-10-CM | POA: Diagnosis not present

## 2019-08-23 DIAGNOSIS — Z7984 Long term (current) use of oral hypoglycemic drugs: Secondary | ICD-10-CM | POA: Diagnosis not present

## 2019-08-23 DIAGNOSIS — R69 Illness, unspecified: Secondary | ICD-10-CM | POA: Diagnosis not present

## 2019-08-23 DIAGNOSIS — I10 Essential (primary) hypertension: Secondary | ICD-10-CM | POA: Diagnosis not present

## 2019-08-23 DIAGNOSIS — M19041 Primary osteoarthritis, right hand: Secondary | ICD-10-CM | POA: Diagnosis not present

## 2019-08-23 DIAGNOSIS — N183 Chronic kidney disease, stage 3 unspecified: Secondary | ICD-10-CM | POA: Diagnosis not present

## 2019-08-23 DIAGNOSIS — E1122 Type 2 diabetes mellitus with diabetic chronic kidney disease: Secondary | ICD-10-CM | POA: Diagnosis not present

## 2019-08-28 DIAGNOSIS — N189 Chronic kidney disease, unspecified: Secondary | ICD-10-CM | POA: Diagnosis not present

## 2019-08-28 DIAGNOSIS — N2581 Secondary hyperparathyroidism of renal origin: Secondary | ICD-10-CM | POA: Diagnosis not present

## 2019-08-28 DIAGNOSIS — N1832 Chronic kidney disease, stage 3b: Secondary | ICD-10-CM | POA: Diagnosis not present

## 2019-08-30 DIAGNOSIS — E1122 Type 2 diabetes mellitus with diabetic chronic kidney disease: Secondary | ICD-10-CM | POA: Diagnosis not present

## 2019-08-30 DIAGNOSIS — Z8546 Personal history of malignant neoplasm of prostate: Secondary | ICD-10-CM | POA: Diagnosis not present

## 2019-08-30 DIAGNOSIS — I10 Essential (primary) hypertension: Secondary | ICD-10-CM | POA: Diagnosis not present

## 2019-09-04 DIAGNOSIS — N1832 Chronic kidney disease, stage 3b: Secondary | ICD-10-CM | POA: Diagnosis not present

## 2019-09-04 DIAGNOSIS — I129 Hypertensive chronic kidney disease with stage 1 through stage 4 chronic kidney disease, or unspecified chronic kidney disease: Secondary | ICD-10-CM | POA: Diagnosis not present

## 2019-09-04 DIAGNOSIS — D631 Anemia in chronic kidney disease: Secondary | ICD-10-CM | POA: Diagnosis not present

## 2019-09-04 DIAGNOSIS — N2581 Secondary hyperparathyroidism of renal origin: Secondary | ICD-10-CM | POA: Diagnosis not present

## 2019-09-18 ENCOUNTER — Encounter: Payer: Self-pay | Admitting: Interventional Cardiology

## 2019-09-18 DIAGNOSIS — Z8546 Personal history of malignant neoplasm of prostate: Secondary | ICD-10-CM | POA: Diagnosis not present

## 2019-09-18 DIAGNOSIS — R001 Bradycardia, unspecified: Secondary | ICD-10-CM | POA: Diagnosis not present

## 2019-09-18 DIAGNOSIS — E1122 Type 2 diabetes mellitus with diabetic chronic kidney disease: Secondary | ICD-10-CM | POA: Diagnosis not present

## 2019-09-18 DIAGNOSIS — Z8739 Personal history of other diseases of the musculoskeletal system and connective tissue: Secondary | ICD-10-CM | POA: Diagnosis not present

## 2019-09-18 DIAGNOSIS — I10 Essential (primary) hypertension: Secondary | ICD-10-CM | POA: Diagnosis not present

## 2019-09-18 DIAGNOSIS — Z0000 Encounter for general adult medical examination without abnormal findings: Secondary | ICD-10-CM | POA: Diagnosis not present

## 2019-09-18 DIAGNOSIS — I714 Abdominal aortic aneurysm, without rupture: Secondary | ICD-10-CM | POA: Diagnosis not present

## 2019-09-18 DIAGNOSIS — E782 Mixed hyperlipidemia: Secondary | ICD-10-CM | POA: Diagnosis not present

## 2019-09-18 DIAGNOSIS — N183 Chronic kidney disease, stage 3 unspecified: Secondary | ICD-10-CM | POA: Diagnosis not present

## 2019-09-18 DIAGNOSIS — Z85820 Personal history of malignant melanoma of skin: Secondary | ICD-10-CM | POA: Diagnosis not present

## 2019-09-24 DIAGNOSIS — N1832 Chronic kidney disease, stage 3b: Secondary | ICD-10-CM | POA: Diagnosis not present

## 2019-10-02 NOTE — Progress Notes (Signed)
Larae Grooms, MD  10/03/2019 1:56 PM    [facility] [place], [place], [place]  [address] Phone: [phone]; Fax: [phone]

## 2019-10-03 ENCOUNTER — Other Ambulatory Visit: Payer: Self-pay

## 2019-10-03 ENCOUNTER — Ambulatory Visit: Payer: Medicare HMO | Admitting: Interventional Cardiology

## 2019-10-03 ENCOUNTER — Telehealth: Payer: Self-pay | Admitting: Radiology

## 2019-10-03 ENCOUNTER — Encounter: Payer: Self-pay | Admitting: Interventional Cardiology

## 2019-10-03 VITALS — BP 132/68 | HR 53 | Ht 69.0 in | Wt 166.0 lb

## 2019-10-03 DIAGNOSIS — I358 Other nonrheumatic aortic valve disorders: Secondary | ICD-10-CM | POA: Diagnosis not present

## 2019-10-03 DIAGNOSIS — I714 Abdominal aortic aneurysm, without rupture, unspecified: Secondary | ICD-10-CM

## 2019-10-03 DIAGNOSIS — N1832 Chronic kidney disease, stage 3b: Secondary | ICD-10-CM | POA: Diagnosis not present

## 2019-10-03 DIAGNOSIS — R001 Bradycardia, unspecified: Secondary | ICD-10-CM

## 2019-10-03 DIAGNOSIS — I10 Essential (primary) hypertension: Secondary | ICD-10-CM | POA: Diagnosis not present

## 2019-10-03 DIAGNOSIS — R42 Dizziness and giddiness: Secondary | ICD-10-CM | POA: Diagnosis not present

## 2019-10-03 NOTE — Telephone Encounter (Signed)
Enrolled patient for a 14 day Zio monitor to be mailed to patients home.  

## 2019-10-03 NOTE — Progress Notes (Signed)
Cardiology Office Note   Date:  10/03/2019   ID:  Victor Stark, DOB August 15, 1940, MRN [id number]  PCP:  Hulan Fess, MD    No chief complaint on file.  HTN  Wt Readings from Last 3 Encounters:  10/03/19 166 lb (75.3 kg)  03/27/19 166 lb 6.4 oz (75.5 kg)  02/01/19 164 lb (74.4 kg)       History of Present Illness: Victor Stark is a 79 y.o. male  with a history of HTN, HL, prostate CA, CKD, AAA. Saw PCP in 10/2013 for dizziness. Referred back for bradycardia. BP medications were adjusted some to see if this would help. He changed to Valsartan 160 QD, then Valsartan/HCTZ 160/12.5 mg QOD alt with Valsartan 160 QD. BP tended to run higher. Then went to  Valsartan/HCTZ 160/12.5 QD. BP optimal. No further dizziness.   He has a hx of vertigo. He says his symptoms were similar. He did call our office and was placed on a 24 Hr Holter. This demonstrated NSR, PVCs, one triplet. No significant arrhythmias.   He was alsoevaluated with an exercise treadmill test in late 2015. He did not achieve target heart rate. He then underwent a nuclear stress test showing no evidence of ischemia.  Since the last visit, he had some chest pain and was seen by Richardson Dopp.  Stress test in 01/2019: "Normal resting and stress perfusion. No ischemia or infarction EF 62%"  Switched to telmisartan/HCTZ due to shortage.  Mild dizziness with change in positions.  Minimizing salt more strictly.  He continues to have a "rush" feeling in his chest.  It is worse with a sudden change in positions.   He continues to play golf and walk the dog without problems.    Past Medical History:  Diagnosis Date  . AAA (abdominal aortic aneurysm) ([place]) 10/21/2013   3.1 in 1/14.   Marland Kitchen Actinic keratoses    Dr. Rozann Lesches  . Aortic sclerosis 01/14/2016  . Bradycardia 10/29/2014  . Chronic kidney disease, stage III (moderate)    Dr. Welton Flakes  . Cough 03/22/2013   Followed in Pulmonary clinic/ [facility]/ [facility] - try off acei 03/22/2013  > improved 04/19/2013    . Diverticulitis 2012  . Ganglion cyst of wrist    left- Dr. Daylene Katayama  . Headache(784.0) 06/06/2013  . HTN (hypertension) 04/19/2013   D/c ACEi 03/23/13 due to cough    . Hx of cardiovascular stress test    LexiScan Myoview (9/15):  Normal study, EF 49% (visually looks normal; quantifies mildly decreased).  // Myoview 01/2019: EF 62, no ischemia or scar, low risk   . Hyperlipidemia   . Hypertension   . Melanoma ([place]) 2013   Dr. Rozann Lesches  . Prostate cancer ([place])   . PVC's (premature ventricular contractions) 10/21/2013   Monitor in 2014 revealed normal sinus rhythm with PVCs   . Solitary pulmonary nodule 03/23/2013   First detected 07/04/12 - See CT and f/u 09/12/12 improved    . Spondylosis    Dr. Brien Few    Past Surgical History:  Procedure Laterality Date  . LUMBAR DISC SURGERY    . PROSTATECTOMY    . ROTATOR CUFF REPAIR  Jan 2014  . SHOULDER SURGERY       Current Outpatient Medications  Medication Sig Dispense Refill  . acetaminophen (TYLENOL) 325 MG tablet Take 650 mg by mouth as needed for pain.    Marland Kitchen amLODipine (NORVASC) 2.5 MG tablet Take 2.5 mg by mouth daily.    Marland Kitchen  Bioflavonoid Products (ESTER C PO) Take 500 mg by mouth daily.    . Cholecalciferol (VITAMIN D) 2000 UNITS CAPS Take 1 capsule by mouth daily.    . Coenzyme Q10 200 MG capsule Take 200 mg by mouth daily.    . Multiple Vitamin (MULTIVITAMIN WITH MINERALS) TABS Take 1 tablet by mouth daily.    . Omega-3 1000 MG CAPS Take 1 g by mouth daily.    . rosuvastatin (CRESTOR) 20 MG tablet Take 20 mg by mouth daily.    Marland Kitchen telmisartan-hydrochlorothiazide (MICARDIS HCT) 80-25 MG tablet Take 1 tablet by mouth daily.    . TRAZODONE HCL PO Take 50 mg by mouth at bedtime.     . valACYclovir (VALTREX) 500 MG tablet Take 500 mg by mouth as needed (BREAKOUTS).      No current facility-administered medications for this visit.    Allergies:   Ace inhibitors,  Atorvastatin, Etodolac, Hydrocodone-acetaminophen, Lipitor  [atorvastatin calcium], Lisinopril, Pravastatin, Vicodin [hydrocodone-acetaminophen], Dutasteride, Hydrocodone, and Amoxicillin    Social History:  The patient  reports that he quit smoking about 27 years ago. His smoking use included cigarettes. He has a 11.25 pack-year smoking history. He has never used smokeless tobacco. He reports current alcohol use. He reports that he does not use drugs.   Family History:  The patient's family history includes Asthma in his sister; Heart attack in his father; Heart attack (age of onset: 62) in his daughter.    ROS:  Please see the history of present illness.   Otherwise, review of systems are positive for lightheadedness.   All other systems are reviewed and negative.    PHYSICAL EXAM: VS:  BP 132/68   Pulse (!) 53   Ht 5\' 9"  (1.753 m)   Wt 166 lb (75.3 kg)   SpO2 96%   BMI 24.51 kg/m  , BMI Body mass index is 24.51 kg/m. GEN: Well nourished, well developed, in no acute distress  HEENT: normal  Neck: no JVD, carotid bruits, or masses Cardiac: RRR; no murmurs, rubs, or gallops,no edema  Respiratory:  clear to auscultation bilaterally, normal work of breathing GI: soft, nontender, nondistended, + BS MS: no deformity or atrophy  Skin: warm and dry, no rash Neuro:  Strength and sensation are intact Psych: euthymic mood, full affect   EKG:   The ekg ordered in 01/2019 demonstrates sinus bradycardia, right bundle branch block   Recent Labs: No results found for requested labs within last 8760 hours.   Lipid Panel No results found for: CHOL, TRIG, HDL, CHOLHDL, VLDL, LDLCALC, LDLDIRECT   Other studies Reviewed: Additional studies/ records that were reviewed today with results demonstrating: PMD labs reviewed, Cr 1.84.   ASSESSMENT AND PLAN:  1. HTN: The current medical regimen is effective;  continue present plan and medications. 2. Dizziness: Plan for 14 day Zio patch.    3. Bradycardia: COuld be cause of his lightheadedness.  Stay hydrated. Watch for presyncope or syncope.  Given his resting bradycardia and right bundle branch block noted on prior ECG, his symptoms of lightheadedness may be related to bradycardia.  Avoid rate slowing drugs. 4. AAA: 3.4 cm 5. CKD: Creatinine 1.8 in March 2021 6. Aortic sclerosis noted in 2014.   Current medicines are reviewed at length with the patient today.  The patient concerns regarding his medicines were addressed.  The following changes have been made:  No change  Labs/ tests ordered today include:  No orders of the defined types were placed in this  encounter.   Recommend 150 minutes/week of aerobic exercise Low fat, low carb, high fiber diet recommended  Disposition:   FU in 1 year   Signed, Larae Grooms, MD  10/03/2019 1:56 PM    [facility] [address], [place], [place]  [address] Phone: [phone]; Fax: [phone]

## 2019-10-03 NOTE — Patient Instructions (Signed)
Medication Instructions:  Your physician recommends that you continue on your current medications as directed. Please refer to the Current Medication list given to you today.  *If you need a refill on your cardiac medications before your next appointment, please call your pharmacy*   Lab Work: None ordered  If you have labs (blood work) drawn today and your tests are completely normal, you will receive your results only by: Marland Kitchen MyChart Message (if you have MyChart) OR . A paper copy in the mail If you have any lab test that is abnormal or we need to change your treatment, we will call you to review the results.   Testing/Procedures: Your physician has recommended that you wear a 14 day monitor. These monitors are medical devices that record the heart's electrical activity. Doctors most often use these monitors to diagnose arrhythmias. Arrhythmias are problems with the speed or rhythm of the heartbeat. The monitor is a small, portable device. You can wear one while you do your normal daily activities. This is usually used to diagnose what is causing palpitations/syncope (passing out).   Follow-Up: Follow up with Dr. Irish Lack on 11/11/19 at 1:40 PM   Other Instructions ZIO XT- Long Term Monitor Instructions   Your physician has requested you wear your ZIO patch monitor 14 days.   This is a single patch monitor.  [employer] supplies one patch monitor per enrollment.  Additional stickers are not available.   Please do not apply patch if you will be having a Nuclear Stress Test, Echocardiogram, Cardiac CT, MRI, or Chest Xray during the time frame you would be wearing the monitor. The patch cannot be worn during these tests.  You cannot remove and re-apply the ZIO XT patch monitor.   Your ZIO patch monitor will be sent USPS Priority mail from [facility] directly to your home address. The monitor may also be mailed to a PO BOX if home delivery is not available.   It may take 3-5 days to  receive your monitor after you have been enrolled.   Once you have received you monitor, please review enclosed instructions.  Your monitor has already been registered assigning a specific monitor serial # to you.   Applying the monitor   Shave hair from upper left chest.   Hold abrader disc by orange tab.  Rub abrader in 40 strokes over left upper chest as indicated in your monitor instructions.   Clean area with 4 enclosed alcohol pads .  Use all pads to assure are is cleaned thoroughly.  Let dry.   Apply patch as indicated in monitor instructions.  Patch will be place under collarbone on left side of chest with arrow pointing upward.   Rub patch adhesive wings for 2 minutes.Remove white label marked "1".  Remove white label marked "2".  Rub patch adhesive wings for 2 additional minutes.   While looking in a mirror, press and release button in center of patch.  A small green light will flash 3-4 times .  This will be your only indicator the monitor has been turned on.     Do not shower for the first 24 hours.  You may shower after the first 24 hours.   Press button if you feel a symptom. You will hear a small click.  Record Date, Time and Symptom in the Patient Log Book.   When you are ready to remove patch, follow instructions on last 2 pages of Patient Log Book.  Stick patch monitor onto last  page of Patient Log Book.   Place Patient Log Book in [place] box.  Use locking tab on box and tape box closed securely.  The [employer] has [employer] on it.  Please place in mailbox as soon as possible.  Your physician should have your test results approximately 7 days after the monitor has been mailed back to [facility].   Call [facility] [facility] at [phone] if you have questions regarding your ZIO XT patch monitor.  Call them immediately if you see an orange light blinking on your monitor.   If your monitor falls off in less than 4 days contact our Monitor  department at [phone].  If your monitor becomes loose or falls off after 4 days call [employer] at [phone] for suggestions on securing your monitor.

## 2019-10-07 ENCOUNTER — Other Ambulatory Visit (INDEPENDENT_AMBULATORY_CARE_PROVIDER_SITE_OTHER): Payer: Medicare HMO

## 2019-10-07 DIAGNOSIS — R42 Dizziness and giddiness: Secondary | ICD-10-CM | POA: Diagnosis not present

## 2019-10-07 DIAGNOSIS — R001 Bradycardia, unspecified: Secondary | ICD-10-CM | POA: Diagnosis not present

## 2019-10-28 DIAGNOSIS — N1832 Chronic kidney disease, stage 3b: Secondary | ICD-10-CM | POA: Diagnosis not present

## 2019-11-04 DIAGNOSIS — R42 Dizziness and giddiness: Secondary | ICD-10-CM | POA: Diagnosis not present

## 2019-11-04 DIAGNOSIS — R001 Bradycardia, unspecified: Secondary | ICD-10-CM | POA: Diagnosis not present

## 2019-11-05 ENCOUNTER — Telehealth: Payer: Self-pay | Admitting: Interventional Cardiology

## 2019-11-05 DIAGNOSIS — R001 Bradycardia, unspecified: Secondary | ICD-10-CM

## 2019-11-05 DIAGNOSIS — R42 Dizziness and giddiness: Secondary | ICD-10-CM

## 2019-11-05 NOTE — Telephone Encounter (Signed)
Patient is not on any BB and denies syncope. Reviewed monitor faxed by [employer] with DOD, Dr. Radford Pax. Per Dr. 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 She asks that I touch base with Dr. Irish Lack to see what his thoughts were. Spoke with Dr. Irish Lack who states that since the patient has not had any syncope he does not need to go to the ER. Patient needs referral to EP to discuss PPM. Scheduled the patient with Oda Kilts, [place] on 5/21 at 9:15 AM. Jonni Sanger agrees to see the patient since Dr. [name] will be in the office. Instructed the patient to take it easy and report to the ER if he has any syncopal episodes. He verbalized understanding and thanked me for the call.

## 2019-11-05 NOTE — Telephone Encounter (Signed)
Spoke with Raquel Sarna from [employer] who was calling to report that the patient wore a ZIO 10/07/19-10/20/19. She is calling to report abnormal findings of symptomatic bradycardia with a rate of 38 bpm for 30 sec and 4 runs of SVT. She states that the report has been uploaded online. Will have our monitor technicians download report for MD to review.

## 2019-11-05 NOTE — Telephone Encounter (Signed)
Monitor has been imported and is ready for the doctor to read

## 2019-11-05 NOTE — Telephone Encounter (Signed)
Raquel Sarna from [employer] calling for abnormal cardiac results

## 2019-11-06 DIAGNOSIS — E1122 Type 2 diabetes mellitus with diabetic chronic kidney disease: Secondary | ICD-10-CM | POA: Diagnosis not present

## 2019-11-06 DIAGNOSIS — C61 Malignant neoplasm of prostate: Secondary | ICD-10-CM | POA: Diagnosis not present

## 2019-11-06 DIAGNOSIS — N183 Chronic kidney disease, stage 3 unspecified: Secondary | ICD-10-CM | POA: Diagnosis not present

## 2019-11-06 DIAGNOSIS — E782 Mixed hyperlipidemia: Secondary | ICD-10-CM | POA: Diagnosis not present

## 2019-11-06 DIAGNOSIS — I129 Hypertensive chronic kidney disease with stage 1 through stage 4 chronic kidney disease, or unspecified chronic kidney disease: Secondary | ICD-10-CM | POA: Diagnosis not present

## 2019-11-06 DIAGNOSIS — D631 Anemia in chronic kidney disease: Secondary | ICD-10-CM | POA: Diagnosis not present

## 2019-11-06 DIAGNOSIS — Z8546 Personal history of malignant neoplasm of prostate: Secondary | ICD-10-CM | POA: Diagnosis not present

## 2019-11-06 DIAGNOSIS — M19041 Primary osteoarthritis, right hand: Secondary | ICD-10-CM | POA: Diagnosis not present

## 2019-11-06 DIAGNOSIS — N2581 Secondary hyperparathyroidism of renal origin: Secondary | ICD-10-CM | POA: Diagnosis not present

## 2019-11-06 DIAGNOSIS — I10 Essential (primary) hypertension: Secondary | ICD-10-CM | POA: Diagnosis not present

## 2019-11-06 DIAGNOSIS — N1832 Chronic kidney disease, stage 3b: Secondary | ICD-10-CM | POA: Diagnosis not present

## 2019-11-06 DIAGNOSIS — M19042 Primary osteoarthritis, left hand: Secondary | ICD-10-CM | POA: Diagnosis not present

## 2019-11-06 NOTE — Telephone Encounter (Signed)
Follow Up  Patient has more questions about monitor results and states that he needs more instructions on what he needs to do. Please give patient a call back to discuss.

## 2019-11-06 NOTE — Telephone Encounter (Signed)
Spoke with the patient who was confused about his upcoming appointment with [place] on 5/21. I informed the patient that this was an in office appointment and he was not having a procedure done on Friday. He verbalized understanding.

## 2019-11-07 NOTE — Progress Notes (Signed)
PCP:  Victor Fess, MD Primary Cardiologist: Larae Grooms, MD Electrophysiologist: [name] is a 79 y.o. male seen today with Dr. [name] for acute visit due to symptomatic bradycardia.  Since wearing monitor, patient has felt about the same. He feels lightheaded this am consistent with his previous symptoms, HR 40 on arrival by EKG. He has had no syncope. He is not on any AV nodal blocking agents. He is on low dose amlodipine which could theoretically have an effect on his HR. He has palpitations at times and was shown to have PVCs on his monitor. He denies chest pain, dyspnea, PND, orthopnea, nausea, vomiting, syncope, edema, weight gain, or early satiety.  14 day Zio 4/19 - 10/20/19 showed symptoms of lightheadedness associated with HRs in upper 30/low 40s. Short episodes of narrow complex tachycardia, suspected to be AT.   Myoview 01/2019  EF 62% Normal/low risk study  Past Medical History:  Diagnosis Date  . AAA (abdominal aortic aneurysm) ([place]) 10/21/2013   3.1 in 1/14.   Marland Kitchen Actinic keratoses    Dr. Rozann Lesches  . Aortic sclerosis 01/14/2016  . Bradycardia 10/29/2014  . Chronic kidney disease, stage III (moderate)    Dr. Welton Flakes  . Cough 03/22/2013   Followed in Pulmonary clinic/ [facility]/ [facility] - try off acei 03/22/2013  > improved 04/19/2013    . Diverticulitis 2012  . Ganglion cyst of wrist    left- Dr. Daylene Katayama  . Headache(784.0) 06/06/2013  . HTN (hypertension) 04/19/2013   D/c ACEi 03/23/13 due to cough    . Hx of cardiovascular stress test    LexiScan Myoview (9/15):  Normal study, EF 49% (visually looks normal; quantifies mildly decreased).  // Myoview 01/2019: EF 62, no ischemia or scar, low risk   . Hyperlipidemia   . Hypertension   . Melanoma ([place]) 2013   Dr. Rozann Lesches  . Prostate cancer ([place])   . PVC's (premature ventricular contractions) 10/21/2013   Monitor in 2014 revealed normal sinus rhythm with PVCs   . Solitary pulmonary nodule 03/23/2013   First detected 07/04/12 - See CT and f/u 09/12/12 improved    . Spondylosis    Dr. Brien Few   Past Surgical History:  Procedure Laterality Date  . LUMBAR DISC SURGERY    . PROSTATECTOMY    . ROTATOR CUFF REPAIR  Jan 2014  . SHOULDER SURGERY      Current Outpatient Medications  Medication Sig Dispense Refill  . acetaminophen (TYLENOL) 325 MG tablet Take 650 mg by mouth as needed for pain.    Marland Kitchen amLODipine (NORVASC) 2.5 MG tablet Take 2.5 mg by mouth daily.    Marland Kitchen Bioflavonoid Products (ESTER C PO) Take 500 mg by mouth daily.    . Cholecalciferol (VITAMIN D) 2000 UNITS CAPS Take 1 capsule by mouth daily.    . Coenzyme Q10 200 MG capsule Take 200 mg by mouth daily.    . Multiple Vitamin (MULTIVITAMIN WITH MINERALS) TABS Take 1 tablet by mouth daily.    . Omega-3 1000 MG CAPS Take 1 g by mouth daily.    . rosuvastatin (CRESTOR) 20 MG tablet Take 20 mg by mouth daily.    Marland Kitchen telmisartan-hydrochlorothiazide (MICARDIS HCT) 80-25 MG tablet Take 1 tablet by mouth daily.    . TRAZODONE HCL PO Take 50 mg by mouth at bedtime.     . valACYclovir (VALTREX) 500 MG tablet Take 500 mg by mouth as needed (BREAKOUTS).      No current facility-administered medications  for this visit.    Allergies  Allergen Reactions  . Ace Inhibitors Other (See Comments)    _0  Dry cough Dry cough Dry cough   . Atorvastatin Other (See Comments)    _1   . Etodolac Hives and Swelling  . Hydrocodone-Acetaminophen Swelling and Hives  . Lipitor  [Atorvastatin Calcium] Other (See Comments)  . Lisinopril Other (See Comments)    Dry cough Dry cough  . Pravastatin Other (See Comments)    _2   . Vicodin [Hydrocodone-Acetaminophen] Hives and Swelling  . Dutasteride Other (See Comments)    Lack of therapeutic effect Lack of therapeutic effect Lack of therapeutic effect Lack of therapeutic  effect Lack of therapeutic effect  . Hydrocodone   . Amoxicillin Rash    Social History   Socioeconomic History  . Marital status: Married    Spouse name: Victor Stark  . Number of children: 2  . Years of education: college  . Highest education level: Not on file  Occupational History  . Occupation: Barrister's clerk: RETIRED  Tobacco Use  . Smoking status: Former Smoker    Packs/day: 0.75    Years: 15.00    Pack years: 11.25    Types: Cigarettes    Quit date: 06/20/1992    Years since quitting: 27.4  . Smokeless tobacco: Never Used  Substance and Sexual Activity  . Alcohol use: Yes    Comment: rare  . Drug use: No  . Sexual activity: Not on file  Other Topics Concern  . Not on file  Social History Narrative   Patient is married([name])   Patient has two children.   Patient is retired from [employer].   Patient drinks two servings of coffee and tea daily.   Social Determinants of Health   Financial Resource Strain:   . Difficulty of Paying Living Expenses:   Food Insecurity:   . Worried About Charity fundraiser in the Last Year:   . Arboriculturist in the Last Year:   Transportation Needs:   . Film/video editor (Medical):   Marland Kitchen Lack of Transportation (Non-Medical):   Physical Activity:   . Days of Exercise per Week:   . Minutes of Exercise per Session:   Stress:   . Feeling of Stress :   Social Connections:   . Frequency of Communication with Friends and Family:   . Frequency of Social Gatherings with Friends and Family:   . Attends Religious Services:   . Active Member of Clubs or Organizations:   . Attends Archivist Meetings:   Marland Kitchen Marital Status:   Intimate Partner Violence:   . Fear of Current or Ex-Partner:   . Emotionally Abused:   Marland Kitchen Physically Abused:   . Sexually Abused:      Review of Systems: All other systems reviewed and are otherwise negative except as noted above.  Physical Exam: Vitals:    11/08/19 0917  BP: 116/64  Pulse: (!) 40  SpO2: 97%  Weight: 169 lb 12.8 oz (77 kg)  Height: _3  (1.753 m)    GEN- The patient is well appearing, alert and oriented x 3 today.   HEENT: normocephalic, atraumatic; sclera clear, conjunctiva pink; hearing intact; oropharynx clear; neck supple, no JVP Lymph- no cervical lymphadenopathy Lungs- Clear to ausculation bilaterally, normal work of breathing.  No wheezes, rales, rhonchi Heart- Regular rate  and rhythm, no murmurs, rubs or gallops, PMI not laterally displaced GI- soft, non-tender, non-distended, bowel sounds present, no hepatosplenomegaly Extremities- no clubbing, cyanosis, or edema; DP/PT/radial pulses 2+ bilaterally MS- no significant deformity or atrophy Skin- warm and dry, no rash or lesion Psych- euthymic mood, full affect Neuro- strength and sensation are intact  EKG is ordered. Personal review of EKG from today shows sinus bradycardia at 40 bpm with RBBB. PR interval 200 ms  Additional studies reviewed include: Previous office notes, myoview 01/2019, most recent labwork  Assessment and Plan:  1. Symptomatic bradycardia 14 day Zio patch showed lightheadedness associated with HRs in 30-40s, also showed episodes of NCT thought to be AT. We discussed the benefits and risk of PPM implantation at length. Pt and wife also met and discussed at length with Dr. [name]. He is aware that some of his symptoms may be coming from ectopy, and that a pacemaker would not improve this.   2. HTN Continue current regimen  3. AAA 3.4 cm last check. Follow.  4. CKD III Baseline ~ 1.8  Pt agrees to PPM placement and discussed at length with Dr. [name]. Will proceed with implantation at the next available time. Likely Monday, 11/11/2019.  Shirley Friar, PA-C  11/08/19 11:05 AM

## 2019-11-08 ENCOUNTER — Other Ambulatory Visit (HOSPITAL_COMMUNITY)
Admission: RE | Admit: 2019-11-08 | Discharge: 2019-11-08 | Disposition: A | Discharge status: Home / Self Care | Payer: Medicare HMO | Source: Ambulatory Visit | Attending: Internal Medicine | Admitting: Internal Medicine

## 2019-11-08 ENCOUNTER — Ambulatory Visit: Payer: Medicare HMO | Admitting: Student

## 2019-11-08 ENCOUNTER — Encounter: Payer: Self-pay | Admitting: Student

## 2019-11-08 ENCOUNTER — Other Ambulatory Visit: Payer: Self-pay

## 2019-11-08 VITALS — BP 116/64 | HR 40 | Ht 69.0 in | Wt 169.8 lb

## 2019-11-08 DIAGNOSIS — I10 Essential (primary) hypertension: Secondary | ICD-10-CM | POA: Diagnosis not present

## 2019-11-08 DIAGNOSIS — Z20822 Contact with and (suspected) exposure to covid-19: Secondary | ICD-10-CM | POA: Diagnosis not present

## 2019-11-08 DIAGNOSIS — Z01812 Encounter for preprocedural laboratory examination: Secondary | ICD-10-CM | POA: Insufficient documentation

## 2019-11-08 DIAGNOSIS — N183 Chronic kidney disease, stage 3 unspecified: Secondary | ICD-10-CM

## 2019-11-08 DIAGNOSIS — R001 Bradycardia, unspecified: Secondary | ICD-10-CM | POA: Diagnosis not present

## 2019-11-08 DIAGNOSIS — R42 Dizziness and giddiness: Secondary | ICD-10-CM

## 2019-11-08 DIAGNOSIS — I493 Ventricular premature depolarization: Secondary | ICD-10-CM

## 2019-11-08 LAB — CBC
Hematocrit: 34 % — ABNORMAL LOW (ref 37.5–51.0)
Hemoglobin: 11.4 g/dL — ABNORMAL LOW (ref 13.0–17.7)
MCH: 30.8 pg (ref 26.6–33.0)
MCHC: 33.5 g/dL (ref 31.5–35.7)
MCV: 92 fL (ref 79–97)
Platelets: 171 10*3/uL (ref 150–450)
RBC: 3.7 x10E6/uL — ABNORMAL LOW (ref 4.14–5.80)
RDW: 13.1 % (ref 11.6–15.4)
WBC: 8.7 10*3/uL (ref 3.4–10.8)

## 2019-11-08 LAB — BASIC METABOLIC PANEL
BUN/Creatinine Ratio: 24 (ref 10–24)
BUN: 47 mg/dL — ABNORMAL HIGH (ref 8–27)
CO2: 23 mmol/L (ref 20–29)
Calcium: 9.3 mg/dL (ref 8.6–10.2)
Chloride: 101 mmol/L (ref 96–106)
Creatinine, Ser: 1.95 mg/dL — ABNORMAL HIGH (ref 0.76–1.27)
GFR calc Af Amer: 37 mL/min/{1.73_m2} — ABNORMAL LOW (ref >59)
GFR calc non Af Amer: 32 mL/min/{1.73_m2} — ABNORMAL LOW (ref >59)
Glucose: 89 mg/dL (ref 65–99)
Potassium: 4.7 mmol/L (ref 3.5–5.2)
Sodium: 136 mmol/L (ref 134–144)

## 2019-11-08 LAB — SARS CORONAVIRUS 2 (TAT 6-24 HRS): SARS Coronavirus 2: NEGATIVE

## 2019-11-08 NOTE — Patient Instructions (Addendum)
Medication Instructions:  none *If you need a refill on your cardiac medications before your next appointment, please call your pharmacy*   Lab Work:TODAY CBC BMET If you have labs (blood work) drawn today and your tests are completely normal, you will receive your results only by: Marland Kitchen MyChart Message (if you have MyChart) OR . A paper copy in the mail If you have any lab test that is abnormal or we need to change your treatment, we will call you to review the results.   Testing/Procedures: Your physician has recommended that you have a pacemaker inserted. A pacemaker is a small device that is placed under the skin of your chest or abdomen to help control abnormal heart rhythms. This device uses electrical pulses to prompt the heart to beat at a normal rate. Pacemakers are used to treat heart rhythms that are too slow. Wire (leads) are attached to the pacemaker that goes into the chambers of you heart. This is done in the hospital and usually requires and overnight stay. Please see the instruction sheet given to you today for more information.     Follow-Up: 10-14 DAYS WOUND CHECK (PLEASE SCHEDULE)                       45 DAYS Dr [name] ([place]) At [facility], you and your health needs are our priority.  As part of our continuing mission to provide you with exceptional heart care, we have created designated Provider Care Teams.  These Care Teams include your primary Cardiologist (physician) and Advanced Practice Providers (APPs -  Physician Assistants and Nurse Practitioners) who all work together to provide you with the care you need, when you need it.     Other Instructions               PACEMAKER 11/11/19 Please arrive at [facility] at  11:00 am Do not eat or drink after midnight the night prior to the procedure Do not take any medications the morning of the test Plan for one night stay Will need someone to drive you home at  discharge  CoVid Test:  [place]                      [employer]

## 2019-11-11 ENCOUNTER — Other Ambulatory Visit: Payer: Self-pay

## 2019-11-11 ENCOUNTER — Ambulatory Visit (HOSPITAL_COMMUNITY)
Admission: RE | Admit: 2019-11-11 | Discharge: 2019-11-11 | Disposition: A | Discharge status: Home / Self Care | Payer: Medicare HMO | Attending: Internal Medicine | Admitting: Internal Medicine

## 2019-11-11 ENCOUNTER — Ambulatory Visit (HOSPITAL_COMMUNITY)
Admission: RE | Disposition: A | Discharge status: Home / Self Care | Payer: Medicare HMO | Source: Home / Self Care | Attending: Internal Medicine

## 2019-11-11 ENCOUNTER — Ambulatory Visit: Payer: Medicare HMO | Admitting: Interventional Cardiology

## 2019-11-11 ENCOUNTER — Ambulatory Visit (HOSPITAL_COMMUNITY): Payer: Medicare HMO

## 2019-11-11 DIAGNOSIS — I700 Atherosclerosis of aorta: Secondary | ICD-10-CM | POA: Diagnosis not present

## 2019-11-11 DIAGNOSIS — I493 Ventricular premature depolarization: Secondary | ICD-10-CM | POA: Insufficient documentation

## 2019-11-11 DIAGNOSIS — Z8546 Personal history of malignant neoplasm of prostate: Secondary | ICD-10-CM | POA: Insufficient documentation

## 2019-11-11 DIAGNOSIS — Z85820 Personal history of malignant melanoma of skin: Secondary | ICD-10-CM | POA: Insufficient documentation

## 2019-11-11 DIAGNOSIS — E785 Hyperlipidemia, unspecified: Secondary | ICD-10-CM | POA: Diagnosis not present

## 2019-11-11 DIAGNOSIS — I714 Abdominal aortic aneurysm, without rupture: Secondary | ICD-10-CM | POA: Insufficient documentation

## 2019-11-11 DIAGNOSIS — N183 Chronic kidney disease, stage 3 unspecified: Secondary | ICD-10-CM | POA: Insufficient documentation

## 2019-11-11 DIAGNOSIS — Z885 Allergy status to narcotic agent status: Secondary | ICD-10-CM | POA: Insufficient documentation

## 2019-11-11 DIAGNOSIS — Z888 Allergy status to other drugs, medicaments and biological substances status: Secondary | ICD-10-CM | POA: Diagnosis not present

## 2019-11-11 DIAGNOSIS — Z950 Presence of cardiac pacemaker: Secondary | ICD-10-CM

## 2019-11-11 DIAGNOSIS — Z79899 Other long term (current) drug therapy: Secondary | ICD-10-CM | POA: Diagnosis not present

## 2019-11-11 DIAGNOSIS — Z880 Allergy status to penicillin: Secondary | ICD-10-CM | POA: Diagnosis not present

## 2019-11-11 DIAGNOSIS — Z87891 Personal history of nicotine dependence: Secondary | ICD-10-CM | POA: Diagnosis not present

## 2019-11-11 DIAGNOSIS — I495 Sick sinus syndrome: Secondary | ICD-10-CM | POA: Diagnosis not present

## 2019-11-11 DIAGNOSIS — I129 Hypertensive chronic kidney disease with stage 1 through stage 4 chronic kidney disease, or unspecified chronic kidney disease: Secondary | ICD-10-CM | POA: Insufficient documentation

## 2019-11-11 DIAGNOSIS — R001 Bradycardia, unspecified: Secondary | ICD-10-CM | POA: Diagnosis present

## 2019-11-11 HISTORY — PX: PACEMAKER IMPLANT: EP1218

## 2019-11-11 SURGERY — PACEMAKER IMPLANT

## 2019-11-11 MED ORDER — HEPARIN (PORCINE) IN NACL 1000-0.9 UT/500ML-% IV SOLN
INTRAVENOUS | Status: DC | PRN
  Administered 2019-11-11: 500 mL

## 2019-11-11 MED ORDER — FENTANYL CITRATE (PF) 100 MCG/2ML IJ SOLN
INTRAMUSCULAR | Status: DC | PRN
  Administered 2019-11-11: 25 ug via INTRAVENOUS

## 2019-11-11 MED ORDER — HEPARIN (PORCINE) IN NACL 1000-0.9 UT/500ML-% IV SOLN
INTRAVENOUS | Status: AC
  Filled 2019-11-11: qty 500

## 2019-11-11 MED ORDER — SODIUM CHLORIDE 0.9 % IV SOLN: INTRAVENOUS | Status: DC

## 2019-11-11 MED ORDER — MIDAZOLAM HCL 5 MG/5ML IJ SOLN
INTRAMUSCULAR | Status: DC | PRN
  Administered 2019-11-11 (×2): 1 mg via INTRAVENOUS

## 2019-11-11 MED ORDER — CEFAZOLIN SODIUM-DEXTROSE 2-4 GM/100ML-% IV SOLN
2.0000 g | Freq: Once | INTRAVENOUS | Status: AC
  Administered 2019-11-11: 2 g via INTRAVENOUS
  Filled 2019-11-11: qty 100

## 2019-11-11 MED ORDER — YOU HAVE A PACEMAKER BOOK
Status: AC
  Filled 2019-11-11: qty 1

## 2019-11-11 MED ORDER — ONDANSETRON HCL 4 MG/2ML IJ SOLN: 4.0000 mg | Freq: Four times a day (QID) | INTRAMUSCULAR | Status: DC | PRN

## 2019-11-11 MED ORDER — LIDOCAINE HCL 1 % IJ SOLN
INTRAMUSCULAR | Status: AC
  Filled 2019-11-11: qty 20

## 2019-11-11 MED ORDER — CEFAZOLIN SODIUM-DEXTROSE 1-4 GM/50ML-% IV SOLN
1.0000 g | INTRAVENOUS | Status: DC
  Filled 2019-11-11: qty 50

## 2019-11-11 MED ORDER — LIDOCAINE HCL (PF) 1 % IJ SOLN
INTRAMUSCULAR | Status: DC | PRN
  Administered 2019-11-11: 60 mL

## 2019-11-11 MED ORDER — ACETAMINOPHEN 325 MG PO TABS: 325.0000 mg | ORAL_TABLET | ORAL | Status: DC | PRN

## 2019-11-11 MED ORDER — CHLORHEXIDINE GLUCONATE 4 % EX LIQD
4.0000 "application " | Freq: Once | CUTANEOUS | Status: DC
  Filled 2019-11-11: qty 60

## 2019-11-11 MED ORDER — VANCOMYCIN HCL IN DEXTROSE 1-5 GM/200ML-% IV SOLN
1000.0000 mg | INTRAVENOUS | Status: DC
  Filled 2019-11-11: qty 200

## 2019-11-11 MED ORDER — CEFAZOLIN SODIUM-DEXTROSE 2-4 GM/100ML-% IV SOLN
INTRAVENOUS | Status: AC
  Filled 2019-11-11: qty 100

## 2019-11-11 MED ORDER — SODIUM CHLORIDE 0.9 % IV SOLN
80.0000 mg | INTRAVENOUS | Status: AC
  Administered 2019-11-11: 80 mg

## 2019-11-11 MED ORDER — SODIUM CHLORIDE 0.9 % IV SOLN
INTRAVENOUS | Status: AC
  Filled 2019-11-11: qty 2

## 2019-11-11 MED ORDER — FENTANYL CITRATE (PF) 100 MCG/2ML IJ SOLN
INTRAMUSCULAR | Status: AC
  Filled 2019-11-11: qty 2

## 2019-11-11 MED ORDER — MIDAZOLAM HCL 5 MG/5ML IJ SOLN
INTRAMUSCULAR | Status: AC
  Filled 2019-11-11: qty 5

## 2019-11-11 SURGICAL SUPPLY — 9 items
CABLE SURGICAL S-101-97-12 (CABLE) ×2 IMPLANT
HEMOSTAT SURGICEL 2X4 FIBR (HEMOSTASIS) ×1 IMPLANT
IPG PACE AZUR XT DR MRI W1DR01 (Pacemaker) IMPLANT
LEAD CAPSURE NOVUS 5076-52CM (Lead) ×1 IMPLANT
LEAD CAPSURE NOVUS 5076-58CM (Lead) ×1 IMPLANT
PACE AZURE XT DR MRI W1DR01 (Pacemaker) ×2 IMPLANT
PAD PRO RADIOLUCENT 2001M-C (PAD) ×2 IMPLANT
SHEATH 7FR PRELUDE SNAP 13 (SHEATH) ×2 IMPLANT
TRAY PACEMAKER INSERTION (PACKS) ×2 IMPLANT

## 2019-11-11 NOTE — Progress Notes (Signed)
Dr [name] notified 1gm Ancef not given and notified of CXR results and no new orders and ok to d/c home

## 2019-11-11 NOTE — Progress Notes (Signed)
Dr [name] in and increased IV rate to 200cc/hr  IV left arm and per Dr [name] started IV fluids at 50cc/hr right arm

## 2019-11-11 NOTE — H&P (Signed)
Patient Care Team: Hulan Fess, MD as PCP - General (Family Medicine) Jettie Booze, MD as PCP - Cardiology (Cardiology)   HPI  Victor Stark is a 79 y.o. male seen last week with symptomatic bradycardia and sinus node dysfunction but without significant chronotropic incompetence, ie HR > 100 on monitor  Symptoms also assoc with PVCs   Myoview 8/20 normal LV function and no ischemia  Records and Results Reviewed    Past Medical History:  Diagnosis Date  . AAA (abdominal aortic aneurysm) ([place]) 10/21/2013   3.1 in 1/14.   Marland Kitchen Actinic keratoses    Dr. Rozann Lesches  . Aortic sclerosis 01/14/2016  . Bradycardia 10/29/2014  . Chronic kidney disease, stage III (moderate)    Dr. Welton Flakes  . Cough 03/22/2013   Followed in Pulmonary clinic/ [facility]/ [place] - try off acei 03/22/2013  > improved 04/19/2013    . Diverticulitis 2012  . Ganglion cyst of wrist    left- Dr. Daylene Katayama  . Headache(784.0) 06/06/2013  . HTN (hypertension) 04/19/2013   D/c ACEi 03/23/13 due to cough    . Hx of cardiovascular stress test    LexiScan Myoview (9/15):  Normal study, EF 49% (visually looks normal; quantifies mildly decreased).  // Myoview 01/2019: EF 62, no ischemia or scar, low risk   . Hyperlipidemia   . Hypertension   . Melanoma ([place]) 2013   Dr. Rozann Lesches  . Prostate cancer ([place])   . PVC's (premature ventricular contractions) 10/21/2013   Monitor in 2014 revealed normal sinus rhythm with PVCs   . Solitary pulmonary nodule 03/23/2013   First detected 07/04/12 - See CT and f/u 09/12/12 improved    . Spondylosis    Dr. Brien Few    Past Surgical History:  Procedure Laterality Date  . LUMBAR DISC SURGERY    . PROSTATECTOMY    . ROTATOR CUFF REPAIR  Jan 2014  . SHOULDER SURGERY      Current Facility-Administered Medications  Medication Dose Route Frequency Provider Last Rate Last Admin  . 0.9 %  sodium chloride infusion   Intravenous Continuous Shirley Friar, PA-C 50 mL/hr  at 11/11/19 1205 New Bag at 11/11/19 1205  . ceFAZolin (ANCEF) IVPB 2g/100 mL premix  2 g Intravenous Once Deboraha Sprang, MD      . chlorhexidine (HIBICLENS) 4 % liquid 4 application  4 application Topical Once Shirley Friar, PA-C      . gentamicin (GARAMYCIN) 80 mg in sodium chloride 0.9 % 500 mL irrigation  80 mg Irrigation On Call Shirley Friar, PA-C      . vancomycin (VANCOCIN) IVPB 1000 mg/200 mL premix  1,000 mg Intravenous On Call Shirley Friar, PA-C        Allergies  Allergen Reactions  . Ace Inhibitors Cough  . Etodolac Hives and Swelling  . Hydrocodone-Acetaminophen Swelling and Hives  . Lipitor [Atorvastatin Calcium] Other (See Comments)    Muscle pain/fatigue  . Lisinopril Other (See Comments)    Dry cough   . Pravastatin Other (See Comments)    Muscle aches   . Vicodin [Hydrocodone-Acetaminophen] Hives and Swelling  . Dutasteride Other (See Comments)    Lack of therapeutic effect   . Amoxicillin Rash      Social History   Tobacco Use  . Smoking status: Former Smoker    Packs/day: 0.75    Years: 15.00    Pack years: 11.25    Types: Cigarettes    Quit  date: 06/20/1992    Years since quitting: 27.4  . Smokeless tobacco: Never Used  Substance Use Topics  . Alcohol use: Yes    Comment: rare  . Drug use: No     Family History  Problem Relation Age of Onset  . Heart attack Father   . Asthma Sister   . Heart attack Daughter 41     Current Meds  Medication Sig  . acetaminophen (TYLENOL) 325 MG tablet Take 325-650 mg by mouth every 6 (six) hours as needed (pain.).   Marland Kitchen amLODipine (NORVASC) 2.5 MG tablet Take 2.5 mg by mouth daily.  Marland Kitchen Bioflavonoid Products (ESTER C PO) Take 500 mg by mouth daily as needed (immune support).   . Cholecalciferol (VITAMIN D) 2000 UNITS CAPS Take 2,000 Units by mouth daily.   . Coenzyme Q10 200 MG capsule Take 200 mg by mouth daily.  . Multiple Vitamin (MULTIVITAMIN WITH MINERALS) TABS Take 1 tablet  by mouth daily. Centrum Silver  . Omega-3 Fatty Acids (OMEGA-3 EPA FISH OIL PO) Take 950 mg by mouth daily.  . rosuvastatin (CRESTOR) 20 MG tablet Take 20 mg by mouth at bedtime.   Marland Kitchen telmisartan-hydrochlorothiazide (MICARDIS HCT) 80-25 MG tablet Take 1 tablet by mouth daily.  . traZODone (DESYREL) 50 MG tablet Take 50 mg by mouth at bedtime.     Review of Systems negative except from HPI and PMH  Physical Exam BP 131/63   Pulse (!) 40   Temp 98 F (36.7 C) (Skin)   Resp 17   Ht 5' 8.5" (1.74 m)   Wt 73.5 kg   SpO2 99%   BMI 24.27 kg/m  Well developed and well nourished in no acute distress HENT normal E scleral and icterus clear Neck Supple JVP flat; carotids brisk and full Clear to ausculation  Regular rate and rhythm, no murmurs gallops or rub Soft with active bowel sounds No clubbing cyanosis  Edema Alert and oriented, grossly normal motor and sensory function Skin Warm and Dry  5/21 sinus @ 40  20/14/90 RBBB  Assessment and  Plan Sinus bradycardia  Hypertension   PVCs    Symptoms with both sinus brady cardia and PVCs    With daytime rates < 30s reasonable to pursue pacing for relief of symptoms, although it will also likely require adjunctive therapy for the PCVs  The benefits and risks were reviewed including but not limited to death,  perforation, infection, lead dislodgement and device malfunction.  The patient understands agrees and is willing to proceed.

## 2019-11-11 NOTE — Discharge Instructions (Signed)
After Your Pacemaker   . You have Medtronic Pacemaker  ACTIVITY . Do not lift your arm above shoulder height for 1 week after your procedure. After 7 days, you may progress as below.     Monday Nov 18, 2019  Tuesday November 19, 2019 Wednesday November 20, 2019 Thursday November 21, 2019   . Do not lift, push, pull, or carry anything over 10 pounds with the affected arm until 6 weeks (Monday December 23, 2019 ) after your procedure.   . Do NOT DRIVE until you have been seen for your wound check, or as long as instructed by your healthcare provider.   . Ask your healthcare provider when you can go back to work   INCISION/Dressing .   Marland Kitchen Monitor your Pacemaker site for redness, swelling, and drainage. Call the device clinic at [phone] if you experience these symptoms or fever/chills.  . You may shower tomorrow after 7pm  . Avoid lotions, ointments, or perfumes over your incision until it is well-healed.  . You may use a hot tub or a pool AFTER your wound check appointment if the incision is completely closed.  Marland Kitchen PAcemaker Alerts:  Some alerts are vibratory and others beep. These are NOT emergencies. Please call our office to let us know. If this occurs at night or on weekends, it can wait until the next business day. Send a remote transmission.  . If your device is capable of reading fluid status (for heart failure), you will be offered monthly monitoring to review this with you.   DEVICE MANAGEMENT . Remote monitoring is used to monitor your pacemaker from home. This monitoring is scheduled every 91 days by our office. It allows [place] to keep an eye on the functioning of your device to ensure it is working properly. You will routinely see your Electrophysiologist annually (more often if necessary).   . You should receive your ID card for your new device in 4-8 weeks. Keep this card with you at all times once received. Consider wearing a medical alert bracelet or necklace.  . Your Pacemaker may be  MRI compatible. This will be discussed at your next office visit/wound check.  You should avoid contact with strong electric or magnetic fields.    Do not use amateur (ham) radio equipment or electric (arc) welding torches. MP3 player headphones with magnets should not be used. Some devices are safe to use if held at least 12 inches (30 cm) from your Pacemaker. These include power tools, lawn mowers, and speakers. If you are unsure if something is safe to use, ask your health care provider.   When using your cell phone, hold it to the ear that is on the opposite side from the Pacemaker. Do not leave your cell phone in a pocket over the Pacemaker.   You may safely use electric blankets, heating pads, computers, and microwave ovens.  Call the office right away if:  You have chest pain.  You feel more short of breath than you have felt before.  You feel more light-headed than you have felt before.  Your incision starts to open up.  This information is not intended to replace advice given to you by your health care provider. Make sure you discuss any questions you have with your health care provider.

## 2019-11-11 NOTE — Progress Notes (Signed)
Paged Dr [name] to see before discharge

## 2019-11-12 MED FILL — Lidocaine HCl Local Inj 1%: INTRAMUSCULAR | Qty: 60 | Status: AC

## 2019-11-21 ENCOUNTER — Other Ambulatory Visit: Payer: Self-pay

## 2019-11-21 ENCOUNTER — Ambulatory Visit (INDEPENDENT_AMBULATORY_CARE_PROVIDER_SITE_OTHER): Payer: Medicare HMO | Admitting: Emergency Medicine

## 2019-11-21 DIAGNOSIS — N183 Chronic kidney disease, stage 3 unspecified: Secondary | ICD-10-CM | POA: Diagnosis not present

## 2019-11-21 DIAGNOSIS — Z950 Presence of cardiac pacemaker: Secondary | ICD-10-CM

## 2019-11-21 DIAGNOSIS — R001 Bradycardia, unspecified: Secondary | ICD-10-CM

## 2019-11-21 LAB — BASIC METABOLIC PANEL
BUN/Creatinine Ratio: 26 — ABNORMAL HIGH (ref 10–24)
BUN: 50 mg/dL — ABNORMAL HIGH (ref 8–27)
CO2: 25 mmol/L (ref 20–29)
Calcium: 9.7 mg/dL (ref 8.6–10.2)
Chloride: 98 mmol/L (ref 96–106)
Creatinine, Ser: 1.9 mg/dL — ABNORMAL HIGH (ref 0.76–1.27)
GFR calc Af Amer: 38 mL/min/{1.73_m2} — ABNORMAL LOW (ref >59)
GFR calc non Af Amer: 33 mL/min/{1.73_m2} — ABNORMAL LOW (ref >59)
Glucose: 91 mg/dL (ref 65–99)
Potassium: 4.6 mmol/L (ref 3.5–5.2)
Sodium: 138 mmol/L (ref 134–144)

## 2019-11-21 LAB — CUP PACEART INCLINIC DEVICE CHECK
Battery Remaining Longevity: 145 mo
Battery Voltage: 3.22 V
Brady Statistic AP VP Percent: 0.11 %
Brady Statistic AP VS Percent: 90.49 %
Brady Statistic AS VP Percent: 0 %
Brady Statistic AS VS Percent: 9.4 %
Brady Statistic RA Percent Paced: 97.24 %
Brady Statistic RV Percent Paced: 0.12 %
Date Time Interrogation Session: 20210603100924
Implantable Lead Implant Date: 20210524
Implantable Lead Implant Date: 20210524
Implantable Lead Location: 753859
Implantable Lead Location: 753860
Implantable Lead Model: 5076
Implantable Lead Model: 5076
Implantable Pulse Generator Implant Date: 20210524
Lead Channel Impedance Value: 361 Ohm
Lead Channel Impedance Value: 456 Ohm
Lead Channel Impedance Value: 475 Ohm
Lead Channel Impedance Value: 703 Ohm
Lead Channel Pacing Threshold Amplitude: 0.5 V
Lead Channel Pacing Threshold Amplitude: 1 V
Lead Channel Pacing Threshold Pulse Width: 0.4 ms
Lead Channel Pacing Threshold Pulse Width: 0.4 ms
Lead Channel Sensing Intrinsic Amplitude: 9 mV
Lead Channel Setting Pacing Amplitude: 3.5 V
Lead Channel Setting Pacing Amplitude: 3.5 V
Lead Channel Setting Pacing Pulse Width: 0.4 ms
Lead Channel Setting Sensing Sensitivity: 1.2 mV

## 2019-11-21 NOTE — Progress Notes (Signed)
Wound check appointment. Dermabond partially removed. Wound without redness or edema. Incision edges approximated, wound healing well. Normal device function. Thresholds, sensing, and impedances consistent with implant measurements. Device programmed at 3.5V with auto capture on for extra safety margin until 3 month visit. Histogram distribution appropriate for patient and level of activity. No mode switches or high ventricular rates noted. AT/AF daily burden alert turned on. Patient educated about wound care, arm mobility, lifting restrictions, and Carelink monitor. Carelink on 02/11/20 and ROV with SK on 02/18/20.

## 2019-12-02 ENCOUNTER — Telehealth: Payer: Self-pay | Admitting: Interventional Cardiology

## 2019-12-02 NOTE — Telephone Encounter (Signed)
Victor Stark is calling stating he is having a issue since his last wound check on 11/21/19 and would like to speak with a nurse to see if it is normal. Please advise.

## 2019-12-02 NOTE — Telephone Encounter (Signed)
Picture received. Wound appears to have possible edema  And redness at incision site. Patient will come in to be seen in device clinic 12/03/19.

## 2019-12-02 NOTE — Telephone Encounter (Signed)
Patient  Reports a "burning" sensation at lower porting of wound site. Area is not warm to touch, no drainage or redness at wound site. Reports discomfort when he palpates area of device. Reports no fever or chills.Will send picture of wound site for evaluation.

## 2019-12-03 ENCOUNTER — Other Ambulatory Visit: Payer: Self-pay

## 2019-12-03 ENCOUNTER — Ambulatory Visit (INDEPENDENT_AMBULATORY_CARE_PROVIDER_SITE_OTHER): Payer: Medicare HMO | Admitting: Emergency Medicine

## 2019-12-03 DIAGNOSIS — Z950 Presence of cardiac pacemaker: Secondary | ICD-10-CM

## 2019-12-03 NOTE — Progress Notes (Signed)
Wound check due to reports ofburning sensation at wound site. Incision well healed, no edema, redness, or drainage from wound site. Pacemaker pocket palpated with no report of pain with palpation. Patient reports sporadic burning sensation and stinging sensation in area of the device. Education done on s/sx of infection.

## 2019-12-03 NOTE — Patient Instructions (Addendum)
Call if you develop swelling, pain, drainage or redness at wound site.

## 2019-12-09 DIAGNOSIS — Z85820 Personal history of malignant melanoma of skin: Secondary | ICD-10-CM | POA: Diagnosis not present

## 2019-12-09 DIAGNOSIS — X32XXXD Exposure to sunlight, subsequent encounter: Secondary | ICD-10-CM | POA: Diagnosis not present

## 2019-12-09 DIAGNOSIS — Z1283 Encounter for screening for malignant neoplasm of skin: Secondary | ICD-10-CM | POA: Diagnosis not present

## 2019-12-09 DIAGNOSIS — Z08 Encounter for follow-up examination after completed treatment for malignant neoplasm: Secondary | ICD-10-CM | POA: Diagnosis not present

## 2019-12-09 DIAGNOSIS — L570 Actinic keratosis: Secondary | ICD-10-CM | POA: Diagnosis not present

## 2019-12-20 ENCOUNTER — Telehealth: Payer: Self-pay | Admitting: Interventional Cardiology

## 2019-12-20 NOTE — Telephone Encounter (Signed)
Patient concerned because automatic BP monitor gave him a pulse reading in the 30's on back to back readings.He is asymptomatic. Patient was assisted with sending manual transmission which showed a presenting EGM of AP/VS @ 81 bpm. with PVC. Reassured patient that pacemaker function is WNL with  no alerts. Education done that automatic BP cuffs can be inaccurate with pulse readings if the rhythm is irregular with PVCs or AF and that the pacemaker is working fine.

## 2019-12-20 NOTE — Telephone Encounter (Signed)
STAT if HR is under 50 or over 120 (normal HR is 60-100 beats per minute)  1) What is your heart rate? 37  2) Do you have a log of your heart rate readings (document readings)? no  3) Do you have any other symptoms? no   Patient states his HR has been in the 70's normally, but today it is 37. He states he does not have any other symptoms.

## 2020-01-17 ENCOUNTER — Telehealth: Payer: Self-pay

## 2020-01-17 NOTE — Telephone Encounter (Signed)
Spoke with pt.  He reports his BP result was 98/65.  He reportedly feels fine right now although has had episodes of feeling lightheaded in the past week.  Pt asked what his BP reading was on recent Pacer transmission.  Explained to patient that the Pacemaker does not measure BP.  Advised patient that current BP reading he has reported is not too low, provided he is not symptomatic.  Ensure adequate hydration.  Keep log to bring to Dr. Irish Lack next week during appt.

## 2020-01-17 NOTE — Telephone Encounter (Signed)
The pt states he got low readings on his blood pressure cuff. He wanted to see what the pacemaker reading is saying. I had him send a transmission for the nurse to review.

## 2020-01-29 DIAGNOSIS — N1832 Chronic kidney disease, stage 3b: Secondary | ICD-10-CM | POA: Diagnosis not present

## 2020-01-31 ENCOUNTER — Other Ambulatory Visit (HOSPITAL_COMMUNITY): Payer: Medicare HMO

## 2020-01-31 ENCOUNTER — Ambulatory Visit (HOSPITAL_COMMUNITY)
Admission: RE | Admit: 2020-01-31 | Discharge: 2020-01-31 | Disposition: A | Discharge status: Home / Self Care | Payer: Medicare HMO | Source: Ambulatory Visit | Attending: Cardiovascular Disease | Admitting: Cardiovascular Disease

## 2020-01-31 ENCOUNTER — Other Ambulatory Visit: Payer: Self-pay

## 2020-01-31 ENCOUNTER — Other Ambulatory Visit (HOSPITAL_COMMUNITY): Payer: Self-pay | Admitting: Interventional Cardiology

## 2020-01-31 DIAGNOSIS — I714 Abdominal aortic aneurysm, without rupture, unspecified: Secondary | ICD-10-CM

## 2020-02-04 DIAGNOSIS — N2581 Secondary hyperparathyroidism of renal origin: Secondary | ICD-10-CM | POA: Diagnosis not present

## 2020-02-04 DIAGNOSIS — I129 Hypertensive chronic kidney disease with stage 1 through stage 4 chronic kidney disease, or unspecified chronic kidney disease: Secondary | ICD-10-CM | POA: Diagnosis not present

## 2020-02-04 DIAGNOSIS — N1832 Chronic kidney disease, stage 3b: Secondary | ICD-10-CM | POA: Diagnosis not present

## 2020-02-04 DIAGNOSIS — D631 Anemia in chronic kidney disease: Secondary | ICD-10-CM | POA: Diagnosis not present

## 2020-02-05 ENCOUNTER — Telehealth: Payer: Self-pay | Admitting: Interventional Cardiology

## 2020-02-05 NOTE — Telephone Encounter (Signed)
The patient has been notified of the result and verbalized understanding.  All questions (if any) were answered. Repeat US already ordered by vascular tech. Cleon Gustin, RN 02/05/2020 1:52 PM

## 2020-02-05 NOTE — Telephone Encounter (Signed)
-----   Message from Jettie Booze, MD sent at 02/04/2020 11:10 AM EDT ----- Mild increase in AAA size.  3.8 cm now- still a long way from needing intervention, repeat study in 1 year.

## 2020-02-05 NOTE — Telephone Encounter (Signed)
    Pt is returning call from [place] for his AAA duplex result

## 2020-02-07 DIAGNOSIS — Z8269 Family history of other diseases of the musculoskeletal system and connective tissue: Secondary | ICD-10-CM | POA: Diagnosis not present

## 2020-02-11 ENCOUNTER — Ambulatory Visit (INDEPENDENT_AMBULATORY_CARE_PROVIDER_SITE_OTHER): Payer: Medicare HMO | Admitting: *Deleted

## 2020-02-11 DIAGNOSIS — I495 Sick sinus syndrome: Secondary | ICD-10-CM

## 2020-02-11 LAB — CUP PACEART REMOTE DEVICE CHECK
Battery Remaining Longevity: 121 mo
Battery Voltage: 3.18 V
Brady Statistic AP VP Percent: 0.04 %
Brady Statistic AP VS Percent: 94.31 %
Brady Statistic AS VP Percent: 0 %
Brady Statistic AS VS Percent: 5.65 %
Brady Statistic RA Percent Paced: 94.33 %
Brady Statistic RV Percent Paced: 0.04 %
Date Time Interrogation Session: 20210824014524
Implantable Lead Implant Date: 20210524
Implantable Lead Implant Date: 20210524
Implantable Lead Location: 753859
Implantable Lead Location: 753860
Implantable Lead Model: 5076
Implantable Lead Model: 5076
Implantable Pulse Generator Implant Date: 20210524
Lead Channel Impedance Value: 323 Ohm
Lead Channel Impedance Value: 456 Ohm
Lead Channel Impedance Value: 570 Ohm
Lead Channel Impedance Value: 817 Ohm
Lead Channel Pacing Threshold Amplitude: 0.5 V
Lead Channel Pacing Threshold Amplitude: 0.625 V
Lead Channel Pacing Threshold Pulse Width: 0.4 ms
Lead Channel Pacing Threshold Pulse Width: 0.4 ms
Lead Channel Sensing Intrinsic Amplitude: 5.25 mV
Lead Channel Sensing Intrinsic Amplitude: 5.25 mV
Lead Channel Sensing Intrinsic Amplitude: 8.25 mV
Lead Channel Sensing Intrinsic Amplitude: 8.25 mV
Lead Channel Setting Pacing Amplitude: 3.25 V
Lead Channel Setting Pacing Amplitude: 3.25 V
Lead Channel Setting Pacing Pulse Width: 0.4 ms
Lead Channel Setting Sensing Sensitivity: 1.2 mV

## 2020-02-12 DIAGNOSIS — M19041 Primary osteoarthritis, right hand: Secondary | ICD-10-CM | POA: Diagnosis not present

## 2020-02-12 DIAGNOSIS — N183 Chronic kidney disease, stage 3 unspecified: Secondary | ICD-10-CM | POA: Diagnosis not present

## 2020-02-12 DIAGNOSIS — I10 Essential (primary) hypertension: Secondary | ICD-10-CM | POA: Diagnosis not present

## 2020-02-12 DIAGNOSIS — Z8546 Personal history of malignant neoplasm of prostate: Secondary | ICD-10-CM | POA: Diagnosis not present

## 2020-02-12 DIAGNOSIS — C61 Malignant neoplasm of prostate: Secondary | ICD-10-CM | POA: Diagnosis not present

## 2020-02-12 DIAGNOSIS — M19042 Primary osteoarthritis, left hand: Secondary | ICD-10-CM | POA: Diagnosis not present

## 2020-02-12 DIAGNOSIS — E782 Mixed hyperlipidemia: Secondary | ICD-10-CM | POA: Diagnosis not present

## 2020-02-12 DIAGNOSIS — E1122 Type 2 diabetes mellitus with diabetic chronic kidney disease: Secondary | ICD-10-CM | POA: Diagnosis not present

## 2020-02-17 NOTE — Progress Notes (Signed)
Patient Care Team: Hulan Fess, MD as PCP - General (Family Medicine) Jettie Booze, MD as PCP - Cardiology (Cardiology)   HPI  Victor Stark is a 79 y.o. male seen in followup for Medtronic  pacemaker implanted 5/21 for symptomatic bradycardia ( HR<40) and PVCs  Myoview 8/20 normal LV function and no ischemia  Much improved.  No shortness of breath.  No edema.  No lightheadedness.   kidney doctor since creatinine is much improved  Date Cr K Hgb  6/21 1.9 4.6 11.4          Records and Results Reviewed   Past Medical History:  Diagnosis Date  . AAA (abdominal aortic aneurysm) ([place]) 10/21/2013   3.1 in 1/14.   Marland Kitchen Actinic keratoses    Dr. Rozann Lesches  . Aortic sclerosis 01/14/2016  . Bradycardia 10/29/2014  . Chronic kidney disease, stage III (moderate)    Dr. Welton Flakes  . Cough 03/22/2013   Followed in Pulmonary clinic/ [facility]/ [place] - try off acei 03/22/2013  > improved 04/19/2013    . Diverticulitis 2012  . Ganglion cyst of wrist    left- Dr. Daylene Katayama  . Headache(784.0) 06/06/2013  . HTN (hypertension) 04/19/2013   D/c ACEi 03/23/13 due to cough    . Hx of cardiovascular stress test    LexiScan Myoview (9/15):  Normal study, EF 49% (visually looks normal; quantifies mildly decreased).  // Myoview 01/2019: EF 62, no ischemia or scar, low risk   . Hyperlipidemia   . Hypertension   . Melanoma ([place]) 2013   Dr. Rozann Lesches  . Prostate cancer ([place])   . PVC's (premature ventricular contractions) 10/21/2013   Monitor in 2014 revealed normal sinus rhythm with PVCs   . Solitary pulmonary nodule 03/23/2013   First detected 07/04/12 - See CT and f/u 09/12/12 improved    . Spondylosis    Dr. Brien Few    Past Surgical History:  Procedure Laterality Date  . LUMBAR DISC SURGERY    . PACEMAKER IMPLANT N/A 11/11/2019   Procedure: PACEMAKER IMPLANT;  Surgeon: Deboraha Sprang, MD;  Location: [facility];  Service: Cardiovascular;  Laterality: N/A;  .  PROSTATECTOMY    . ROTATOR CUFF REPAIR  Jan 2014  . SHOULDER SURGERY      Current Meds  Medication Sig  . acetaminophen (TYLENOL) 325 MG tablet Take 325-650 mg by mouth every 6 (six) hours as needed (pain.).   Marland Kitchen amLODipine (NORVASC) 2.5 MG tablet Take 2.5 mg by mouth daily.  . Cholecalciferol (VITAMIN D) 2000 UNITS CAPS Take 2,000 Units by mouth daily.   . Coenzyme Q10 200 MG capsule Take 200 mg by mouth daily.  . Multiple Vitamin (MULTIVITAMIN WITH MINERALS) TABS Take 1 tablet by mouth daily. Centrum Silver  . Omega-3 Fatty Acids (OMEGA-3 EPA FISH OIL PO) Take 950 mg by mouth daily.  . rosuvastatin (CRESTOR) 20 MG tablet Take 20 mg by mouth at bedtime.   Marland Kitchen telmisartan-hydrochlorothiazide (MICARDIS HCT) 80-25 MG tablet Take 1 tablet by mouth daily.  . traZODone (DESYREL) 50 MG tablet Take 50 mg by mouth at bedtime.  . valACYclovir (VALTREX) 500 MG tablet Take 500 mg by mouth as needed (BREAKOUTS).     Allergies  Allergen Reactions  . Ace Inhibitors Cough  . Etodolac Hives and Swelling  . Hydrocodone-Acetaminophen Swelling and Hives  . Lipitor [Atorvastatin Calcium] Other (See Comments)    Muscle pain/fatigue  . Lisinopril Other (See Comments)    Dry  cough   . Pravastatin Other (See Comments)    Muscle aches   . Vicodin [Hydrocodone-Acetaminophen] Hives and Swelling  . Dutasteride Other (See Comments)    Lack of therapeutic effect   . Amoxicillin Rash      Review of Systems negative except from HPI and PMH  Physical Exam BP 96/60   Pulse 66   Ht 5' 8.5" (1.74 m)   Wt 167 lb (75.8 kg)   SpO2 98%   BMI 25.02 kg/m  Well developed and well nourished in no acute distress HENT normal E scleral and icterus clear Neck Supple JVP flat; carotids brisk and full Clear to ausculation Regular rate and rhythm, no murmurs gallops or rub Soft with active bowel sounds No clubbing cyanosis  Edema Alert and oriented, grossly normal motor and sensory function Skin Warm and  Dry  ECG apacing 66 14/15/44 RBBB  CrCl cannot be calculated (Patient's most recent lab result is older than the maximum 21 days allowed.).   Assessment and  Plan Sinus node dysfunction   HTN  Pacemaker-Medtronic  Blood pressures have gotten lower.  He is now off amlodipine.  We will decrease his telmisartan HCT 160/25>>80/12.5      Current medicines are reviewed at length with the patient today .  The patient does not  have concerns regarding medicines.

## 2020-02-17 NOTE — Progress Notes (Signed)
Remote pacemaker transmission.   

## 2020-02-18 ENCOUNTER — Encounter: Payer: Self-pay | Admitting: Internal Medicine

## 2020-02-18 ENCOUNTER — Ambulatory Visit: Payer: Medicare HMO | Admitting: Internal Medicine

## 2020-02-18 ENCOUNTER — Other Ambulatory Visit: Payer: Self-pay

## 2020-02-18 VITALS — BP 96/60 | HR 66 | Ht 68.5 in | Wt 167.0 lb

## 2020-02-18 DIAGNOSIS — I495 Sick sinus syndrome: Secondary | ICD-10-CM | POA: Diagnosis not present

## 2020-02-18 DIAGNOSIS — Z950 Presence of cardiac pacemaker: Secondary | ICD-10-CM | POA: Diagnosis not present

## 2020-02-18 MED ORDER — TELMISARTAN-HCTZ 80-25 MG PO TABS: 0.5000 | ORAL_TABLET | Freq: Every day | ORAL | 3 refills | Status: DC

## 2020-02-18 NOTE — Patient Instructions (Addendum)
Medication Instructions:  Your physician has recommended you make the following change in your medication:   DECREASE: temisartan-HCTZ 80-25 mg tablet: Take 1/2 tablet by mouth once a day  *If you need a refill on your cardiac medications before your next appointment, please call your pharmacy*   Lab Work: None  If you have labs (blood work) drawn today and your tests are completely normal, you will receive your results only by: Marland Kitchen MyChart Message (if you have MyChart) OR . A paper copy in the mail If you have any lab test that is abnormal or we need to change your treatment, we will call you to review the results.   Testing/Procedures: None   Follow-Up:  Remote monitoring is used to monitor your Pacemaker. This monitoring reduces the number of office visits required to check your device to one time per year. It allows [place] to keep an eye on the functioning of your device to ensure it is working properly. You are scheduled for a device check from home on 05/12/20. You may send your transmission at any time that day. If you have a wireless device, the transmission will be sent automatically. After your physician reviews your transmission, you will receive a postcard with your next transmission date.   At [facility], you and your health needs are our priority.  As part of our continuing mission to provide you with exceptional heart care, we have created designated Provider Care Teams.  These Care Teams include your primary Cardiologist (physician) and Advanced Practice Providers (APPs -  Physician Assistants and Nurse Practitioners) who all work together to provide you with the care you need, when you need it.  We recommend signing up for the patient portal called "MyChart".  Sign up information is provided on this After Visit Summary.  MyChart is used to connect with patients for Virtual Visits (Telemedicine).  Patients are able to view lab/test results, encounter notes, upcoming  appointments, etc.  Non-urgent messages can be sent to your provider as well.   To learn more about what you can do with MyChart, go to [email or web address].    Your next appointment:   9 month(s)  The format for your next appointment:   In Person  Provider:   Virl Axe, MD   Other Instructions None

## 2020-03-20 DIAGNOSIS — R69 Illness, unspecified: Secondary | ICD-10-CM | POA: Diagnosis not present

## 2020-03-31 NOTE — Progress Notes (Signed)
Cardiology Office Note   Date:  04/01/2020   ID:  Victor Stark, DOB 06-30-1940, MRN [id number]  PCP:  Hulan Fess, MD    No chief complaint on file.  Pacemaker  Wt Readings from Last 3 Encounters:  04/01/20 165 lb 12.8 oz (75.2 kg)  02/18/20 167 lb (75.8 kg)  11/11/19 162 lb (73.5 kg)       History of Present Illness: Victor Stark is a 79 y.o. male   with a history of HTN, HL, prostate CA, CKD, AAA. Saw PCP in 10/2013 for dizziness. Referred back for bradycardia. BP medications were adjusted some to see if this would help. He changed to Valsartan 160 QD, then Valsartan/HCTZ 160/12.5 mg QOD alt with Valsartan 160 QD. BP tended to run higher. Then went toValsartan/HCTZ 160/12.5 QD. BP optimal. No further dizziness.   He has a hx of vertigo. He says his symptoms were similar. He did call our office and was placed on a 24 Hr Holter. This demonstrated NSR, PVCs, one triplet. No significant arrhythmias.   He was alsoevaluated with an exercise treadmill test in late 2015. He did not achieve target heart rate. He then underwent a nuclear stress test showing no evidence of ischemia.  Since the last visit,he had some chest pain and was seen by Richardson Dopp. Stress test in 01/2019: "Normal resting and stress perfusion. No ischemia or infarction EF 62%"  Switched to telmisartan/HCTZ due to shortage.   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 [name].  Noticed improved stamina after pacer was placed.   Denies : Chest pain. Dizziness. Leg edema. Nitroglycerin use. Orthopnea. Palpitations. Paroxysmal nocturnal dyspnea. Shortness of breath. Syncope.   Walking for exercise.  Plays golf.  Eating healthy.  BP at home usually in the 062-694 systolic.     Past Medical History:  Diagnosis Date   AAA (abdominal aortic aneurysm) ([place]) 10/21/2013   3.1 in 1/14.    Actinic keratoses    Dr. Rozann Lesches   Aortic  sclerosis 01/14/2016   Bradycardia 10/29/2014   Chronic kidney disease, stage III (moderate) ([place])    Dr. Welton Flakes   Cough 03/22/2013   Followed in Pulmonary clinic/ [facility]/ [facility] - try off acei 03/22/2013  > improved 04/19/2013     Diverticulitis 2012   Ganglion cyst of wrist    left- Dr. Daylene Katayama   Headache(784.0) 06/06/2013   HTN (hypertension) 04/19/2013   D/c ACEi 03/23/13 due to cough     Hx of cardiovascular stress test    LexiScan Myoview (9/15):  Normal study, EF 49% (visually looks normal; quantifies mildly decreased).  // Myoview 01/2019: EF 62, no ischemia or scar, low risk    Hyperlipidemia    Hypertension    Melanoma ([place]) 2013   Dr. Rozann Lesches   Prostate cancer [facility])    PVC's (premature ventricular contractions) 10/21/2013   Monitor in 2014 revealed normal sinus rhythm with PVCs    Solitary pulmonary nodule 03/23/2013   First detected 07/04/12 - See CT and f/u 09/12/12 improved     Spondylosis    Dr. Brien Few    Past Surgical History:  Procedure Laterality Date   LUMBAR [place] IMPLANT N/A 11/11/2019   Procedure: PACEMAKER IMPLANT;  Surgeon: Deboraha Sprang, MD;  Location: [place] [facility];  Service: Cardiovascular;  Laterality: N/A;   PROSTATECTOMY     ROTATOR CUFF REPAIR  Jan 2014  SHOULDER SURGERY       Current Outpatient Medications  Medication Sig Dispense Refill   acetaminophen (TYLENOL) 325 MG tablet Take 325-650 mg by mouth every 6 (six) hours as needed (pain.).      allopurinol (ZYLOPRIM) 100 MG tablet Take 1 tablet by mouth every other day.     Cholecalciferol (VITAMIN D) 2000 UNITS CAPS Take 2,000 Units by mouth daily.      Coenzyme Q10 200 MG capsule Take 200 mg by mouth daily.     Multiple Vitamin (MULTIVITAMIN WITH MINERALS) TABS Take 1 tablet by mouth daily. Centrum Silver     Omega-3 Fatty Acids (OMEGA-3 EPA FISH OIL PO) Take 950 mg by mouth daily.     rosuvastatin (CRESTOR) 20 MG tablet Take 20 mg  by mouth at bedtime.      telmisartan-hydrochlorothiazide (MICARDIS HCT) 80-25 MG tablet Take 0.5 tablets by mouth daily. 45 tablet 3   traZODone (DESYREL) 50 MG tablet Take 50 mg by mouth at bedtime.     valACYclovir (VALTREX) 500 MG tablet Take 500 mg by mouth as needed (BREAKOUTS).      No current facility-administered medications for this visit.    Allergies:   Ace inhibitors, Etodolac, Hydrocodone-acetaminophen, Lipitor [atorvastatin calcium], Lisinopril, Pravastatin, Vicodin [hydrocodone-acetaminophen], Dutasteride, and Amoxicillin    Social History:  The patient  reports that he quit smoking about 27 years ago. His smoking use included cigarettes. He has a 11.25 pack-year smoking history. He has never used smokeless tobacco. He reports current alcohol use. He reports that he does not use drugs.   Family History:  The patient's family history includes Asthma in his sister; Heart attack in his father; Heart attack (age of onset: 65) in his daughter.    ROS:  Please see the history of present illness.   Otherwise, review of systems are positive for back pain.   All other systems are reviewed and negative.    PHYSICAL EXAM: VS:  BP 98/66    Pulse 72    Ht 5' 8.5" (1.74 m)    Wt 165 lb 12.8 oz (75.2 kg)    SpO2 97%    BMI 24.84 kg/m  , BMI Body mass index is 24.84 kg/m. GEN: Well nourished, well developed, in no acute distress  HEENT: normal  Neck: no JVD, carotid bruits, or masses Cardiac: RRR; no murmurs, rubs, or gallops,no edema  Respiratory:  clear to auscultation bilaterally, normal work of breathing GI: soft, nontender, nondistended, + BS MS: no deformity or atrophy  Skin: warm and dry, no rash Neuro:  Strength and sensation are intact Psych: euthymic mood, full affect    Recent Labs: 11/08/2019: Hemoglobin 11.4; Platelets 171 11/21/2019: BUN 50; Creatinine, Ser 1.90; Potassium 4.6; Sodium 138   Lipid Panel No results found for: CHOL, TRIG, HDL, CHOLHDL, VLDL,  LDLCALC, LDLDIRECT   Other studies Reviewed: Additional studies/ records that were reviewed today with results demonstrating: AAA u/s reviewed- increased size of AAA as noted.  LDL 46 in 2021 .   ASSESSMENT AND PLAN:  1. Tachy/brady: s/p PPM. Followed by EP. 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 AAA: 3.4 cm at last check, increased to 3.8 cm. 3. CKD: Cr 1.8. 4. Aortic sclerosis:  No CHF.   5. PVC: stable.  No sx.  6. Prediabetes: A1C 6.4 in 3/21.  Healthy diet.  Regular exercise.    Current medicines are reviewed at length with the patient today.  The patient  concerns regarding his medicines were addressed.  The following changes have been made:  No change  Labs/ tests ordered today include:  No orders of the defined types were placed in this encounter.   Recommend 150 minutes/week of aerobic exercise Low fat, low carb, high fiber diet recommended  Disposition:   FU in 1 year   Signed, Larae Grooms, MD  04/01/2020 3:29 PM    [facility] [place], [place], [place]  [address] Phone: [phone]; Fax: [phone]

## 2020-04-01 ENCOUNTER — Other Ambulatory Visit: Payer: Self-pay

## 2020-04-01 ENCOUNTER — Ambulatory Visit: Payer: Medicare HMO | Admitting: Interventional Cardiology

## 2020-04-01 ENCOUNTER — Encounter: Payer: Self-pay | Admitting: Interventional Cardiology

## 2020-04-01 VITALS — BP 98/66 | HR 72 | Ht 68.5 in | Wt 165.8 lb

## 2020-04-01 DIAGNOSIS — I10 Essential (primary) hypertension: Secondary | ICD-10-CM | POA: Diagnosis not present

## 2020-04-01 DIAGNOSIS — I493 Ventricular premature depolarization: Secondary | ICD-10-CM

## 2020-04-01 DIAGNOSIS — I495 Sick sinus syndrome: Secondary | ICD-10-CM

## 2020-04-01 DIAGNOSIS — N183 Chronic kidney disease, stage 3 unspecified: Secondary | ICD-10-CM

## 2020-04-01 DIAGNOSIS — Z950 Presence of cardiac pacemaker: Secondary | ICD-10-CM

## 2020-04-01 DIAGNOSIS — I714 Abdominal aortic aneurysm, without rupture, unspecified: Secondary | ICD-10-CM

## 2020-04-01 DIAGNOSIS — I358 Other nonrheumatic aortic valve disorders: Secondary | ICD-10-CM | POA: Diagnosis not present

## 2020-04-01 NOTE — Patient Instructions (Signed)
Medication Instructions:  Your physician recommends that you continue on your current medications as directed. Please refer to the Current Medication list given to you today.  *If you need a refill on your cardiac medications before your next appointment, please call your pharmacy*   Lab Work: None   If you have labs (blood work) drawn today and your tests are completely normal, you will receive your results only by: Marland Kitchen MyChart Message (if you have MyChart) OR . A paper copy in the mail If you have any lab test that is abnormal or we need to change your treatment, we will call you to review the results.   Testing/Procedures: Your physician has requested that you have an abdominal aorta duplex in August 2022 (previously ordered). During this test, an ultrasound is used to evaluate the aorta. Allow 30 minutes for this exam. Do not eat after midnight the day before and avoid carbonated beverages    Follow-Up: At [facility], you and your health needs are our priority.  As part of our continuing mission to provide you with exceptional heart care, we have created designated Provider Care Teams.  These Care Teams include your primary Cardiologist (physician) and Advanced Practice Providers (APPs -  Physician Assistants and Nurse Practitioners) who all work together to provide you with the care you need, when you need it.  We recommend signing up for the patient portal called "MyChart".  Sign up information is provided on this After Visit Summary.  MyChart is used to connect with patients for Virtual Visits (Telemedicine).  Patients are able to view lab/test results, encounter notes, upcoming appointments, etc.  Non-urgent messages can be sent to your provider as well.   To learn more about what you can do with MyChart, go to [email or web address].    Your next appointment:   12 month(s)  The format for your next appointment:   In Person  Provider:   You may see Larae Grooms, MD or  one of the following Advanced Practice Providers on your designated Care Team:    Melina Copa, PA-C  Ermalinda Barrios, PA-C    Other Instructions None

## 2020-04-15 DIAGNOSIS — I10 Essential (primary) hypertension: Secondary | ICD-10-CM | POA: Diagnosis not present

## 2020-04-15 DIAGNOSIS — M19041 Primary osteoarthritis, right hand: Secondary | ICD-10-CM | POA: Diagnosis not present

## 2020-04-15 DIAGNOSIS — E782 Mixed hyperlipidemia: Secondary | ICD-10-CM | POA: Diagnosis not present

## 2020-04-15 DIAGNOSIS — Z8546 Personal history of malignant neoplasm of prostate: Secondary | ICD-10-CM | POA: Diagnosis not present

## 2020-04-15 DIAGNOSIS — E1122 Type 2 diabetes mellitus with diabetic chronic kidney disease: Secondary | ICD-10-CM | POA: Diagnosis not present

## 2020-04-15 DIAGNOSIS — C61 Malignant neoplasm of prostate: Secondary | ICD-10-CM | POA: Diagnosis not present

## 2020-04-15 DIAGNOSIS — M19042 Primary osteoarthritis, left hand: Secondary | ICD-10-CM | POA: Diagnosis not present

## 2020-04-15 DIAGNOSIS — N183 Chronic kidney disease, stage 3 unspecified: Secondary | ICD-10-CM | POA: Diagnosis not present

## 2020-04-17 DIAGNOSIS — I10 Essential (primary) hypertension: Secondary | ICD-10-CM | POA: Diagnosis not present

## 2020-04-17 DIAGNOSIS — E782 Mixed hyperlipidemia: Secondary | ICD-10-CM | POA: Diagnosis not present

## 2020-04-17 DIAGNOSIS — Z8269 Family history of other diseases of the musculoskeletal system and connective tissue: Secondary | ICD-10-CM | POA: Diagnosis not present

## 2020-04-17 DIAGNOSIS — Z0000 Encounter for general adult medical examination without abnormal findings: Secondary | ICD-10-CM | POA: Diagnosis not present

## 2020-04-17 DIAGNOSIS — Z8739 Personal history of other diseases of the musculoskeletal system and connective tissue: Secondary | ICD-10-CM | POA: Diagnosis not present

## 2020-04-17 DIAGNOSIS — E790 Hyperuricemia without signs of inflammatory arthritis and tophaceous disease: Secondary | ICD-10-CM | POA: Diagnosis not present

## 2020-04-17 DIAGNOSIS — I714 Abdominal aortic aneurysm, without rupture: Secondary | ICD-10-CM | POA: Diagnosis not present

## 2020-04-17 DIAGNOSIS — Z8546 Personal history of malignant neoplasm of prostate: Secondary | ICD-10-CM | POA: Diagnosis not present

## 2020-04-17 DIAGNOSIS — E1122 Type 2 diabetes mellitus with diabetic chronic kidney disease: Secondary | ICD-10-CM | POA: Diagnosis not present

## 2020-04-17 DIAGNOSIS — Z85820 Personal history of malignant melanoma of skin: Secondary | ICD-10-CM | POA: Diagnosis not present

## 2020-04-27 ENCOUNTER — Telehealth: Payer: Self-pay | Admitting: Internal Medicine

## 2020-04-27 NOTE — Telephone Encounter (Signed)
Attempted to return patients phone call. No answer, LMOVM. 

## 2020-04-27 NOTE — Telephone Encounter (Signed)
Patient calling to speak with Victor Stark in regards to his pacemaker. He states that there is something different on it and he has a question about it.

## 2020-04-27 NOTE — Telephone Encounter (Signed)
Patient is returning call.  °

## 2020-04-27 NOTE — Telephone Encounter (Signed)
Pt reports tenderness in the muscles surrounding his PM.  There is no change in physical appearance.  Advised to treated like strained muscle and continue to monitor, notify if this doesn't improve with rest.

## 2020-05-12 ENCOUNTER — Ambulatory Visit (INDEPENDENT_AMBULATORY_CARE_PROVIDER_SITE_OTHER): Payer: Medicare HMO

## 2020-05-12 DIAGNOSIS — I495 Sick sinus syndrome: Secondary | ICD-10-CM

## 2020-05-12 LAB — CUP PACEART REMOTE DEVICE CHECK
Battery Remaining Longevity: 162 mo
Battery Voltage: 3.17 V
Brady Statistic AP VP Percent: 0.04 %
Brady Statistic AP VS Percent: 96.65 %
Brady Statistic AS VP Percent: 0 %
Brady Statistic AS VS Percent: 3.32 %
Brady Statistic RA Percent Paced: 96.73 %
Brady Statistic RV Percent Paced: 0.04 %
Date Time Interrogation Session: 20211122222820
Implantable Lead Implant Date: 20210524
Implantable Lead Implant Date: 20210524
Implantable Lead Location: 753859
Implantable Lead Location: 753860
Implantable Lead Model: 5076
Implantable Lead Model: 5076
Implantable Pulse Generator Implant Date: 20210524
Lead Channel Impedance Value: 361 Ohm
Lead Channel Impedance Value: 570 Ohm
Lead Channel Impedance Value: 646 Ohm
Lead Channel Impedance Value: 874 Ohm
Lead Channel Pacing Threshold Amplitude: 0.625 V
Lead Channel Pacing Threshold Amplitude: 0.625 V
Lead Channel Pacing Threshold Pulse Width: 0.4 ms
Lead Channel Pacing Threshold Pulse Width: 0.4 ms
Lead Channel Sensing Intrinsic Amplitude: 12 mV
Lead Channel Sensing Intrinsic Amplitude: 12 mV
Lead Channel Sensing Intrinsic Amplitude: 2.75 mV
Lead Channel Sensing Intrinsic Amplitude: 2.75 mV
Lead Channel Setting Pacing Amplitude: 1.5 V
Lead Channel Setting Pacing Amplitude: 2.5 V
Lead Channel Setting Pacing Pulse Width: 0.4 ms
Lead Channel Setting Sensing Sensitivity: 1.2 mV

## 2020-05-18 DIAGNOSIS — Z7689 Persons encountering health services in other specified circumstances: Secondary | ICD-10-CM | POA: Diagnosis not present

## 2020-05-18 DIAGNOSIS — M25571 Pain in right ankle and joints of right foot: Secondary | ICD-10-CM | POA: Diagnosis not present

## 2020-05-18 DIAGNOSIS — M79671 Pain in right foot: Secondary | ICD-10-CM | POA: Diagnosis not present

## 2020-05-19 NOTE — Progress Notes (Signed)
Remote pacemaker transmission.   

## 2020-06-16 DIAGNOSIS — M1A9XX0 Chronic gout, unspecified, without tophus (tophi): Secondary | ICD-10-CM | POA: Diagnosis not present

## 2020-06-30 DIAGNOSIS — C61 Malignant neoplasm of prostate: Secondary | ICD-10-CM | POA: Diagnosis not present

## 2020-06-30 DIAGNOSIS — M19042 Primary osteoarthritis, left hand: Secondary | ICD-10-CM | POA: Diagnosis not present

## 2020-06-30 DIAGNOSIS — E1122 Type 2 diabetes mellitus with diabetic chronic kidney disease: Secondary | ICD-10-CM | POA: Diagnosis not present

## 2020-06-30 DIAGNOSIS — E782 Mixed hyperlipidemia: Secondary | ICD-10-CM | POA: Diagnosis not present

## 2020-06-30 DIAGNOSIS — I10 Essential (primary) hypertension: Secondary | ICD-10-CM | POA: Diagnosis not present

## 2020-06-30 DIAGNOSIS — Z8546 Personal history of malignant neoplasm of prostate: Secondary | ICD-10-CM | POA: Diagnosis not present

## 2020-06-30 DIAGNOSIS — N183 Chronic kidney disease, stage 3 unspecified: Secondary | ICD-10-CM | POA: Diagnosis not present

## 2020-06-30 DIAGNOSIS — M19041 Primary osteoarthritis, right hand: Secondary | ICD-10-CM | POA: Diagnosis not present

## 2020-07-02 ENCOUNTER — Telehealth: Payer: Self-pay | Admitting: Interventional Cardiology

## 2020-07-02 NOTE — Telephone Encounter (Signed)
Patient calling because he got a heated vest and wanted to know if he could wear it if he has a pacemaker. He asked to speak with [place], Dr. Hassell Done nurse but will route to device clinic.

## 2020-07-02 NOTE — Telephone Encounter (Signed)
I spoke with the patient and let him know per the nurse note that there is no known risk to use a battery operated heated vest. The patient thanked me and verbalized understanding.

## 2020-07-02 NOTE — Telephone Encounter (Signed)
Called [employer] to speak to rep. For clarification. Per [employer] rep. Florinda Marker, there are no known risk to use a battery operated heated vest.   Attempted to call patient to advise, no answer. LMOVM.

## 2020-07-28 DIAGNOSIS — Z961 Presence of intraocular lens: Secondary | ICD-10-CM | POA: Diagnosis not present

## 2020-07-28 DIAGNOSIS — H43813 Vitreous degeneration, bilateral: Secondary | ICD-10-CM | POA: Diagnosis not present

## 2020-07-28 DIAGNOSIS — H5203 Hypermetropia, bilateral: Secondary | ICD-10-CM | POA: Diagnosis not present

## 2020-08-11 ENCOUNTER — Ambulatory Visit (INDEPENDENT_AMBULATORY_CARE_PROVIDER_SITE_OTHER): Payer: Medicare HMO

## 2020-08-11 DIAGNOSIS — I495 Sick sinus syndrome: Secondary | ICD-10-CM

## 2020-08-11 LAB — CUP PACEART REMOTE DEVICE CHECK
Battery Remaining Longevity: 154 mo
Battery Voltage: 3.12 V
Brady Statistic AP VP Percent: 0.04 %
Brady Statistic AP VS Percent: 97.6 %
Brady Statistic AS VP Percent: 0 %
Brady Statistic AS VS Percent: 2.35 %
Brady Statistic RA Percent Paced: 98.04 %
Brady Statistic RV Percent Paced: 0.04 %
Date Time Interrogation Session: 20220221221904
Implantable Lead Implant Date: 20210524
Implantable Lead Implant Date: 20210524
Implantable Lead Location: 753859
Implantable Lead Location: 753860
Implantable Lead Model: 5076
Implantable Lead Model: 5076
Implantable Pulse Generator Implant Date: 20210524
Lead Channel Impedance Value: 323 Ohm
Lead Channel Impedance Value: 418 Ohm
Lead Channel Impedance Value: 589 Ohm
Lead Channel Impedance Value: 817 Ohm
Lead Channel Pacing Threshold Amplitude: 0.5 V
Lead Channel Pacing Threshold Amplitude: 0.625 V
Lead Channel Pacing Threshold Pulse Width: 0.4 ms
Lead Channel Pacing Threshold Pulse Width: 0.4 ms
Lead Channel Sensing Intrinsic Amplitude: 2.25 mV
Lead Channel Sensing Intrinsic Amplitude: 2.25 mV
Lead Channel Sensing Intrinsic Amplitude: 7.5 mV
Lead Channel Sensing Intrinsic Amplitude: 7.5 mV
Lead Channel Setting Pacing Amplitude: 1.5 V
Lead Channel Setting Pacing Amplitude: 2.5 V
Lead Channel Setting Pacing Pulse Width: 0.4 ms
Lead Channel Setting Sensing Sensitivity: 1.2 mV

## 2020-08-16 DIAGNOSIS — S99921A Unspecified injury of right foot, initial encounter: Secondary | ICD-10-CM | POA: Diagnosis not present

## 2020-08-19 NOTE — Progress Notes (Signed)
Remote pacemaker transmission.   

## 2020-08-25 DIAGNOSIS — N1832 Chronic kidney disease, stage 3b: Secondary | ICD-10-CM | POA: Diagnosis not present

## 2020-08-31 DIAGNOSIS — N2581 Secondary hyperparathyroidism of renal origin: Secondary | ICD-10-CM | POA: Diagnosis not present

## 2020-08-31 DIAGNOSIS — N1832 Chronic kidney disease, stage 3b: Secondary | ICD-10-CM | POA: Diagnosis not present

## 2020-08-31 DIAGNOSIS — D631 Anemia in chronic kidney disease: Secondary | ICD-10-CM | POA: Diagnosis not present

## 2020-08-31 DIAGNOSIS — I129 Hypertensive chronic kidney disease with stage 1 through stage 4 chronic kidney disease, or unspecified chronic kidney disease: Secondary | ICD-10-CM | POA: Diagnosis not present

## 2020-08-31 IMAGING — CR DG CHEST 2V
2 series · 2 of 2 positions shown · non-contrast
Comparison: 03/22/2013

CLINICAL DATA: Post pacemaker

EXAM:
CHEST - 2 VIEW

[w chest pa]
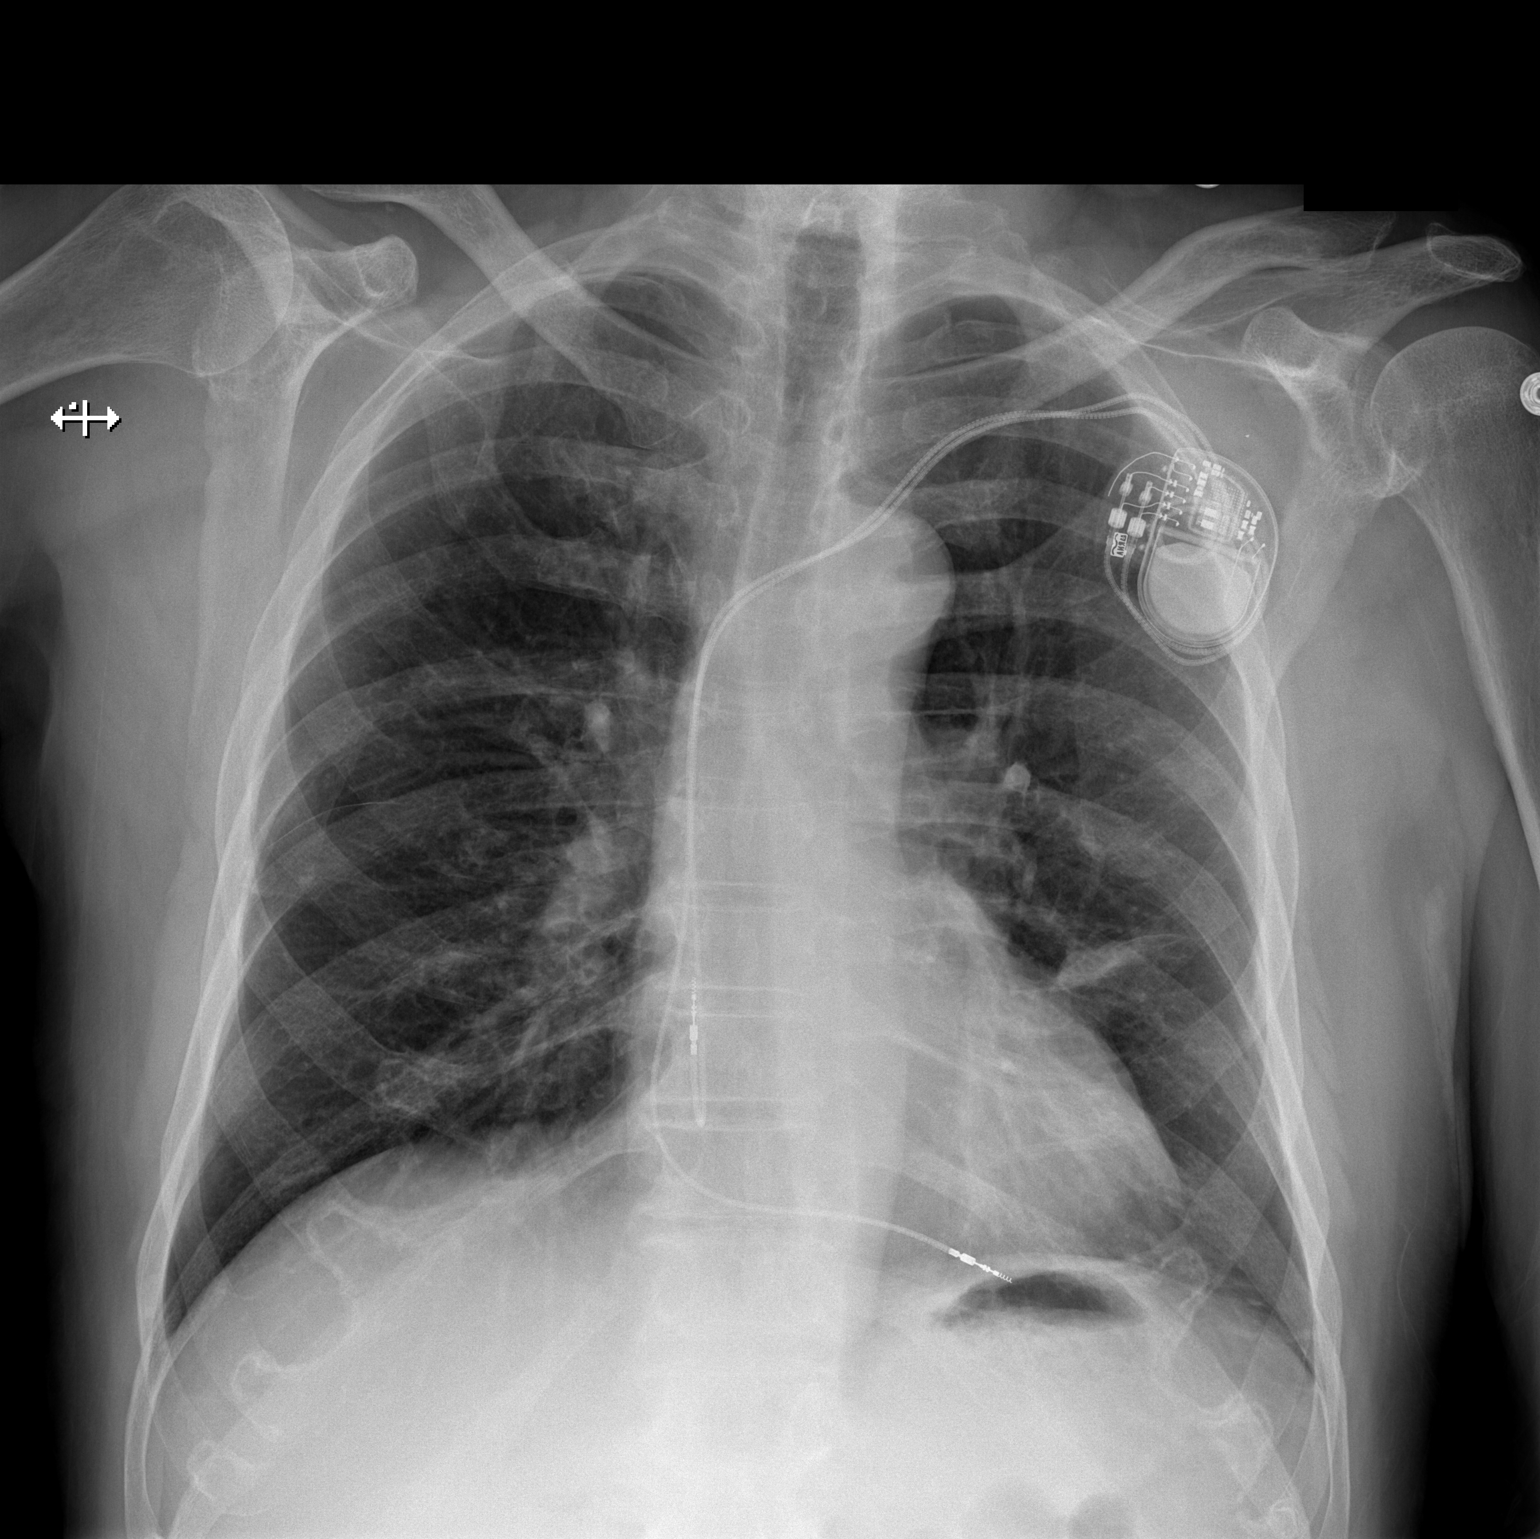

[w chest lat]
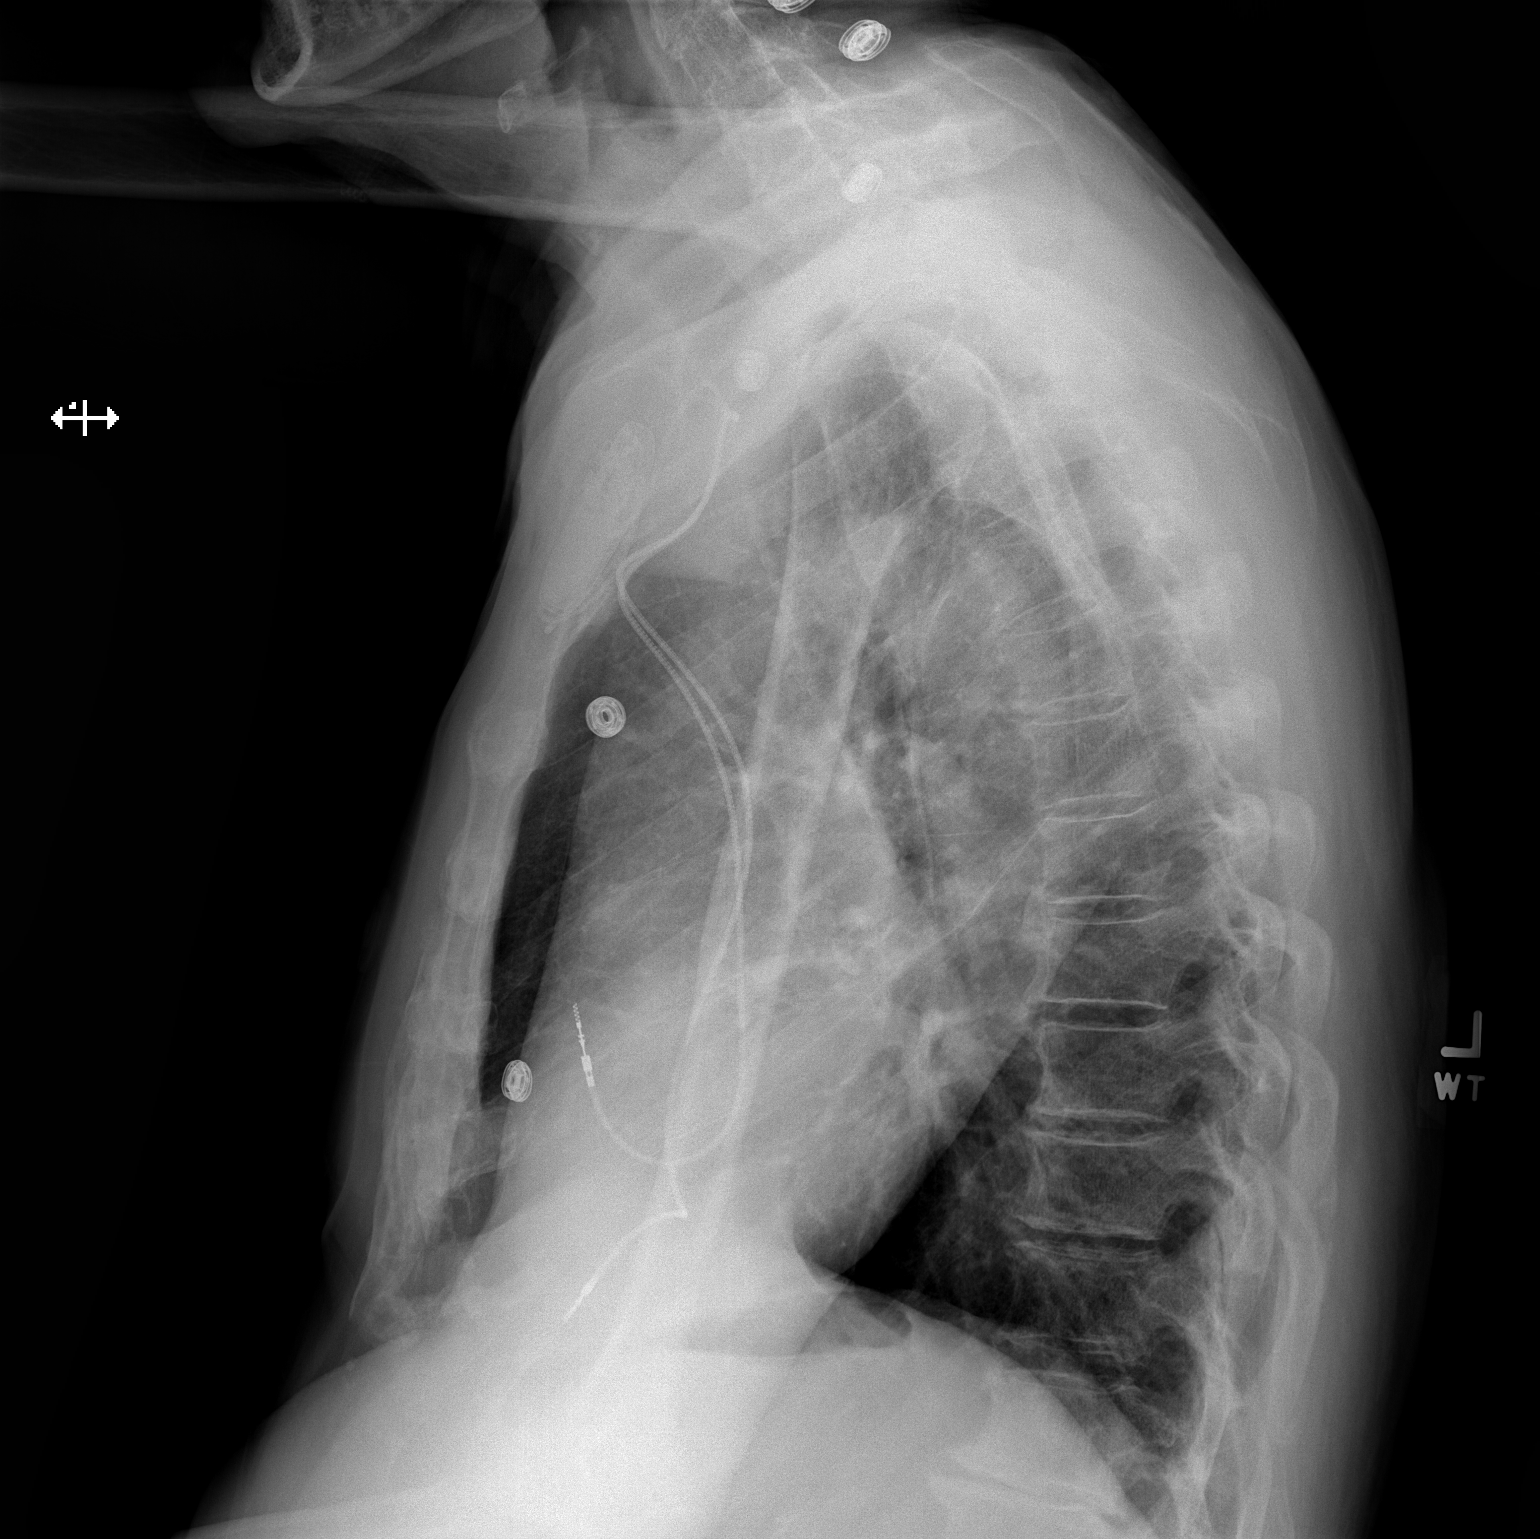

[2 of 2 positions shown; findings below may reference images not displayed]

FINDINGS: Left-sided pacemaker device with leads over the right atrium and
right ventricle. No pneumothorax. Normal heart size. Chronic ovoid
opacity in the left lower lung, likely scarring.
IMPRESSION: Left-sided pacing device with leads over right atrium and right
ventricle. Negative for pneumothorax. Stable probable scarring in
the left lower lung.

## 2020-09-08 DIAGNOSIS — E1122 Type 2 diabetes mellitus with diabetic chronic kidney disease: Secondary | ICD-10-CM | POA: Diagnosis not present

## 2020-09-08 DIAGNOSIS — Z8546 Personal history of malignant neoplasm of prostate: Secondary | ICD-10-CM | POA: Diagnosis not present

## 2020-09-08 DIAGNOSIS — M19042 Primary osteoarthritis, left hand: Secondary | ICD-10-CM | POA: Diagnosis not present

## 2020-09-08 DIAGNOSIS — M19041 Primary osteoarthritis, right hand: Secondary | ICD-10-CM | POA: Diagnosis not present

## 2020-09-08 DIAGNOSIS — I10 Essential (primary) hypertension: Secondary | ICD-10-CM | POA: Diagnosis not present

## 2020-09-08 DIAGNOSIS — E782 Mixed hyperlipidemia: Secondary | ICD-10-CM | POA: Diagnosis not present

## 2020-09-08 DIAGNOSIS — N183 Chronic kidney disease, stage 3 unspecified: Secondary | ICD-10-CM | POA: Diagnosis not present

## 2020-09-08 DIAGNOSIS — C61 Malignant neoplasm of prostate: Secondary | ICD-10-CM | POA: Diagnosis not present

## 2020-09-15 IMAGING — US US RENAL
1 series · 14 of 25 positions shown · non-contrast
Comparison: None.

CLINICAL DATA: Chronic kidney disease stage 3.

EXAM:
RENAL / URINARY TRACT ULTRASOUND COMPLETE

[Series 1: us renal · 0.23mm/px · 14 of 35 slices shown]
[im 1/35]
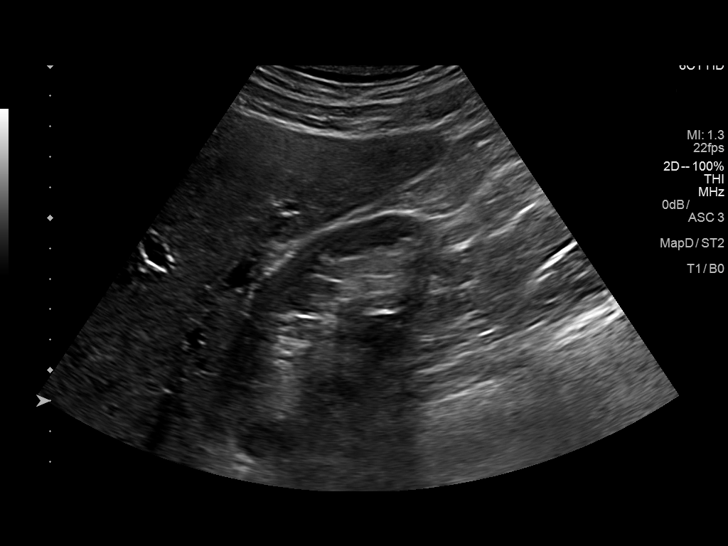
[im 3/35]
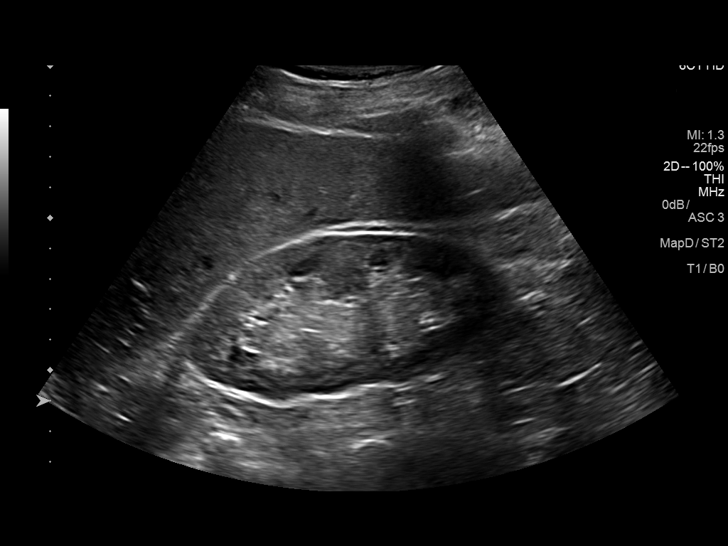
[im 6/35]
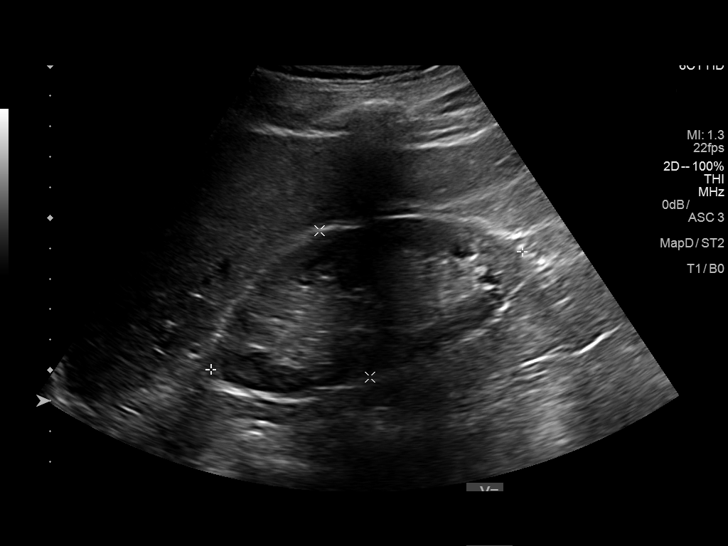
[im 9/35]
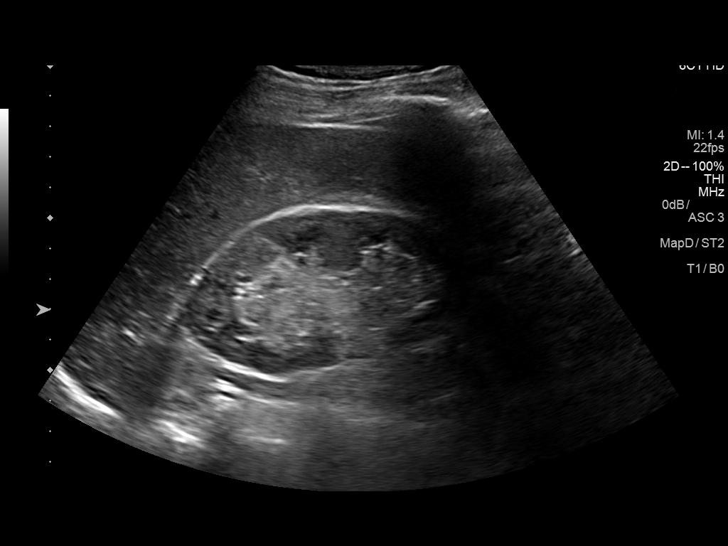
[im 12/35]
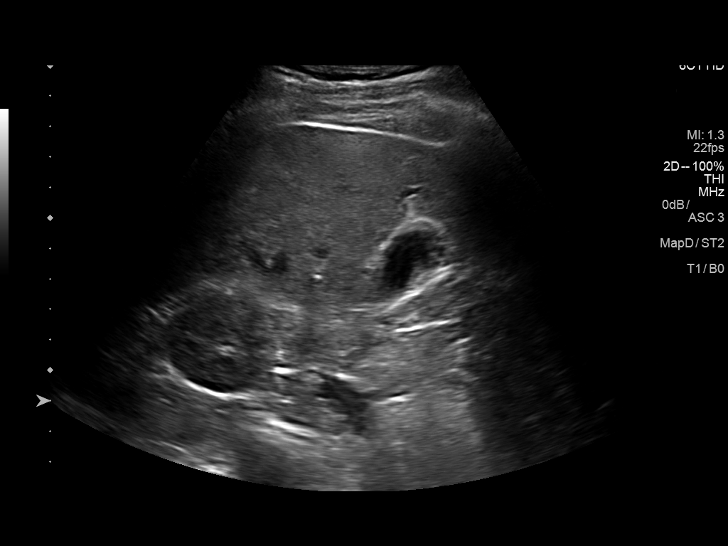
[im 13/35]
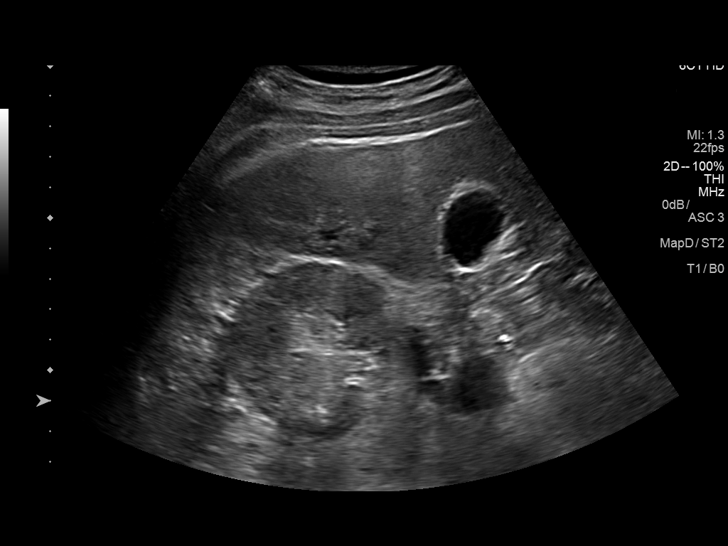
[im 16/35]
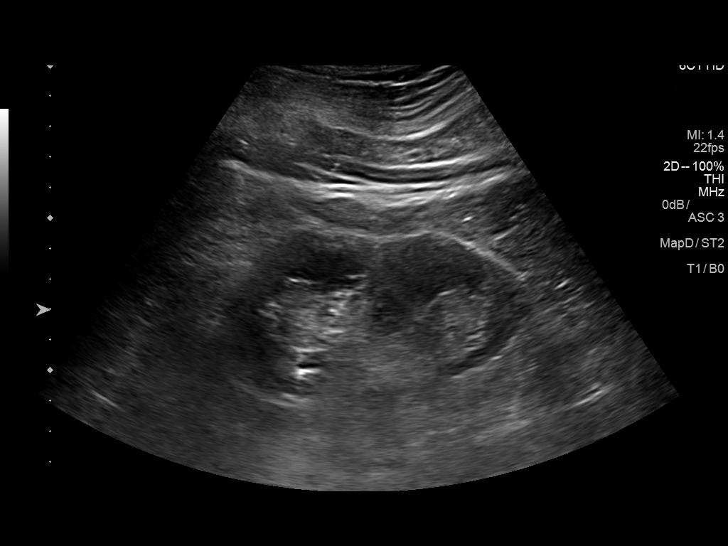
[im 19/35]
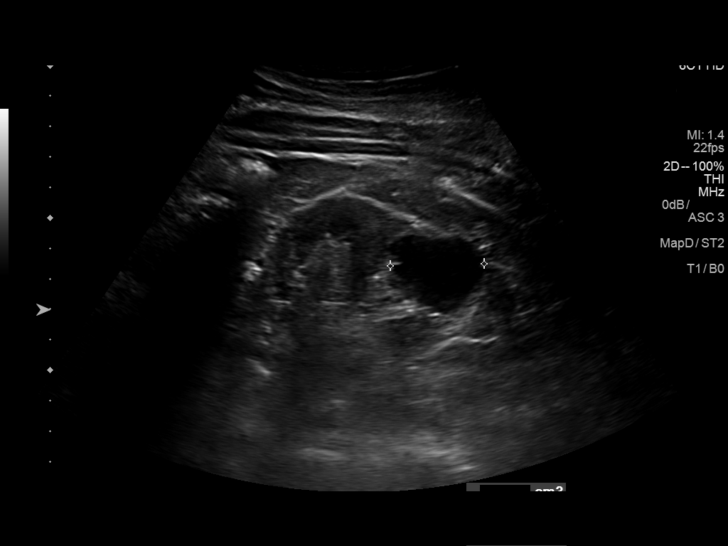
[im 22/35]
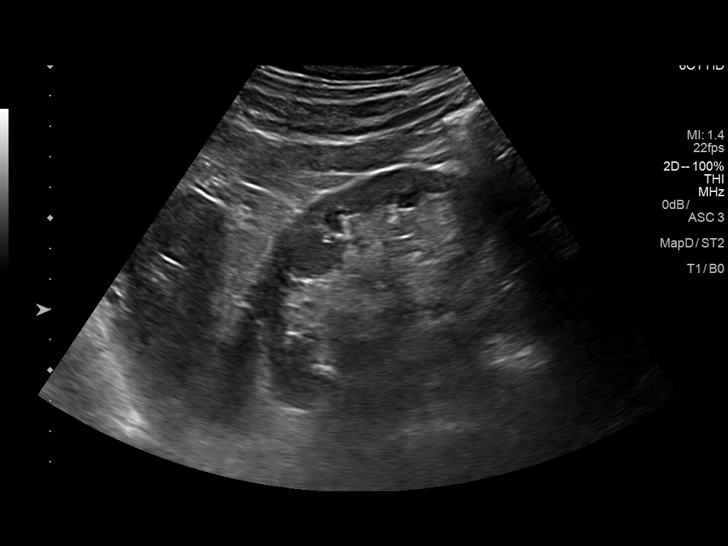
[im 23/35]
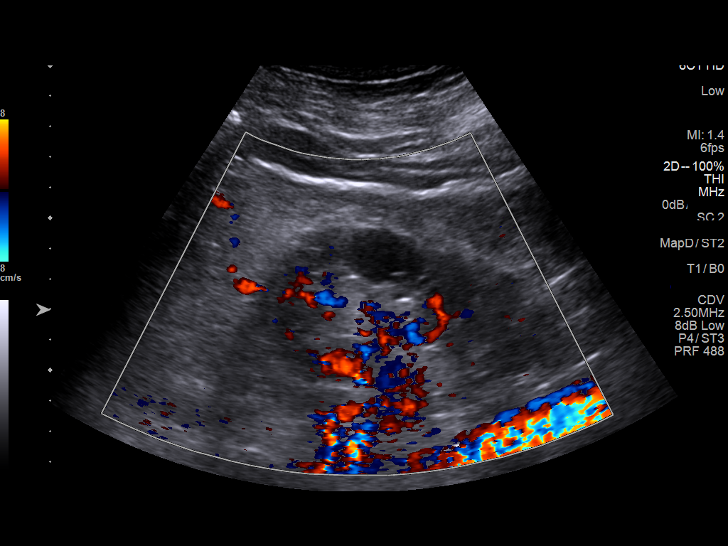
[im 26/35]
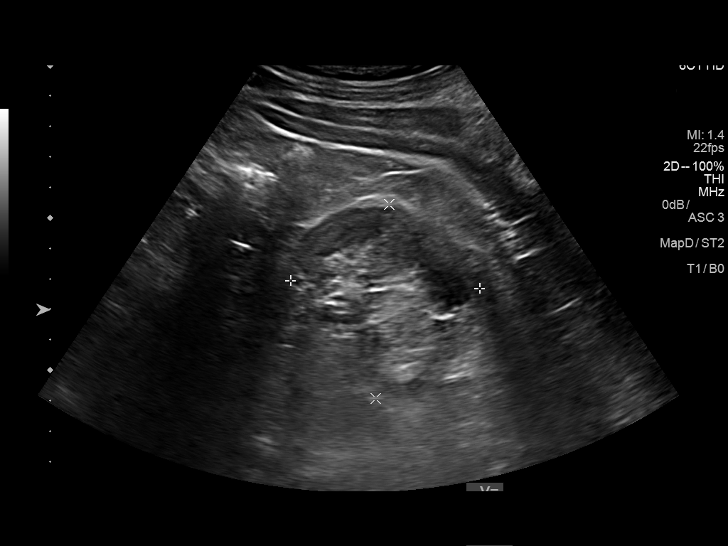
[im 29/35]
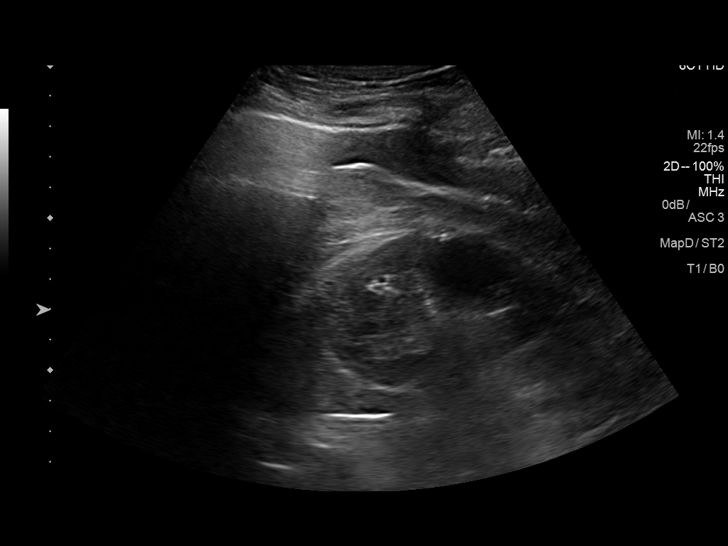
[im 32/35]
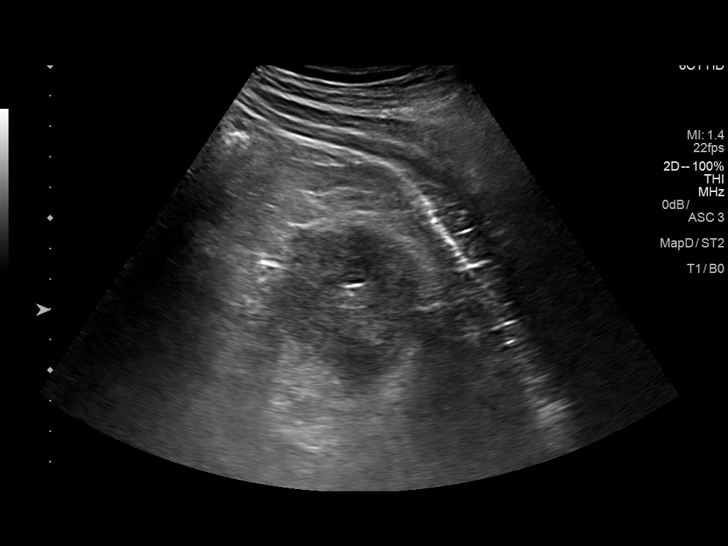
[im 35/35]
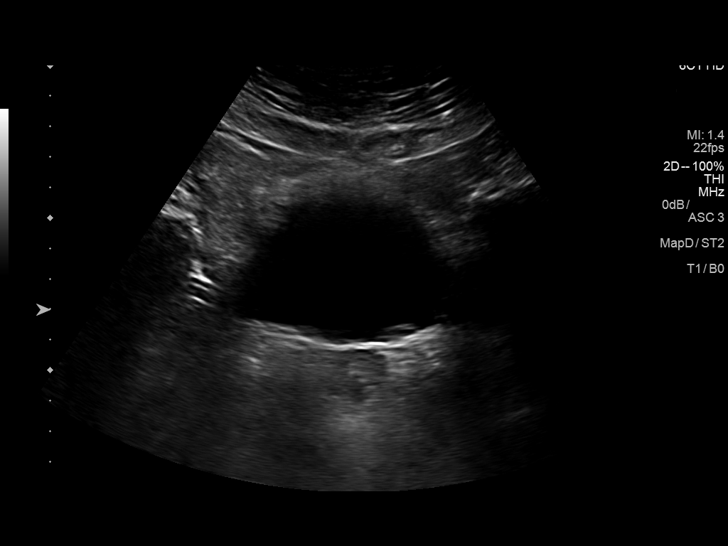

[14 of 25 positions shown; findings below may reference images not displayed]

FINDINGS: Right Kidney:

Renal measurements: 11.2 x 5.8 x 5.8 cm = volume: 198 mL .
Echogenicity within normal limits. No mass or hydronephrosis
visualized.

Left Kidney:

Renal measurements: 10.4 x 6.4 x 6.2 cm = volume: 217 ML. 3.6 cm
simple cyst is noted. Echogenicity within normal limits. No mass or
hydronephrosis visualized.

Bladder:

Appears normal for degree of bladder distention.
IMPRESSION: Simple left renal cyst.  No other renal abnormality is noted.

## 2020-09-28 DIAGNOSIS — Z0000 Encounter for general adult medical examination without abnormal findings: Secondary | ICD-10-CM | POA: Diagnosis not present

## 2020-09-28 DIAGNOSIS — Z1389 Encounter for screening for other disorder: Secondary | ICD-10-CM | POA: Diagnosis not present

## 2020-09-29 DIAGNOSIS — G479 Sleep disorder, unspecified: Secondary | ICD-10-CM | POA: Diagnosis not present

## 2020-09-29 DIAGNOSIS — M109 Gout, unspecified: Secondary | ICD-10-CM | POA: Diagnosis not present

## 2020-09-29 DIAGNOSIS — E782 Mixed hyperlipidemia: Secondary | ICD-10-CM | POA: Diagnosis not present

## 2020-09-29 DIAGNOSIS — I714 Abdominal aortic aneurysm, without rupture: Secondary | ICD-10-CM | POA: Diagnosis not present

## 2020-09-29 DIAGNOSIS — I495 Sick sinus syndrome: Secondary | ICD-10-CM | POA: Diagnosis not present

## 2020-09-29 DIAGNOSIS — Z85820 Personal history of malignant melanoma of skin: Secondary | ICD-10-CM | POA: Diagnosis not present

## 2020-09-29 DIAGNOSIS — N183 Chronic kidney disease, stage 3 unspecified: Secondary | ICD-10-CM | POA: Diagnosis not present

## 2020-09-29 DIAGNOSIS — Z8546 Personal history of malignant neoplasm of prostate: Secondary | ICD-10-CM | POA: Diagnosis not present

## 2020-09-29 DIAGNOSIS — Z23 Encounter for immunization: Secondary | ICD-10-CM | POA: Diagnosis not present

## 2020-09-29 DIAGNOSIS — E1122 Type 2 diabetes mellitus with diabetic chronic kidney disease: Secondary | ICD-10-CM | POA: Diagnosis not present

## 2020-10-02 DIAGNOSIS — M109 Gout, unspecified: Secondary | ICD-10-CM | POA: Diagnosis not present

## 2020-10-16 DIAGNOSIS — I714 Abdominal aortic aneurysm, without rupture: Secondary | ICD-10-CM | POA: Diagnosis not present

## 2020-10-16 DIAGNOSIS — E1122 Type 2 diabetes mellitus with diabetic chronic kidney disease: Secondary | ICD-10-CM | POA: Diagnosis not present

## 2020-10-16 DIAGNOSIS — I495 Sick sinus syndrome: Secondary | ICD-10-CM | POA: Diagnosis not present

## 2020-10-16 DIAGNOSIS — Z85820 Personal history of malignant melanoma of skin: Secondary | ICD-10-CM | POA: Diagnosis not present

## 2020-10-16 DIAGNOSIS — M109 Gout, unspecified: Secondary | ICD-10-CM | POA: Diagnosis not present

## 2020-10-16 DIAGNOSIS — Z23 Encounter for immunization: Secondary | ICD-10-CM | POA: Diagnosis not present

## 2020-10-16 DIAGNOSIS — Z8546 Personal history of malignant neoplasm of prostate: Secondary | ICD-10-CM | POA: Diagnosis not present

## 2020-10-16 DIAGNOSIS — E782 Mixed hyperlipidemia: Secondary | ICD-10-CM | POA: Diagnosis not present

## 2020-10-16 DIAGNOSIS — N183 Chronic kidney disease, stage 3 unspecified: Secondary | ICD-10-CM | POA: Diagnosis not present

## 2020-10-16 DIAGNOSIS — G479 Sleep disorder, unspecified: Secondary | ICD-10-CM | POA: Diagnosis not present

## 2020-11-05 DIAGNOSIS — R059 Cough, unspecified: Secondary | ICD-10-CM | POA: Diagnosis not present

## 2020-11-05 DIAGNOSIS — U071 COVID-19: Secondary | ICD-10-CM | POA: Diagnosis not present

## 2020-11-10 ENCOUNTER — Ambulatory Visit (INDEPENDENT_AMBULATORY_CARE_PROVIDER_SITE_OTHER): Payer: Medicare HMO

## 2020-11-10 DIAGNOSIS — M19042 Primary osteoarthritis, left hand: Secondary | ICD-10-CM | POA: Diagnosis not present

## 2020-11-10 DIAGNOSIS — I495 Sick sinus syndrome: Secondary | ICD-10-CM

## 2020-11-10 DIAGNOSIS — I10 Essential (primary) hypertension: Secondary | ICD-10-CM | POA: Diagnosis not present

## 2020-11-10 DIAGNOSIS — E782 Mixed hyperlipidemia: Secondary | ICD-10-CM | POA: Diagnosis not present

## 2020-11-10 DIAGNOSIS — N183 Chronic kidney disease, stage 3 unspecified: Secondary | ICD-10-CM | POA: Diagnosis not present

## 2020-11-10 DIAGNOSIS — E1122 Type 2 diabetes mellitus with diabetic chronic kidney disease: Secondary | ICD-10-CM | POA: Diagnosis not present

## 2020-11-10 DIAGNOSIS — M19041 Primary osteoarthritis, right hand: Secondary | ICD-10-CM | POA: Diagnosis not present

## 2020-11-10 LAB — CUP PACEART REMOTE DEVICE CHECK
Battery Remaining Longevity: 150 mo
Battery Voltage: 3.08 V
Brady Statistic AP VP Percent: 0.04 %
Brady Statistic AP VS Percent: 97.05 %
Brady Statistic AS VP Percent: 0 %
Brady Statistic AS VS Percent: 2.91 %
Brady Statistic RA Percent Paced: 97.28 %
Brady Statistic RV Percent Paced: 0.04 %
Date Time Interrogation Session: 20220523215040
Implantable Lead Implant Date: 20210524
Implantable Lead Implant Date: 20210524
Implantable Lead Location: 753859
Implantable Lead Location: 753860
Implantable Lead Model: 5076
Implantable Lead Model: 5076
Implantable Pulse Generator Implant Date: 20210524
Lead Channel Impedance Value: 342 Ohm
Lead Channel Impedance Value: 399 Ohm
Lead Channel Impedance Value: 684 Ohm
Lead Channel Impedance Value: 874 Ohm
Lead Channel Pacing Threshold Amplitude: 0.5 V
Lead Channel Pacing Threshold Amplitude: 0.75 V
Lead Channel Pacing Threshold Pulse Width: 0.4 ms
Lead Channel Pacing Threshold Pulse Width: 0.4 ms
Lead Channel Sensing Intrinsic Amplitude: 11.5 mV
Lead Channel Sensing Intrinsic Amplitude: 11.5 mV
Lead Channel Sensing Intrinsic Amplitude: 2.75 mV
Lead Channel Sensing Intrinsic Amplitude: 2.75 mV
Lead Channel Setting Pacing Amplitude: 1.5 V
Lead Channel Setting Pacing Amplitude: 2.5 V
Lead Channel Setting Pacing Pulse Width: 0.4 ms
Lead Channel Setting Sensing Sensitivity: 1.2 mV

## 2020-11-17 ENCOUNTER — Encounter: Payer: Medicare HMO | Admitting: Internal Medicine

## 2020-11-23 DIAGNOSIS — N183 Chronic kidney disease, stage 3 unspecified: Secondary | ICD-10-CM | POA: Diagnosis not present

## 2020-12-02 DIAGNOSIS — M19042 Primary osteoarthritis, left hand: Secondary | ICD-10-CM | POA: Diagnosis not present

## 2020-12-02 DIAGNOSIS — C61 Malignant neoplasm of prostate: Secondary | ICD-10-CM | POA: Diagnosis not present

## 2020-12-02 DIAGNOSIS — Z8546 Personal history of malignant neoplasm of prostate: Secondary | ICD-10-CM | POA: Diagnosis not present

## 2020-12-02 DIAGNOSIS — N183 Chronic kidney disease, stage 3 unspecified: Secondary | ICD-10-CM | POA: Diagnosis not present

## 2020-12-02 DIAGNOSIS — E782 Mixed hyperlipidemia: Secondary | ICD-10-CM | POA: Diagnosis not present

## 2020-12-02 DIAGNOSIS — M19041 Primary osteoarthritis, right hand: Secondary | ICD-10-CM | POA: Diagnosis not present

## 2020-12-02 DIAGNOSIS — E1122 Type 2 diabetes mellitus with diabetic chronic kidney disease: Secondary | ICD-10-CM | POA: Diagnosis not present

## 2020-12-02 DIAGNOSIS — I10 Essential (primary) hypertension: Secondary | ICD-10-CM | POA: Diagnosis not present

## 2020-12-02 NOTE — Progress Notes (Signed)
Remote pacemaker transmission.   

## 2020-12-08 DIAGNOSIS — Z1283 Encounter for screening for malignant neoplasm of skin: Secondary | ICD-10-CM | POA: Diagnosis not present

## 2020-12-08 DIAGNOSIS — D225 Melanocytic nevi of trunk: Secondary | ICD-10-CM | POA: Diagnosis not present

## 2021-01-25 DIAGNOSIS — Z950 Presence of cardiac pacemaker: Secondary | ICD-10-CM | POA: Insufficient documentation

## 2021-01-29 DIAGNOSIS — N183 Chronic kidney disease, stage 3 unspecified: Secondary | ICD-10-CM | POA: Diagnosis not present

## 2021-01-29 DIAGNOSIS — C61 Malignant neoplasm of prostate: Secondary | ICD-10-CM | POA: Diagnosis not present

## 2021-01-29 DIAGNOSIS — M19042 Primary osteoarthritis, left hand: Secondary | ICD-10-CM | POA: Diagnosis not present

## 2021-01-29 DIAGNOSIS — M19041 Primary osteoarthritis, right hand: Secondary | ICD-10-CM | POA: Diagnosis not present

## 2021-01-29 DIAGNOSIS — I10 Essential (primary) hypertension: Secondary | ICD-10-CM | POA: Diagnosis not present

## 2021-01-29 DIAGNOSIS — E782 Mixed hyperlipidemia: Secondary | ICD-10-CM | POA: Diagnosis not present

## 2021-01-29 DIAGNOSIS — Z8546 Personal history of malignant neoplasm of prostate: Secondary | ICD-10-CM | POA: Diagnosis not present

## 2021-01-29 DIAGNOSIS — E1122 Type 2 diabetes mellitus with diabetic chronic kidney disease: Secondary | ICD-10-CM | POA: Diagnosis not present

## 2021-02-02 ENCOUNTER — Other Ambulatory Visit: Payer: Self-pay

## 2021-02-02 ENCOUNTER — Ambulatory Visit: Payer: Medicare HMO | Admitting: Internal Medicine

## 2021-02-02 ENCOUNTER — Encounter: Payer: Self-pay | Admitting: Internal Medicine

## 2021-02-02 VITALS — BP 118/68 | HR 68 | Ht 69.0 in | Wt 169.4 lb

## 2021-02-02 DIAGNOSIS — Z950 Presence of cardiac pacemaker: Secondary | ICD-10-CM | POA: Diagnosis not present

## 2021-02-02 DIAGNOSIS — R001 Bradycardia, unspecified: Secondary | ICD-10-CM

## 2021-02-02 NOTE — Patient Instructions (Signed)

## 2021-02-02 NOTE — Progress Notes (Signed)
Patient Care Team: Rankins, Bill Salinas, MD as PCP - General (Family Medicine) Jettie Booze, MD as PCP - Cardiology (Cardiology)   HPI  Victor Stark is a 80 y.o. 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  There have been no palpitations, lightheadedness or syncope.        Date Cr K Hgb  6/21 1.9 4.6 11.4   4/22 1.74 4.6 11.3    Records and Results Reviewed   Past Medical History:  Diagnosis Date   AAA (abdominal aortic aneurysm) ([place]) 10/21/2013   3.1 in 1/14.    Actinic keratoses    Dr. Rozann Lesches   Aortic sclerosis 01/14/2016   Bradycardia 10/29/2014   Chronic kidney disease, stage III (moderate) ([place])    Dr. Welton Flakes   Cough 03/22/2013   Followed in Pulmonary clinic/ [facility]/ [facility] - try off acei 03/22/2013  > improved 04/19/2013     Diverticulitis 2012   Ganglion cyst of wrist    left- Dr. Daylene Katayama   Headache(784.0) 06/06/2013   HTN (hypertension) 04/19/2013   D/c ACEi 03/23/13 due to cough     Hx of cardiovascular stress test    LexiScan Myoview (9/15):  Normal study, EF 49% (visually looks normal; quantifies mildly decreased).  // Myoview 01/2019: EF 62, no ischemia or scar, low risk    Hyperlipidemia    Hypertension    Melanoma ([place]) 2013   Dr. Rozann Lesches   Prostate cancer [facility])    PVC's (premature ventricular contractions) 10/21/2013   Monitor in 2014 revealed normal sinus rhythm with PVCs    Solitary pulmonary nodule 03/23/2013   First detected 07/04/12 - See CT and f/u 09/12/12 improved     Spondylosis    Dr. Brien Few    Past Surgical History:  Procedure Laterality Date   LUMBAR [place] IMPLANT N/A 11/11/2019   Procedure: PACEMAKER IMPLANT;  Surgeon: Deboraha Sprang, MD;  Location: [place] [facility];   Service: Cardiovascular;  Laterality: N/A;   PROSTATECTOMY     ROTATOR CUFF REPAIR  Jan 2014   SHOULDER SURGERY      Current Meds  Medication Sig   acetaminophen (TYLENOL) 325 MG tablet Take 325-650 mg by mouth every 6 (six) hours as needed (pain.).    allopurinol (ZYLOPRIM) 100 MG tablet Take 1 tablet by mouth daily.   Cholecalciferol (VITAMIN D) 2000 UNITS CAPS Take 2,000 Units by mouth daily.    Coenzyme Q10 200 MG capsule Take 200 mg by mouth daily.   Multiple Vitamin (MULTIVITAMIN WITH MINERALS) TABS Take 1 tablet by mouth daily. Centrum Silver   Omega-3 Fatty Acids (OMEGA-3 EPA FISH OIL PO) Take 950 mg by mouth daily.   rosuvastatin (CRESTOR) 20 MG tablet Take 20 mg by mouth at bedtime.    telmisartan-hydrochlorothiazide (MICARDIS HCT) 80-25 MG tablet Take 0.5 tablets by mouth daily.   traZODone (DESYREL) 50 MG tablet Take 50 mg by mouth at bedtime.   valACYclovir (VALTREX) 500 MG tablet Take 500 mg by mouth as needed (BREAKOUTS).     Allergies  Allergen Reactions   Ace Inhibitors Cough   Etodolac Hives and Swelling   Hydrocodone-Acetaminophen Swelling and Hives   Lipitor [Atorvastatin Calcium] Other (See Comments)    Muscle pain/fatigue  Lisinopril Other (See Comments)    Dry cough    Pravastatin Other (See Comments)    Muscle aches    Vicodin [Hydrocodone-Acetaminophen] Hives and Swelling   Dutasteride Other (See Comments)    Lack of therapeutic effect    Amoxicillin Rash      Review of Systems negative except from HPI and PMH  Physical Exam BP 118/68   Pulse 68   Ht '5\' 9"'$  (1.753 m)   Wt 169 lb 6.4 oz (76.8 kg)   SpO2 96%   BMI 25.02 kg/m  Well developed and well nourished in no acute distress HENT normal Neck supple with JVP-flat Clear Device pocket well healed; without hematoma or erythema.  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  The patient does not  have concerns regarding medicines.

## 2021-02-05 ENCOUNTER — Other Ambulatory Visit (HOSPITAL_COMMUNITY): Payer: Self-pay | Admitting: Interventional Cardiology

## 2021-02-05 ENCOUNTER — Other Ambulatory Visit: Payer: Self-pay

## 2021-02-05 ENCOUNTER — Ambulatory Visit (HOSPITAL_COMMUNITY)
Admission: RE | Admit: 2021-02-05 | Discharge: 2021-02-05 | Disposition: A | Discharge status: Home / Self Care | Payer: Medicare HMO | Source: Ambulatory Visit | Attending: Cardiovascular Disease | Admitting: Cardiovascular Disease

## 2021-02-05 DIAGNOSIS — I714 Abdominal aortic aneurysm, without rupture, unspecified: Secondary | ICD-10-CM

## 2021-02-08 ENCOUNTER — Telehealth: Payer: Self-pay

## 2021-02-08 DIAGNOSIS — I714 Abdominal aortic aneurysm, without rupture, unspecified: Secondary | ICD-10-CM

## 2021-02-08 DIAGNOSIS — R2689 Other abnormalities of gait and mobility: Secondary | ICD-10-CM | POA: Diagnosis not present

## 2021-02-08 DIAGNOSIS — H8112 Benign paroxysmal vertigo, left ear: Secondary | ICD-10-CM | POA: Diagnosis not present

## 2021-02-08 NOTE — Telephone Encounter (Signed)
-----   Message from Jettie Booze, MD sent at 02/07/2021 11:23 PM EDT ----- AAA has increased in size to 4.6 cm in one view.  No intervention required but will repeat ultrasound study in 6 months.

## 2021-02-08 NOTE — Telephone Encounter (Signed)
Pts wife on DPR verbalized understanding of the Vasc US results ad agreed for repeat in 6 months.

## 2021-02-09 ENCOUNTER — Ambulatory Visit (INDEPENDENT_AMBULATORY_CARE_PROVIDER_SITE_OTHER): Payer: Medicare HMO

## 2021-02-09 DIAGNOSIS — I495 Sick sinus syndrome: Secondary | ICD-10-CM | POA: Diagnosis not present

## 2021-02-09 LAB — CUP PACEART REMOTE DEVICE CHECK
Battery Remaining Longevity: 151 mo
Battery Voltage: 3.05 V
Brady Statistic AP VP Percent: 0.05 %
Brady Statistic AP VS Percent: 97.06 %
Brady Statistic AS VP Percent: 0 %
Brady Statistic AS VS Percent: 2.89 %
Brady Statistic RA Percent Paced: 99.33 %
Brady Statistic RV Percent Paced: 0.05 %
Date Time Interrogation Session: 20220822212850
Implantable Lead Implant Date: 20210524
Implantable Lead Implant Date: 20210524
Implantable Lead Location: 753859
Implantable Lead Location: 753860
Implantable Lead Model: 5076
Implantable Lead Model: 5076
Implantable Pulse Generator Implant Date: 20210524
Lead Channel Impedance Value: 304 Ohm
Lead Channel Impedance Value: 513 Ohm
Lead Channel Impedance Value: 627 Ohm
Lead Channel Impedance Value: 836 Ohm
Lead Channel Pacing Threshold Amplitude: 0.625 V
Lead Channel Pacing Threshold Amplitude: 0.75 V
Lead Channel Pacing Threshold Pulse Width: 0.4 ms
Lead Channel Pacing Threshold Pulse Width: 0.4 ms
Lead Channel Sensing Intrinsic Amplitude: 1.875 mV
Lead Channel Sensing Intrinsic Amplitude: 1.875 mV
Lead Channel Sensing Intrinsic Amplitude: 9.125 mV
Lead Channel Sensing Intrinsic Amplitude: 9.125 mV
Lead Channel Setting Pacing Amplitude: 1.5 V
Lead Channel Setting Pacing Amplitude: 2.5 V
Lead Channel Setting Pacing Pulse Width: 0.4 ms
Lead Channel Setting Sensing Sensitivity: 1.2 mV

## 2021-02-23 DIAGNOSIS — I129 Hypertensive chronic kidney disease with stage 1 through stage 4 chronic kidney disease, or unspecified chronic kidney disease: Secondary | ICD-10-CM | POA: Diagnosis not present

## 2021-02-23 DIAGNOSIS — N1832 Chronic kidney disease, stage 3b: Secondary | ICD-10-CM | POA: Diagnosis not present

## 2021-02-23 DIAGNOSIS — C61 Malignant neoplasm of prostate: Secondary | ICD-10-CM | POA: Diagnosis not present

## 2021-02-23 DIAGNOSIS — H8110 Benign paroxysmal vertigo, unspecified ear: Secondary | ICD-10-CM | POA: Diagnosis not present

## 2021-02-23 DIAGNOSIS — N183 Chronic kidney disease, stage 3 unspecified: Secondary | ICD-10-CM | POA: Diagnosis not present

## 2021-02-23 DIAGNOSIS — D631 Anemia in chronic kidney disease: Secondary | ICD-10-CM | POA: Diagnosis not present

## 2021-02-24 NOTE — Progress Notes (Signed)
Remote pacemaker transmission.   

## 2021-02-25 DIAGNOSIS — N183 Chronic kidney disease, stage 3 unspecified: Secondary | ICD-10-CM | POA: Diagnosis not present

## 2021-02-25 DIAGNOSIS — M19042 Primary osteoarthritis, left hand: Secondary | ICD-10-CM | POA: Diagnosis not present

## 2021-02-25 DIAGNOSIS — I10 Essential (primary) hypertension: Secondary | ICD-10-CM | POA: Diagnosis not present

## 2021-02-25 DIAGNOSIS — Z8546 Personal history of malignant neoplasm of prostate: Secondary | ICD-10-CM | POA: Diagnosis not present

## 2021-02-25 DIAGNOSIS — E782 Mixed hyperlipidemia: Secondary | ICD-10-CM | POA: Diagnosis not present

## 2021-02-25 DIAGNOSIS — E1122 Type 2 diabetes mellitus with diabetic chronic kidney disease: Secondary | ICD-10-CM | POA: Diagnosis not present

## 2021-02-25 DIAGNOSIS — M19041 Primary osteoarthritis, right hand: Secondary | ICD-10-CM | POA: Diagnosis not present

## 2021-03-08 DIAGNOSIS — C44329 Squamous cell carcinoma of skin of other parts of face: Secondary | ICD-10-CM | POA: Diagnosis not present

## 2021-03-09 DIAGNOSIS — R42 Dizziness and giddiness: Secondary | ICD-10-CM | POA: Diagnosis not present

## 2021-03-09 DIAGNOSIS — H903 Sensorineural hearing loss, bilateral: Secondary | ICD-10-CM | POA: Diagnosis not present

## 2021-03-09 DIAGNOSIS — H9193 Unspecified hearing loss, bilateral: Secondary | ICD-10-CM | POA: Diagnosis not present

## 2021-03-09 DIAGNOSIS — R519 Headache, unspecified: Secondary | ICD-10-CM | POA: Diagnosis not present

## 2021-03-24 DIAGNOSIS — H43812 Vitreous degeneration, left eye: Secondary | ICD-10-CM | POA: Diagnosis not present

## 2021-03-31 DIAGNOSIS — H43812 Vitreous degeneration, left eye: Secondary | ICD-10-CM | POA: Diagnosis not present

## 2021-03-31 DIAGNOSIS — H35372 Puckering of macula, left eye: Secondary | ICD-10-CM | POA: Diagnosis not present

## 2021-04-05 DIAGNOSIS — Z85828 Personal history of other malignant neoplasm of skin: Secondary | ICD-10-CM | POA: Diagnosis not present

## 2021-04-05 DIAGNOSIS — Z08 Encounter for follow-up examination after completed treatment for malignant neoplasm: Secondary | ICD-10-CM | POA: Diagnosis not present

## 2021-04-09 DIAGNOSIS — Z01818 Encounter for other preprocedural examination: Secondary | ICD-10-CM | POA: Diagnosis not present

## 2021-04-09 DIAGNOSIS — Z950 Presence of cardiac pacemaker: Secondary | ICD-10-CM | POA: Diagnosis not present

## 2021-04-09 DIAGNOSIS — R42 Dizziness and giddiness: Secondary | ICD-10-CM | POA: Diagnosis not present

## 2021-04-09 DIAGNOSIS — R519 Headache, unspecified: Secondary | ICD-10-CM | POA: Diagnosis not present

## 2021-04-13 DIAGNOSIS — E1122 Type 2 diabetes mellitus with diabetic chronic kidney disease: Secondary | ICD-10-CM | POA: Diagnosis not present

## 2021-04-13 DIAGNOSIS — M109 Gout, unspecified: Secondary | ICD-10-CM | POA: Diagnosis not present

## 2021-04-13 DIAGNOSIS — E782 Mixed hyperlipidemia: Secondary | ICD-10-CM | POA: Diagnosis not present

## 2021-04-13 DIAGNOSIS — I495 Sick sinus syndrome: Secondary | ICD-10-CM | POA: Diagnosis not present

## 2021-04-13 DIAGNOSIS — I10 Essential (primary) hypertension: Secondary | ICD-10-CM | POA: Diagnosis not present

## 2021-04-13 DIAGNOSIS — N183 Chronic kidney disease, stage 3 unspecified: Secondary | ICD-10-CM | POA: Diagnosis not present

## 2021-04-23 DIAGNOSIS — M47812 Spondylosis without myelopathy or radiculopathy, cervical region: Secondary | ICD-10-CM | POA: Diagnosis not present

## 2021-04-23 DIAGNOSIS — M4802 Spinal stenosis, cervical region: Secondary | ICD-10-CM | POA: Diagnosis not present

## 2021-04-28 DIAGNOSIS — M542 Cervicalgia: Secondary | ICD-10-CM | POA: Diagnosis not present

## 2021-04-28 DIAGNOSIS — M4802 Spinal stenosis, cervical region: Secondary | ICD-10-CM | POA: Diagnosis not present

## 2021-05-05 DIAGNOSIS — G43809 Other migraine, not intractable, without status migrainosus: Secondary | ICD-10-CM | POA: Diagnosis not present

## 2021-05-05 DIAGNOSIS — R42 Dizziness and giddiness: Secondary | ICD-10-CM | POA: Diagnosis not present

## 2021-05-05 DIAGNOSIS — H9193 Unspecified hearing loss, bilateral: Secondary | ICD-10-CM | POA: Diagnosis not present

## 2021-05-11 ENCOUNTER — Ambulatory Visit (INDEPENDENT_AMBULATORY_CARE_PROVIDER_SITE_OTHER): Payer: Medicare HMO

## 2021-05-11 DIAGNOSIS — I495 Sick sinus syndrome: Secondary | ICD-10-CM | POA: Diagnosis not present

## 2021-05-11 LAB — CUP PACEART REMOTE DEVICE CHECK
Battery Remaining Longevity: 145 mo
Battery Voltage: 3.04 V
Brady Statistic AP VP Percent: 0.03 %
Brady Statistic AP VS Percent: 98.95 %
Brady Statistic AS VP Percent: 0 %
Brady Statistic AS VS Percent: 1.02 %
Brady Statistic RA Percent Paced: 99.4 %
Brady Statistic RV Percent Paced: 0.03 %
Date Time Interrogation Session: 20221121204552
Implantable Lead Implant Date: 20210524
Implantable Lead Implant Date: 20210524
Implantable Lead Location: 753859
Implantable Lead Location: 753860
Implantable Lead Model: 5076
Implantable Lead Model: 5076
Implantable Pulse Generator Implant Date: 20210524
Lead Channel Impedance Value: 304 Ohm
Lead Channel Impedance Value: 437 Ohm
Lead Channel Impedance Value: 608 Ohm
Lead Channel Impedance Value: 855 Ohm
Lead Channel Pacing Threshold Amplitude: 0.625 V
Lead Channel Pacing Threshold Amplitude: 0.625 V
Lead Channel Pacing Threshold Pulse Width: 0.4 ms
Lead Channel Pacing Threshold Pulse Width: 0.4 ms
Lead Channel Sensing Intrinsic Amplitude: 4.25 mV
Lead Channel Sensing Intrinsic Amplitude: 4.25 mV
Lead Channel Sensing Intrinsic Amplitude: 6.625 mV
Lead Channel Sensing Intrinsic Amplitude: 6.625 mV
Lead Channel Setting Pacing Amplitude: 1.5 V
Lead Channel Setting Pacing Amplitude: 2.5 V
Lead Channel Setting Pacing Pulse Width: 0.4 ms
Lead Channel Setting Sensing Sensitivity: 1.2 mV

## 2021-05-17 DIAGNOSIS — S70361A Insect bite (nonvenomous), right thigh, initial encounter: Secondary | ICD-10-CM | POA: Diagnosis not present

## 2021-05-17 DIAGNOSIS — L820 Inflamed seborrheic keratosis: Secondary | ICD-10-CM | POA: Diagnosis not present

## 2021-05-20 NOTE — Progress Notes (Signed)
Remote pacemaker transmission.   

## 2021-05-25 DIAGNOSIS — H9190 Unspecified hearing loss, unspecified ear: Secondary | ICD-10-CM | POA: Diagnosis not present

## 2021-05-25 DIAGNOSIS — M26622 Arthralgia of left temporomandibular joint: Secondary | ICD-10-CM | POA: Diagnosis not present

## 2021-06-30 DIAGNOSIS — E1122 Type 2 diabetes mellitus with diabetic chronic kidney disease: Secondary | ICD-10-CM | POA: Diagnosis not present

## 2021-06-30 DIAGNOSIS — I10 Essential (primary) hypertension: Secondary | ICD-10-CM | POA: Diagnosis not present

## 2021-06-30 DIAGNOSIS — E782 Mixed hyperlipidemia: Secondary | ICD-10-CM | POA: Diagnosis not present

## 2021-06-30 DIAGNOSIS — M19042 Primary osteoarthritis, left hand: Secondary | ICD-10-CM | POA: Diagnosis not present

## 2021-06-30 DIAGNOSIS — N183 Chronic kidney disease, stage 3 unspecified: Secondary | ICD-10-CM | POA: Diagnosis not present

## 2021-06-30 DIAGNOSIS — M19041 Primary osteoarthritis, right hand: Secondary | ICD-10-CM | POA: Diagnosis not present

## 2021-06-30 DIAGNOSIS — Z8546 Personal history of malignant neoplasm of prostate: Secondary | ICD-10-CM | POA: Diagnosis not present

## 2021-06-30 DIAGNOSIS — C61 Malignant neoplasm of prostate: Secondary | ICD-10-CM | POA: Diagnosis not present

## 2021-07-06 NOTE — Progress Notes (Signed)
Cardiology Office Note   Date:  07/07/2021   ID:  Victor Stark, DOB Jan 28, 1941, MRN [id number]  PCP:  Aretta Nip, MD    Chief Complaint  Patient presents with   Follow-up    Aorta testing if needed     Wt Readings from Last 3 Encounters:  07/07/21 167 lb (75.8 kg)  02/02/21 169 lb 6.4 oz (76.8 kg)  04/01/20 165 lb 12.8 oz (75.2 kg)       History of Present Illness: Victor Stark is a 81 y.o. male   with a history of HTN, HL, prostate CA, CKD, AAA.  Saw PCP in 10/2013 for dizziness.  Referred back for bradycardia.  BP medications were adjusted some to see if this would help.  He changed to Valsartan 160 QD, then Valsartan/HCTZ 160/12.5 mg QOD alt with Valsartan 160 QD.  BP tended to run higher.  Then went to  Valsartan/HCTZ 160/12.5 QD.  BP optimal.  No further dizziness.     He has a hx of vertigo.  He says his symptoms were similar.  He did call our office and was placed on a 24 Hr Holter.  This demonstrated NSR, PVCs, one triplet.  No significant arrhythmias.     He was also evaluated with an exercise treadmill test in late 2015. He did not achieve target heart rate. He then underwent a nuclear stress test showing no evidence of ischemia.   Stress test in 01/2019: "Normal resting and stress perfusion. No ischemia or infarction EF 62%"   Switched to telmisartan/HCTZ due to shortage.    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 [name].  Noticed improved stamina after pacer was placed.     Past Medical History:  Diagnosis Date   AAA (abdominal aortic aneurysm) 10/21/2013   3.1 in 1/14.    Actinic keratoses    Dr. Rozann Lesches   Aortic sclerosis 01/14/2016   Bradycardia 10/29/2014   Chronic kidney disease, stage III (moderate) ([place])    Dr. Welton Flakes   Cough 03/22/2013   Followed in Pulmonary clinic/ [facility]/ [facility] - try off acei 03/22/2013  > improved 04/19/2013     Diverticulitis 2012   Ganglion cyst  of wrist    left- Dr. Daylene Katayama   Headache(784.0) 06/06/2013   HTN (hypertension) 04/19/2013   D/c ACEi 03/23/13 due to cough     Hx of cardiovascular stress test    LexiScan Myoview (9/15):  Normal study, EF 49% (visually looks normal; quantifies mildly decreased).  // Myoview 01/2019: EF 62, no ischemia or scar, low risk    Hyperlipidemia    Hypertension    Melanoma ([place]) 2013   Dr. Rozann Lesches   Prostate cancer [facility])    PVC's (premature ventricular contractions) 10/21/2013   Monitor in 2014 revealed normal sinus rhythm with PVCs    Solitary pulmonary nodule 03/23/2013   First detected 07/04/12 - See CT and f/u 09/12/12 improved     Spondylosis    Dr. Brien Few    Past Surgical History:  Procedure Laterality Date   LUMBAR [place] IMPLANT N/A 11/11/2019   Procedure: PACEMAKER IMPLANT;  Surgeon: Deboraha Sprang, MD;  Location: [place] [facility];  Service: Cardiovascular;  Laterality: N/A;   PROSTATECTOMY     ROTATOR CUFF REPAIR  Jan 2014   SHOULDER SURGERY       Current Outpatient Medications  Medication Sig Dispense Refill  acetaminophen (TYLENOL) 325 MG tablet Take 325-650 mg by mouth every 6 (six) hours as needed (pain.).      allopurinol (ZYLOPRIM) 100 MG tablet Take 1 tablet by mouth daily.     Cholecalciferol (VITAMIN D) 2000 UNITS CAPS Take 2,000 Units by mouth daily.      Coenzyme Q10 200 MG capsule Take 200 mg by mouth daily.     meclizine (ANTIVERT) 25 MG tablet Take 25 mg by mouth 3 (three) times daily as needed for dizziness.     Multiple Vitamin (MULTIVITAMIN WITH MINERALS) TABS Take 1 tablet by mouth daily. Centrum Silver     Omega-3 Fatty Acids (OMEGA-3 EPA FISH OIL PO) Take 950 mg by mouth daily.     rosuvastatin (CRESTOR) 20 MG tablet Take 20 mg by mouth at bedtime.      telmisartan-hydrochlorothiazide (MICARDIS HCT) 80-25 MG tablet Take 0.5 tablets by mouth daily. 45 tablet 3   traZODone (DESYREL) 50 MG tablet Take 50 mg by mouth at bedtime.      valACYclovir (VALTREX) 500 MG tablet Take 500 mg by mouth as needed (BREAKOUTS).      No current facility-administered medications for this visit.    Allergies:   Ace inhibitors, Etodolac, Hydrocodone-acetaminophen, Lipitor [atorvastatin calcium], Lisinopril, Pravastatin, Vicodin [hydrocodone-acetaminophen], Dutasteride, Nsaids, and Amoxicillin    Social History:  The patient  reports that he quit smoking about 29 years ago. His smoking use included cigarettes. He has a 11.25 pack-year smoking history. He has never used smokeless tobacco. He reports current alcohol use. He reports that he does not use drugs.   Family History:  The patient's family history includes Asthma in his sister; Heart attack in his father; Heart attack (age of onset: 104) in his daughter.    ROS:  Please see the history of present illness.   Otherwise, review of systems are positive for not enjoying his healthy diet.   All other systems are reviewed and negative.    PHYSICAL EXAM: VS:  BP 122/64    Pulse 85    Ht 5\' 9"  (1.753 m)    Wt 167 lb (75.8 kg)    SpO2 98%    BMI 24.66 kg/m  , BMI Body mass index is 24.66 kg/m. GEN: Well nourished, well developed, in no acute distress HEENT: normal Neck: no JVD, carotid bruits, or masses Cardiac: RRR; 1/6 early systolic murmur, no rubs, or gallops,no edema  Respiratory:  clear to auscultation bilaterally, normal work of breathing GI: soft, nontender, nondistended, + BS, no discrete aneurysm can be palpated MS: no deformity or atrophy Skin: warm and dry, no rash Neuro:  Strength and sensation are intact Psych: euthymic mood, full affect    Recent Labs: No results found for requested labs within last 8760 hours.   Lipid Panel No results found for: CHOL, TRIG, HDL, CHOLHDL, VLDL, LDLCALC, LDLDIRECT   Other studies Reviewed: Additional studies/ records that were reviewed today with results demonstrating: Abdominal ultrasound reviewed, labs reviewed.   ASSESSMENT  AND PLAN:  Tachy/brady: s/p PPM.  Medtronic PM placed in 5/21.  Doing well since pacemaker placement AAA: 3.8 cm in 2021.  4.6 in 2022.  We will recheck ultrasound in February which will be 6 months.  Aortic atherosclerosis.  Continue statin.  We will also refer to vascular surgery for further evaluation. CKD 3B: Avoid NSAIDs.  Stay well-hydrated. Aortic sclerosis: No symptoms. PVC: No symptoms. PreDM: High fiber, whole food, plant diet.  6.5 A1C.  LDL 69 on rosuvastatin  for aggressive risk factor modification as well. HTN: The current medical regimen is effective;  continue present plan and medications.    Current medicines are reviewed at length with the patient today.  The patient concerns regarding his medicines were addressed.  The following changes have been made:  No change  Labs/ tests ordered today include: AAA u/s No orders of the defined types were placed in this encounter.   Recommend 150 minutes/week of aerobic exercise Low fat, low carb, high fiber diet recommended  Disposition:   FU in 1 year with me   Signed, Larae Grooms, MD  07/07/2021 12:10 PM    [facility] [place], [place], [place]  [address] Phone: [phone]; Fax: [phone]

## 2021-07-07 ENCOUNTER — Ambulatory Visit: Payer: Medicare HMO | Admitting: Interventional Cardiology

## 2021-07-07 ENCOUNTER — Encounter: Payer: Self-pay | Admitting: Interventional Cardiology

## 2021-07-07 ENCOUNTER — Other Ambulatory Visit: Payer: Self-pay

## 2021-07-07 VITALS — BP 122/64 | HR 85 | Ht 69.0 in | Wt 167.0 lb

## 2021-07-07 DIAGNOSIS — I495 Sick sinus syndrome: Secondary | ICD-10-CM | POA: Diagnosis not present

## 2021-07-07 DIAGNOSIS — I10 Essential (primary) hypertension: Secondary | ICD-10-CM

## 2021-07-07 DIAGNOSIS — I7143 Infrarenal abdominal aortic aneurysm, without rupture: Secondary | ICD-10-CM | POA: Diagnosis not present

## 2021-07-07 DIAGNOSIS — N1832 Chronic kidney disease, stage 3b: Secondary | ICD-10-CM

## 2021-07-07 DIAGNOSIS — H2000 Unspecified acute and subacute iridocyclitis: Secondary | ICD-10-CM | POA: Diagnosis not present

## 2021-07-07 DIAGNOSIS — I493 Ventricular premature depolarization: Secondary | ICD-10-CM

## 2021-07-07 DIAGNOSIS — I358 Other nonrheumatic aortic valve disorders: Secondary | ICD-10-CM

## 2021-07-07 DIAGNOSIS — Z950 Presence of cardiac pacemaker: Secondary | ICD-10-CM

## 2021-07-07 DIAGNOSIS — I700 Atherosclerosis of aorta: Secondary | ICD-10-CM | POA: Diagnosis not present

## 2021-07-07 NOTE — Patient Instructions (Addendum)
Medication Instructions:  Your physician recommends that you continue on your current medications as directed. Please refer to the Current Medication list given to you today.   *If you need a refill on your cardiac medications before your next appointment, please call your pharmacy*   Lab Work: none If you have labs (blood work) drawn today and your tests are completely normal, you will receive your results only by: [place] (if you have MyChart) OR A paper copy in the mail If you have any lab test that is abnormal or we need to change your treatment, we will call you to review the results.   Testing/Procedures: Your physician has requested that you have an abdominal aorta duplex. During this test, an ultrasound is used to evaluate the aorta. Allow 30 minutes for this exam. Do not eat after midnight the day before and avoid carbonated beverages.  To be done in February 2023   Follow-Up: At [facility], you and your health needs are our priority.  As part of our continuing mission to provide you with exceptional heart care, we have created designated Provider Care Teams.  These Care Teams include your primary Cardiologist (physician) and Advanced Practice Providers (APPs -  Physician Assistants and Nurse Practitioners) who all work together to provide you with the care you need, when you need it.  We recommend signing up for the patient portal called "MyChart".  Sign up information is provided on this After Visit Summary.  MyChart is used to connect with patients for Virtual Visits (Telemedicine).  Patients are able to view lab/test results, encounter notes, upcoming appointments, etc.  Non-urgent messages can be sent to your provider as well.   To learn more about what you can do with MyChart, go to [email or web address].    Your next appointment:   12 month(s)  The format for your next appointment:   In Person  Provider:   Larae Grooms, MD     Other  Instructions You have been referred to [facility]

## 2021-07-09 ENCOUNTER — Telehealth: Payer: Self-pay | Admitting: *Deleted

## 2021-07-09 ENCOUNTER — Telehealth: Payer: Self-pay | Admitting: Interventional Cardiology

## 2021-07-09 DIAGNOSIS — H4312 Vitreous hemorrhage, left eye: Secondary | ICD-10-CM | POA: Diagnosis not present

## 2021-07-09 DIAGNOSIS — H35373 Puckering of macula, bilateral: Secondary | ICD-10-CM | POA: Diagnosis not present

## 2021-07-09 DIAGNOSIS — H33032 Retinal detachment with giant retinal tear, left eye: Secondary | ICD-10-CM | POA: Diagnosis not present

## 2021-07-09 DIAGNOSIS — H43812 Vitreous degeneration, left eye: Secondary | ICD-10-CM | POA: Diagnosis not present

## 2021-07-09 NOTE — Telephone Encounter (Signed)
° °  Pre-operative Risk Assessment    Patient Name: Victor Stark  DOB: [birth year] MRN: [id number]      Request for Surgical Clearance    Procedure:  Vitrectomy peeling of the internal membrane and Intravitreal kenalog injection to the left eye Date of Surgery:  Clearance 07-12-21                                 Surgeon:  Dr Sherlynn Stalls Surgeon's Group or Practice Name:   Phone number:  [phone] Fax number:  [phone] Att: Marye Round  Type of Clearance Requested:   - Medical  and medicine if there are any blood thinner   Type of Anesthesia:  MAC   Additional requests/questions:    Signed, Glyn Ade   07/09/2021, 2:01 PM

## 2021-07-09 NOTE — Telephone Encounter (Signed)
Jettie Booze, MD  Thompson Grayer, RN Do not start aspirin given the chance of cross reactivity with etodolac allergy  Patient notified.

## 2021-07-09 NOTE — Telephone Encounter (Signed)
° °  Primary Cardiologist: Larae Grooms, MD  Chart reviewed as part of pre-operative protocol coverage. Given past medical history and time since last visit, based on ACC/AHA guidelines, Victor Stark would be at acceptable risk for the planned procedure without further cardiovascular testing.   He is not currently on any anticoagulation medication.    I will route this recommendation to the requesting party via Epic fax function and remove from pre-op pool.  Please call with questions.  Jossie Ng. Alicja Everitt NP-C    07/09/2021, 2:24 PM [facility] [place] [address] Office [phone] Fax [phone]

## 2021-07-12 DIAGNOSIS — H33312 Horseshoe tear of retina without detachment, left eye: Secondary | ICD-10-CM | POA: Diagnosis not present

## 2021-07-12 DIAGNOSIS — H3581 Retinal edema: Secondary | ICD-10-CM | POA: Diagnosis not present

## 2021-07-12 DIAGNOSIS — H33332 Multiple defects of retina without detachment, left eye: Secondary | ICD-10-CM | POA: Diagnosis not present

## 2021-07-12 DIAGNOSIS — H26492 Other secondary cataract, left eye: Secondary | ICD-10-CM | POA: Diagnosis not present

## 2021-07-12 DIAGNOSIS — H4312 Vitreous hemorrhage, left eye: Secondary | ICD-10-CM | POA: Diagnosis not present

## 2021-07-12 DIAGNOSIS — H35372 Puckering of macula, left eye: Secondary | ICD-10-CM | POA: Diagnosis not present

## 2021-07-13 DIAGNOSIS — H33032 Retinal detachment with giant retinal tear, left eye: Secondary | ICD-10-CM | POA: Diagnosis not present

## 2021-07-13 DIAGNOSIS — H35372 Puckering of macula, left eye: Secondary | ICD-10-CM | POA: Diagnosis not present

## 2021-07-20 DIAGNOSIS — H35372 Puckering of macula, left eye: Secondary | ICD-10-CM | POA: Diagnosis not present

## 2021-08-05 NOTE — Progress Notes (Signed)
Office Note     CC:  AAA: 4.6cm in 2022. Requesting Provider:  Aretta Nip, MD  HPI: Victor Stark is a 81 y.o. (1941/03/17) male presenting at the request of .Rankins, Victor Salinas, MD with known 4.6 and an infrarenal abdominal aortic aneurysm.  Victor Stark has known about his aneurysm since 2015, and this has been followed yearly.  Referred to our office for further management now that the aneurysm has grown to over 4.5 cm.  Victor Stark is originally from [place], and moved down here for work 30 years ago.  He is an avid golfer with an 8 handicap, playing 3+ times weekly at [facility].  He lives an active lifestyle, denies claudication, denies rest pain, denies tissue loss in bilateral lower extremities.  He has grandchildren who are involved in sports, and spends the majority of his weekends spending time with them.  [place] denies back pain chest pain abdominal pain.  He has a family history of AAA-his sister who had hers repaired years ago.  The pt is  on a statin for cholesterol management.  The pt is not on a daily aspirin.   Other AC:  - The pt is not on medication for hypertension.   The pt is not diabetic.  Tobacco hx:  none  Past Medical History:  Diagnosis Date   AAA (abdominal aortic aneurysm) 10/21/2013   3.1 in 1/14.    Actinic keratoses    Dr. 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 Welton Flakes   Cough 03/22/2013   Followed in Pulmonary clinic/ [facility]/ [place] - try off acei 03/22/2013  > improved 04/19/2013     Diverticulitis 2012   Ganglion cyst of wrist    left- Dr. Daylene Katayama   Headache(784.0) 06/06/2013   HTN (hypertension) 04/19/2013   D/c ACEi 03/23/13 due to cough     Hx of cardiovascular stress test    LexiScan Myoview (9/15):  Normal study, EF 49% (visually looks normal; quantifies mildly decreased).  // Myoview 01/2019: EF 62, no ischemia or scar, low risk    Hyperlipidemia     Hypertension    Melanoma ([place]) 2013   Dr. Rozann Lesches   Prostate cancer [facility])    PVC's (premature ventricular contractions) 10/21/2013   Monitor in 2014 revealed normal sinus rhythm with PVCs    Solitary pulmonary nodule 03/23/2013   First detected 07/04/12 - See CT and f/u 09/12/12 improved     Spondylosis    Dr. 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   Patient is retired from [employer].   Patient drinks two servings of coffee and tea daily.   Social Determinants of Health   Financial Resource Strain: Not on file  Food Insecurity: Not on file  Transportation Needs: Not on file  Physical Activity: Not on file  Stress: Not on file  Social Connections: Not on file  Intimate Partner Violence: Not on file    Family History  Problem Relation Age of Onset   Heart attack Father    Asthma Sister    Heart attack Daughter 73    Current Outpatient Medications  Medication Sig Dispense  Refill   acetaminophen (TYLENOL) 325 MG tablet Take 325-650 mg by mouth every 6 (six) hours as needed (pain.).      allopurinol (ZYLOPRIM) 100 MG tablet Take 1 tablet by mouth daily.     Cholecalciferol (VITAMIN D) 2000 UNITS CAPS Take 2,000 Units by mouth daily.      Coenzyme Q10 200 MG capsule Take 200 mg by mouth daily.     meclizine (ANTIVERT) 25 MG tablet Take 25 mg by mouth 3 (three) times daily as needed for dizziness.     Multiple Vitamin (MULTIVITAMIN WITH MINERALS) TABS Take 1 tablet by mouth daily. Centrum Silver     Omega-3 Fatty Acids (OMEGA-3 EPA FISH OIL PO) Take 950 mg by mouth daily.     rosuvastatin (CRESTOR) 20 MG tablet Take 20 mg by mouth at bedtime.      telmisartan-hydrochlorothiazide (MICARDIS HCT) 80-25 MG tablet Take 0.5 tablets by mouth daily. 45 tablet 3   traZODone (DESYREL) 50 MG tablet Take 50 mg by mouth at bedtime.     valACYclovir (VALTREX) 500 MG tablet Take 500 mg by mouth as needed (BREAKOUTS).      No current facility-administered medications for this visit.    Allergies  Allergen Reactions   Ace Inhibitors Cough   Etodolac Hives and Swelling   Hydrocodone-Acetaminophen Swelling and Hives   Lipitor [Atorvastatin Calcium] Other (See Comments)    Muscle pain/fatigue   Lisinopril Other (See Comments)    Dry cough    Pravastatin Other (See Comments)    Muscle aches    Vicodin [Hydrocodone-Acetaminophen] Hives and Swelling   Dutasteride Other (See Comments)    Lack of therapeutic effect    Nsaids     Other reaction(s): CKD   Amoxicillin Rash     REVIEW OF SYSTEMS:   [X]  denotes positive finding, [ ]  denotes negative finding Cardiac  Comments:  Chest pain or chest pressure:    Shortness of breath upon exertion:    Short of breath when lying flat:    Irregular heart rhythm:        Vascular    Pain in calf, thigh, or hip brought on by ambulation:    Pain in feet at night that wakes you up from your sleep:     Blood clot in your veins:     Leg swelling:         Pulmonary    Oxygen at home:    Productive cough:     Wheezing:  Neurologic    Sudden weakness in arms or legs:     Sudden numbness in arms or legs:     Sudden onset of difficulty speaking or slurred speech:    Temporary loss of vision in one eye:     Problems with dizziness:         Gastrointestinal    Blood in stool:     Vomited blood:         Genitourinary    Burning when urinating:     Blood in urine:        Psychiatric    Major depression:         Hematologic    Bleeding problems:    Problems with blood clotting too easily:        Skin    Rashes or ulcers:        Constitutional    Fever or chills:      PHYSICAL EXAMINATION:  There were no vitals filed for this visit.  General:  WDWN in NAD; vital signs documented above Gait: Not observed HENT: WNL, normocephalic Pulmonary: normal non-labored breathing , without wheezing Cardiac: regular HR, Abdomen: soft, NT, no masses Skin: without rashes Vascular Exam/Pulses:  Right Left  Radial 2+ (normal) 2+ (normal)  Ulnar 2+ (normal) 2+ (normal)  Femoral    Popliteal nonpalpable nonpalpable  DP 2+ (normal) 2+ (normal)  PT 2+ (normal) 2+ (normal)   Extremities: without ischemic changes, without Gangrene , without cellulitis; without open wounds;  Musculoskeletal: no muscle wasting or atrophy  Neurologic: A&O X 3;  No focal weakness or paresthesias are detected Psychiatric:  The pt has Normal affect.   Non-Invasive Vascular Imaging:    Abdominal Aorta: There is evidence of abnormal dilatation of the distal  Abdominal aorta. The largest aortic measurement is 4.0 cm (transverse  plane), and appears to measure 4.6 cm in the sagittal plane. The largest  aortic diameter remains essentially  unchanged compared to prior exam. Previous diameter measurement was  obtained on 02/05/2021.     ASSESSMENT/PLAN: REMBERT BROWE is a 81 y.o. male presenting with 4.6 cm asymptomatic  infrarenal abdominal aortic aneurysm.  This has been followed for the last 8 years, with slow growth.  I discussed the natural history of aneurysms, and signs and symptoms of rupture.  I asked that he seek immediate medical attention should these occur.  Society for vascular surgery guidelines recommend yearly screening with abdominal aortic aneurysms greater than 4 cm in size.  I will see him in 1 year with repeat duplex ultrasound, as well as duplex ultrasound of bilateral popliteal arteries to ensure he does not have aneurysmal dilation, as this occurs in a small cohort of patients.  He was asked to continue his current medication regimen.  Broadus John, MD [facility] [phone]

## 2021-08-06 ENCOUNTER — Other Ambulatory Visit: Payer: Self-pay

## 2021-08-06 ENCOUNTER — Ambulatory Visit (HOSPITAL_COMMUNITY)
Admission: RE | Admit: 2021-08-06 | Discharge: 2021-08-06 | Disposition: A | Discharge status: Home / Self Care | Payer: Medicare HMO | Source: Ambulatory Visit | Attending: Interventional Cardiology | Admitting: Interventional Cardiology

## 2021-08-06 ENCOUNTER — Ambulatory Visit (HOSPITAL_COMMUNITY): Payer: Medicare HMO

## 2021-08-06 ENCOUNTER — Ambulatory Visit: Payer: Medicare HMO | Admitting: Vascular Surgery

## 2021-08-06 ENCOUNTER — Encounter: Payer: Self-pay | Admitting: Vascular Surgery

## 2021-08-06 VITALS — BP 115/77 | HR 81 | Temp 98.2°F | Resp 20 | Ht 69.0 in | Wt 164.0 lb

## 2021-08-06 DIAGNOSIS — I714 Abdominal aortic aneurysm, without rupture, unspecified: Secondary | ICD-10-CM | POA: Insufficient documentation

## 2021-08-10 ENCOUNTER — Ambulatory Visit (INDEPENDENT_AMBULATORY_CARE_PROVIDER_SITE_OTHER): Payer: Medicare HMO

## 2021-08-10 DIAGNOSIS — I495 Sick sinus syndrome: Secondary | ICD-10-CM

## 2021-08-10 DIAGNOSIS — N1832 Chronic kidney disease, stage 3b: Secondary | ICD-10-CM | POA: Diagnosis not present

## 2021-08-10 LAB — CUP PACEART REMOTE DEVICE CHECK
Battery Remaining Longevity: 144 mo
Battery Voltage: 3.03 V
Brady Statistic AP VP Percent: 0.04 %
Brady Statistic AP VS Percent: 98.61 %
Brady Statistic AS VP Percent: 0 %
Brady Statistic AS VS Percent: 1.35 %
Brady Statistic RA Percent Paced: 99.47 %
Brady Statistic RV Percent Paced: 0.04 %
Date Time Interrogation Session: 20230220183844
Implantable Lead Implant Date: 20210524
Implantable Lead Implant Date: 20210524
Implantable Lead Location: 753859
Implantable Lead Location: 753860
Implantable Lead Model: 5076
Implantable Lead Model: 5076
Implantable Pulse Generator Implant Date: 20210524
Lead Channel Impedance Value: 323 Ohm
Lead Channel Impedance Value: 494 Ohm
Lead Channel Impedance Value: 608 Ohm
Lead Channel Impedance Value: 836 Ohm
Lead Channel Pacing Threshold Amplitude: 0.625 V
Lead Channel Pacing Threshold Amplitude: 0.625 V
Lead Channel Pacing Threshold Pulse Width: 0.4 ms
Lead Channel Pacing Threshold Pulse Width: 0.4 ms
Lead Channel Sensing Intrinsic Amplitude: 3.25 mV
Lead Channel Sensing Intrinsic Amplitude: 3.25 mV
Lead Channel Sensing Intrinsic Amplitude: 7.75 mV
Lead Channel Sensing Intrinsic Amplitude: 7.75 mV
Lead Channel Setting Pacing Amplitude: 1.5 V
Lead Channel Setting Pacing Amplitude: 2.5 V
Lead Channel Setting Pacing Pulse Width: 0.4 ms
Lead Channel Setting Sensing Sensitivity: 1.2 mV

## 2021-08-11 DIAGNOSIS — H35371 Puckering of macula, right eye: Secondary | ICD-10-CM | POA: Diagnosis not present

## 2021-08-11 DIAGNOSIS — H3581 Retinal edema: Secondary | ICD-10-CM | POA: Diagnosis not present

## 2021-08-13 ENCOUNTER — Other Ambulatory Visit: Payer: Self-pay

## 2021-08-13 DIAGNOSIS — I714 Abdominal aortic aneurysm, without rupture, unspecified: Secondary | ICD-10-CM

## 2021-08-17 NOTE — Progress Notes (Signed)
Remote pacemaker transmission.   

## 2021-08-18 DIAGNOSIS — E785 Hyperlipidemia, unspecified: Secondary | ICD-10-CM | POA: Diagnosis not present

## 2021-08-18 DIAGNOSIS — H35372 Puckering of macula, left eye: Secondary | ICD-10-CM | POA: Diagnosis not present

## 2021-08-18 DIAGNOSIS — I714 Abdominal aortic aneurysm, without rupture, unspecified: Secondary | ICD-10-CM | POA: Diagnosis not present

## 2021-08-18 DIAGNOSIS — N1832 Chronic kidney disease, stage 3b: Secondary | ICD-10-CM | POA: Diagnosis not present

## 2021-08-18 DIAGNOSIS — H3581 Retinal edema: Secondary | ICD-10-CM | POA: Diagnosis not present

## 2021-08-18 DIAGNOSIS — D631 Anemia in chronic kidney disease: Secondary | ICD-10-CM | POA: Diagnosis not present

## 2021-08-18 DIAGNOSIS — I129 Hypertensive chronic kidney disease with stage 1 through stage 4 chronic kidney disease, or unspecified chronic kidney disease: Secondary | ICD-10-CM | POA: Diagnosis not present

## 2021-09-02 DIAGNOSIS — Z961 Presence of intraocular lens: Secondary | ICD-10-CM | POA: Diagnosis not present

## 2021-09-02 DIAGNOSIS — H5203 Hypermetropia, bilateral: Secondary | ICD-10-CM | POA: Diagnosis not present

## 2021-09-02 DIAGNOSIS — H4312 Vitreous hemorrhage, left eye: Secondary | ICD-10-CM | POA: Diagnosis not present

## 2021-10-01 DIAGNOSIS — Z0000 Encounter for general adult medical examination without abnormal findings: Secondary | ICD-10-CM | POA: Diagnosis not present

## 2021-10-01 DIAGNOSIS — Z1389 Encounter for screening for other disorder: Secondary | ICD-10-CM | POA: Diagnosis not present

## 2021-10-12 DIAGNOSIS — M109 Gout, unspecified: Secondary | ICD-10-CM | POA: Diagnosis not present

## 2021-10-12 DIAGNOSIS — G479 Sleep disorder, unspecified: Secondary | ICD-10-CM | POA: Diagnosis not present

## 2021-10-12 DIAGNOSIS — I10 Essential (primary) hypertension: Secondary | ICD-10-CM | POA: Diagnosis not present

## 2021-10-12 DIAGNOSIS — E1122 Type 2 diabetes mellitus with diabetic chronic kidney disease: Secondary | ICD-10-CM | POA: Diagnosis not present

## 2021-10-12 DIAGNOSIS — I495 Sick sinus syndrome: Secondary | ICD-10-CM | POA: Diagnosis not present

## 2021-10-12 DIAGNOSIS — N183 Chronic kidney disease, stage 3 unspecified: Secondary | ICD-10-CM | POA: Diagnosis not present

## 2021-10-12 DIAGNOSIS — E782 Mixed hyperlipidemia: Secondary | ICD-10-CM | POA: Diagnosis not present

## 2021-10-15 DIAGNOSIS — H4321 Crystalline deposits in vitreous body, right eye: Secondary | ICD-10-CM | POA: Diagnosis not present

## 2021-10-15 DIAGNOSIS — H35371 Puckering of macula, right eye: Secondary | ICD-10-CM | POA: Diagnosis not present

## 2021-10-15 DIAGNOSIS — H3581 Retinal edema: Secondary | ICD-10-CM | POA: Diagnosis not present

## 2021-10-15 DIAGNOSIS — H43811 Vitreous degeneration, right eye: Secondary | ICD-10-CM | POA: Diagnosis not present

## 2021-11-01 DIAGNOSIS — M542 Cervicalgia: Secondary | ICD-10-CM | POA: Diagnosis not present

## 2021-11-06 DIAGNOSIS — L03113 Cellulitis of right upper limb: Secondary | ICD-10-CM | POA: Diagnosis not present

## 2021-11-08 DIAGNOSIS — M545 Low back pain, unspecified: Secondary | ICD-10-CM | POA: Diagnosis not present

## 2021-11-08 DIAGNOSIS — M542 Cervicalgia: Secondary | ICD-10-CM | POA: Diagnosis not present

## 2021-11-09 ENCOUNTER — Ambulatory Visit (INDEPENDENT_AMBULATORY_CARE_PROVIDER_SITE_OTHER): Payer: Medicare HMO

## 2021-11-09 DIAGNOSIS — I495 Sick sinus syndrome: Secondary | ICD-10-CM | POA: Diagnosis not present

## 2021-11-10 LAB — CUP PACEART REMOTE DEVICE CHECK
Battery Remaining Longevity: 139 mo
Battery Voltage: 3.03 V
Brady Statistic AP VP Percent: 0.05 %
Brady Statistic AP VS Percent: 98.21 %
Brady Statistic AS VP Percent: 0 %
Brady Statistic AS VS Percent: 1.74 %
Brady Statistic RA Percent Paced: 99.42 %
Brady Statistic RV Percent Paced: 0.05 %
Date Time Interrogation Session: 20230522214048
Implantable Lead Implant Date: 20210524
Implantable Lead Implant Date: 20210524
Implantable Lead Location: 753859
Implantable Lead Location: 753860
Implantable Lead Model: 5076
Implantable Lead Model: 5076
Implantable Pulse Generator Implant Date: 20210524
Lead Channel Impedance Value: 304 Ohm
Lead Channel Impedance Value: 418 Ohm
Lead Channel Impedance Value: 589 Ohm
Lead Channel Impedance Value: 817 Ohm
Lead Channel Pacing Threshold Amplitude: 0.625 V
Lead Channel Pacing Threshold Amplitude: 0.625 V
Lead Channel Pacing Threshold Pulse Width: 0.4 ms
Lead Channel Pacing Threshold Pulse Width: 0.4 ms
Lead Channel Sensing Intrinsic Amplitude: 2.375 mV
Lead Channel Sensing Intrinsic Amplitude: 2.375 mV
Lead Channel Sensing Intrinsic Amplitude: 6.125 mV
Lead Channel Sensing Intrinsic Amplitude: 6.125 mV
Lead Channel Setting Pacing Amplitude: 1.5 V
Lead Channel Setting Pacing Amplitude: 2.5 V
Lead Channel Setting Pacing Pulse Width: 0.4 ms
Lead Channel Setting Sensing Sensitivity: 1.2 mV

## 2021-11-16 DIAGNOSIS — M542 Cervicalgia: Secondary | ICD-10-CM | POA: Diagnosis not present

## 2021-11-16 DIAGNOSIS — M545 Low back pain, unspecified: Secondary | ICD-10-CM | POA: Diagnosis not present

## 2021-11-23 NOTE — Progress Notes (Signed)
Remote pacemaker transmission.   

## 2021-12-08 DIAGNOSIS — Z85820 Personal history of malignant melanoma of skin: Secondary | ICD-10-CM | POA: Diagnosis not present

## 2021-12-08 DIAGNOSIS — D0361 Melanoma in situ of right upper limb, including shoulder: Secondary | ICD-10-CM | POA: Diagnosis not present

## 2021-12-08 DIAGNOSIS — L570 Actinic keratosis: Secondary | ICD-10-CM | POA: Diagnosis not present

## 2021-12-08 DIAGNOSIS — X32XXXD Exposure to sunlight, subsequent encounter: Secondary | ICD-10-CM | POA: Diagnosis not present

## 2021-12-08 DIAGNOSIS — Z1283 Encounter for screening for malignant neoplasm of skin: Secondary | ICD-10-CM | POA: Diagnosis not present

## 2021-12-08 DIAGNOSIS — L821 Other seborrheic keratosis: Secondary | ICD-10-CM | POA: Diagnosis not present

## 2021-12-08 DIAGNOSIS — Z08 Encounter for follow-up examination after completed treatment for malignant neoplasm: Secondary | ICD-10-CM | POA: Diagnosis not present

## 2021-12-22 DIAGNOSIS — S39012A Strain of muscle, fascia and tendon of lower back, initial encounter: Secondary | ICD-10-CM | POA: Diagnosis not present

## 2021-12-27 DIAGNOSIS — D0361 Melanoma in situ of right upper limb, including shoulder: Secondary | ICD-10-CM | POA: Diagnosis not present

## 2021-12-28 DIAGNOSIS — M545 Low back pain, unspecified: Secondary | ICD-10-CM | POA: Diagnosis not present

## 2021-12-28 DIAGNOSIS — M546 Pain in thoracic spine: Secondary | ICD-10-CM | POA: Diagnosis not present

## 2022-01-18 DIAGNOSIS — M791 Myalgia, unspecified site: Secondary | ICD-10-CM | POA: Diagnosis not present

## 2022-01-18 DIAGNOSIS — M545 Low back pain, unspecified: Secondary | ICD-10-CM | POA: Diagnosis not present

## 2022-01-18 DIAGNOSIS — M546 Pain in thoracic spine: Secondary | ICD-10-CM | POA: Diagnosis not present

## 2022-01-19 ENCOUNTER — Other Ambulatory Visit: Payer: Self-pay | Admitting: Physical Medicine and Rehabilitation

## 2022-01-19 ENCOUNTER — Other Ambulatory Visit (HOSPITAL_COMMUNITY): Payer: Self-pay | Admitting: Physical Medicine and Rehabilitation

## 2022-01-19 DIAGNOSIS — M545 Low back pain, unspecified: Secondary | ICD-10-CM

## 2022-01-21 DIAGNOSIS — S335XXD Sprain of ligaments of lumbar spine, subsequent encounter: Secondary | ICD-10-CM | POA: Diagnosis not present

## 2022-01-21 DIAGNOSIS — M545 Low back pain, unspecified: Secondary | ICD-10-CM | POA: Diagnosis not present

## 2022-01-21 DIAGNOSIS — M6281 Muscle weakness (generalized): Secondary | ICD-10-CM | POA: Diagnosis not present

## 2022-01-26 DIAGNOSIS — S335XXD Sprain of ligaments of lumbar spine, subsequent encounter: Secondary | ICD-10-CM | POA: Diagnosis not present

## 2022-01-26 DIAGNOSIS — M6281 Muscle weakness (generalized): Secondary | ICD-10-CM | POA: Diagnosis not present

## 2022-01-27 DIAGNOSIS — S335XXD Sprain of ligaments of lumbar spine, subsequent encounter: Secondary | ICD-10-CM | POA: Diagnosis not present

## 2022-01-27 DIAGNOSIS — M6281 Muscle weakness (generalized): Secondary | ICD-10-CM | POA: Diagnosis not present

## 2022-01-31 DIAGNOSIS — M6281 Muscle weakness (generalized): Secondary | ICD-10-CM | POA: Diagnosis not present

## 2022-01-31 DIAGNOSIS — S335XXD Sprain of ligaments of lumbar spine, subsequent encounter: Secondary | ICD-10-CM | POA: Diagnosis not present

## 2022-02-07 DIAGNOSIS — S335XXD Sprain of ligaments of lumbar spine, subsequent encounter: Secondary | ICD-10-CM | POA: Diagnosis not present

## 2022-02-07 DIAGNOSIS — M6281 Muscle weakness (generalized): Secondary | ICD-10-CM | POA: Diagnosis not present

## 2022-02-08 ENCOUNTER — Ambulatory Visit (INDEPENDENT_AMBULATORY_CARE_PROVIDER_SITE_OTHER): Payer: Medicare HMO

## 2022-02-08 DIAGNOSIS — I495 Sick sinus syndrome: Secondary | ICD-10-CM | POA: Diagnosis not present

## 2022-02-08 LAB — CUP PACEART REMOTE DEVICE CHECK
Battery Remaining Longevity: 135 mo
Battery Voltage: 3.02 V
Brady Statistic AP VP Percent: 0.04 %
Brady Statistic AP VS Percent: 99.12 %
Brady Statistic AS VP Percent: 0 %
Brady Statistic AS VS Percent: 0.84 %
Brady Statistic RA Percent Paced: 99.23 %
Brady Statistic RV Percent Paced: 0.04 %
Date Time Interrogation Session: 20230822055434
Implantable Lead Implant Date: 20210524
Implantable Lead Implant Date: 20210524
Implantable Lead Location: 753859
Implantable Lead Location: 753860
Implantable Lead Model: 5076
Implantable Lead Model: 5076
Implantable Pulse Generator Implant Date: 20210524
Lead Channel Impedance Value: 323 Ohm
Lead Channel Impedance Value: 399 Ohm
Lead Channel Impedance Value: 665 Ohm
Lead Channel Impedance Value: 893 Ohm
Lead Channel Pacing Threshold Amplitude: 0.5 V
Lead Channel Pacing Threshold Amplitude: 0.625 V
Lead Channel Pacing Threshold Pulse Width: 0.4 ms
Lead Channel Pacing Threshold Pulse Width: 0.4 ms
Lead Channel Sensing Intrinsic Amplitude: 2.125 mV
Lead Channel Sensing Intrinsic Amplitude: 2.125 mV
Lead Channel Sensing Intrinsic Amplitude: 8.125 mV
Lead Channel Sensing Intrinsic Amplitude: 8.125 mV
Lead Channel Setting Pacing Amplitude: 1.5 V
Lead Channel Setting Pacing Amplitude: 2.5 V
Lead Channel Setting Pacing Pulse Width: 0.4 ms
Lead Channel Setting Sensing Sensitivity: 1.2 mV

## 2022-02-09 DIAGNOSIS — M6281 Muscle weakness (generalized): Secondary | ICD-10-CM | POA: Diagnosis not present

## 2022-02-09 DIAGNOSIS — S335XXD Sprain of ligaments of lumbar spine, subsequent encounter: Secondary | ICD-10-CM | POA: Diagnosis not present

## 2022-02-15 DIAGNOSIS — M47816 Spondylosis without myelopathy or radiculopathy, lumbar region: Secondary | ICD-10-CM | POA: Diagnosis not present

## 2022-02-16 DIAGNOSIS — M6281 Muscle weakness (generalized): Secondary | ICD-10-CM | POA: Diagnosis not present

## 2022-02-16 DIAGNOSIS — S335XXD Sprain of ligaments of lumbar spine, subsequent encounter: Secondary | ICD-10-CM | POA: Diagnosis not present

## 2022-02-17 DIAGNOSIS — M6281 Muscle weakness (generalized): Secondary | ICD-10-CM | POA: Diagnosis not present

## 2022-02-17 DIAGNOSIS — S335XXD Sprain of ligaments of lumbar spine, subsequent encounter: Secondary | ICD-10-CM | POA: Diagnosis not present

## 2022-03-02 DIAGNOSIS — M47816 Spondylosis without myelopathy or radiculopathy, lumbar region: Secondary | ICD-10-CM | POA: Diagnosis not present

## 2022-03-02 DIAGNOSIS — Z23 Encounter for immunization: Secondary | ICD-10-CM | POA: Diagnosis not present

## 2022-03-07 DIAGNOSIS — N1832 Chronic kidney disease, stage 3b: Secondary | ICD-10-CM | POA: Diagnosis not present

## 2022-03-08 NOTE — Progress Notes (Signed)
Remote pacemaker transmission.   

## 2022-03-15 ENCOUNTER — Ambulatory Visit (HOSPITAL_COMMUNITY): Payer: Medicare HMO

## 2022-03-15 ENCOUNTER — Encounter (HOSPITAL_COMMUNITY): Payer: Self-pay

## 2022-03-16 DIAGNOSIS — E785 Hyperlipidemia, unspecified: Secondary | ICD-10-CM | POA: Diagnosis not present

## 2022-03-16 DIAGNOSIS — N1832 Chronic kidney disease, stage 3b: Secondary | ICD-10-CM | POA: Diagnosis not present

## 2022-03-16 DIAGNOSIS — I129 Hypertensive chronic kidney disease with stage 1 through stage 4 chronic kidney disease, or unspecified chronic kidney disease: Secondary | ICD-10-CM | POA: Diagnosis not present

## 2022-03-16 DIAGNOSIS — C61 Malignant neoplasm of prostate: Secondary | ICD-10-CM | POA: Diagnosis not present

## 2022-03-18 DIAGNOSIS — M109 Gout, unspecified: Secondary | ICD-10-CM | POA: Diagnosis not present

## 2022-03-18 DIAGNOSIS — I10 Essential (primary) hypertension: Secondary | ICD-10-CM | POA: Diagnosis not present

## 2022-03-18 DIAGNOSIS — Z885 Allergy status to narcotic agent status: Secondary | ICD-10-CM | POA: Diagnosis not present

## 2022-03-18 DIAGNOSIS — Z883 Allergy status to other anti-infective agents status: Secondary | ICD-10-CM | POA: Diagnosis not present

## 2022-03-18 DIAGNOSIS — E785 Hyperlipidemia, unspecified: Secondary | ICD-10-CM | POA: Diagnosis not present

## 2022-03-18 DIAGNOSIS — Z880 Allergy status to penicillin: Secondary | ICD-10-CM | POA: Diagnosis not present

## 2022-03-18 DIAGNOSIS — M1990 Unspecified osteoarthritis, unspecified site: Secondary | ICD-10-CM | POA: Diagnosis not present

## 2022-03-18 DIAGNOSIS — Z85828 Personal history of other malignant neoplasm of skin: Secondary | ICD-10-CM | POA: Diagnosis not present

## 2022-03-18 DIAGNOSIS — Z8249 Family history of ischemic heart disease and other diseases of the circulatory system: Secondary | ICD-10-CM | POA: Diagnosis not present

## 2022-03-18 DIAGNOSIS — Z87891 Personal history of nicotine dependence: Secondary | ICD-10-CM | POA: Diagnosis not present

## 2022-03-18 DIAGNOSIS — Z8546 Personal history of malignant neoplasm of prostate: Secondary | ICD-10-CM | POA: Diagnosis not present

## 2022-03-18 DIAGNOSIS — N529 Male erectile dysfunction, unspecified: Secondary | ICD-10-CM | POA: Diagnosis not present

## 2022-03-29 DIAGNOSIS — M19042 Primary osteoarthritis, left hand: Secondary | ICD-10-CM | POA: Diagnosis not present

## 2022-03-29 DIAGNOSIS — I10 Essential (primary) hypertension: Secondary | ICD-10-CM | POA: Diagnosis not present

## 2022-03-29 DIAGNOSIS — E782 Mixed hyperlipidemia: Secondary | ICD-10-CM | POA: Diagnosis not present

## 2022-03-29 DIAGNOSIS — M19041 Primary osteoarthritis, right hand: Secondary | ICD-10-CM | POA: Diagnosis not present

## 2022-03-29 DIAGNOSIS — Z08 Encounter for follow-up examination after completed treatment for malignant neoplasm: Secondary | ICD-10-CM | POA: Diagnosis not present

## 2022-03-29 DIAGNOSIS — Z6825 Body mass index (BMI) 25.0-25.9, adult: Secondary | ICD-10-CM | POA: Diagnosis not present

## 2022-03-29 DIAGNOSIS — E1122 Type 2 diabetes mellitus with diabetic chronic kidney disease: Secondary | ICD-10-CM | POA: Diagnosis not present

## 2022-03-29 DIAGNOSIS — Z85820 Personal history of malignant melanoma of skin: Secondary | ICD-10-CM | POA: Diagnosis not present

## 2022-03-29 DIAGNOSIS — L821 Other seborrheic keratosis: Secondary | ICD-10-CM | POA: Diagnosis not present

## 2022-03-29 DIAGNOSIS — Z1283 Encounter for screening for malignant neoplasm of skin: Secondary | ICD-10-CM | POA: Diagnosis not present

## 2022-04-06 DIAGNOSIS — J398 Other specified diseases of upper respiratory tract: Secondary | ICD-10-CM | POA: Diagnosis not present

## 2022-04-22 DIAGNOSIS — H43811 Vitreous degeneration, right eye: Secondary | ICD-10-CM | POA: Diagnosis not present

## 2022-04-22 DIAGNOSIS — H35371 Puckering of macula, right eye: Secondary | ICD-10-CM | POA: Diagnosis not present

## 2022-04-22 DIAGNOSIS — H3581 Retinal edema: Secondary | ICD-10-CM | POA: Diagnosis not present

## 2022-05-10 ENCOUNTER — Ambulatory Visit (INDEPENDENT_AMBULATORY_CARE_PROVIDER_SITE_OTHER): Payer: Medicare HMO

## 2022-05-10 DIAGNOSIS — I495 Sick sinus syndrome: Secondary | ICD-10-CM

## 2022-05-10 LAB — CUP PACEART REMOTE DEVICE CHECK
Battery Remaining Longevity: 133 mo
Battery Voltage: 3.02 V
Brady Statistic AP VP Percent: 0.04 %
Brady Statistic AP VS Percent: 98.43 %
Brady Statistic AS VP Percent: 0 %
Brady Statistic AS VS Percent: 1.53 %
Brady Statistic RA Percent Paced: 98.86 %
Brady Statistic RV Percent Paced: 0.04 %
Date Time Interrogation Session: 20231120210418
Implantable Lead Connection Status: 753985
Implantable Lead Connection Status: 753985
Implantable Lead Implant Date: 20210524
Implantable Lead Implant Date: 20210524
Implantable Lead Location: 753859
Implantable Lead Location: 753860
Implantable Lead Model: 5076
Implantable Lead Model: 5076
Implantable Pulse Generator Implant Date: 20210524
Lead Channel Impedance Value: 304 Ohm
Lead Channel Impedance Value: 437 Ohm
Lead Channel Impedance Value: 570 Ohm
Lead Channel Impedance Value: 779 Ohm
Lead Channel Pacing Threshold Amplitude: 0.625 V
Lead Channel Pacing Threshold Amplitude: 0.75 V
Lead Channel Pacing Threshold Pulse Width: 0.4 ms
Lead Channel Pacing Threshold Pulse Width: 0.4 ms
Lead Channel Sensing Intrinsic Amplitude: 3 mV
Lead Channel Sensing Intrinsic Amplitude: 3 mV
Lead Channel Sensing Intrinsic Amplitude: 7.875 mV
Lead Channel Sensing Intrinsic Amplitude: 7.875 mV
Lead Channel Setting Pacing Amplitude: 1.5 V
Lead Channel Setting Pacing Amplitude: 2.5 V
Lead Channel Setting Pacing Pulse Width: 0.4 ms
Lead Channel Setting Sensing Sensitivity: 1.2 mV
Zone Setting Status: 755011
Zone Setting Status: 755011

## 2022-05-18 ENCOUNTER — Ambulatory Visit: Payer: Medicare HMO | Attending: Internal Medicine | Admitting: Internal Medicine

## 2022-05-18 ENCOUNTER — Encounter: Payer: Self-pay | Admitting: Internal Medicine

## 2022-05-18 VITALS — BP 122/78 | HR 79 | Ht 69.0 in | Wt 170.6 lb

## 2022-05-18 DIAGNOSIS — I493 Ventricular premature depolarization: Secondary | ICD-10-CM

## 2022-05-18 DIAGNOSIS — I495 Sick sinus syndrome: Secondary | ICD-10-CM

## 2022-05-18 DIAGNOSIS — Z950 Presence of cardiac pacemaker: Secondary | ICD-10-CM

## 2022-05-18 NOTE — Patient Instructions (Signed)
Medication Instructions:  Your physician recommends that you continue on your current medications as directed. Please refer to the Current Medication list given to you today.  *If you need a refill on your cardiac medications before your next appointment, please call your pharmacy*   Lab Work: None ordered.  If you have labs (blood work) drawn today and your tests are completely normal, you will receive your results only by: [place] (if you have MyChart) OR A paper copy in the mail If you have any lab test that is abnormal or we need to change your treatment, we will call you to review the results.   Testing/Procedures: None ordered.    Follow-Up: At [facility], you and your health needs are our priority.  As part of our continuing mission to provide you with exceptional heart care, we have created designated Provider Care Teams.  These Care Teams include your primary Cardiologist (physician) and Advanced Practice Providers (APPs -  Physician Assistants and Nurse Practitioners) who all work together to provide you with the care you need, when you need it.  We recommend signing up for the patient portal called "MyChart".  Sign up information is provided on this After Visit Summary.  MyChart is used to connect with patients for Virtual Visits (Telemedicine).  Patients are able to view lab/test results, encounter notes, upcoming appointments, etc.  Non-urgent messages can be sent to your provider as well.   To learn more about what you can do with MyChart, go to [email or web address].    Your next appointment:   12 months with Dr Olin Pia PA  Important Information About Sugar

## 2022-05-18 NOTE — Progress Notes (Signed)
Patient Care Team: Lawerance Cruel, MD as PCP - General (Family Medicine) Jettie Booze, MD as PCP - Cardiology (Cardiology)   HPI  Victor Stark is a 81 y.o. 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  There have been no palpitations or syncope.  Some lightheadedness with standing particularly when he is playing golf.  His best round this year (2023) was a 30.  Date Cr K Hgb  6/21 1.9 4.6 11.4   4/22 1.74 4.6 11.3    Records and Results Reviewed   Past Medical History:  Diagnosis Date   AAA (abdominal aortic aneurysm) ([place]) 10/21/2013   3.1 in 1/14.    Actinic keratoses    Dr. Rozann Lesches   Aortic sclerosis 01/14/2016   Bradycardia 10/29/2014   Chronic kidney disease, stage III (moderate) ([place])    Dr. Welton Flakes   Cough 03/22/2013   Followed in Pulmonary clinic/ [facility]/ [place] - try off acei 03/22/2013  > improved 04/19/2013     Diverticulitis 2012   Ganglion cyst of wrist    left- Dr. Daylene Katayama   Headache(784.0) 06/06/2013   HTN (hypertension) 04/19/2013   D/c ACEi 03/23/13 due to cough     Hx of cardiovascular stress test    LexiScan Myoview (9/15):  Normal study, EF 49% (visually looks normal; quantifies mildly decreased).  // Myoview 01/2019: EF 62, no ischemia or scar, low risk    Hyperlipidemia    Hypertension    Melanoma ([place]) 2013   Dr. Rozann Lesches   Prostate cancer [facility])    PVC's (premature ventricular contractions) 10/21/2013   Monitor in 2014 revealed normal sinus rhythm with PVCs    Solitary pulmonary nodule 03/23/2013   First detected 07/04/12 - See CT and f/u 09/12/12 improved     Spondylosis    Dr. Brien Few    Past Surgical History:  Procedure Laterality Date    LUMBAR [place] IMPLANT N/A 11/11/2019   Procedure: PACEMAKER IMPLANT;  Surgeon: Deboraha Sprang, MD;  Location: [place] [facility];  Service: Cardiovascular;  Laterality: N/A;   PROSTATECTOMY     ROTATOR CUFF REPAIR  Jan 2014   SHOULDER SURGERY      Current Meds  Medication Sig   acetaminophen (TYLENOL) 325 MG tablet Take 325-650 mg by mouth every 6 (six) hours as needed (pain.).    allopurinol (ZYLOPRIM) 100 MG tablet Take 50 mg by mouth daily.   Cholecalciferol (VITAMIN D) 2000 UNITS CAPS Take 2,000 Units by mouth daily.    Coenzyme Q10 200 MG capsule Take 200 mg by mouth daily.   meclizine (ANTIVERT) 25 MG tablet Take 25 mg by mouth 3 (three) times daily as needed for dizziness.   Multiple Vitamin (MULTIVITAMIN WITH MINERALS) TABS Take 1 tablet by mouth daily. Centrum Silver   Omega-3 Fatty Acids (OMEGA-3 EPA FISH OIL PO) Take 950 mg by mouth daily.   rosuvastatin (CRESTOR) 20 MG tablet Take 20 mg by mouth at bedtime.    telmisartan-hydrochlorothiazide (MICARDIS HCT) 80-25 MG tablet Take 0.5 tablets by mouth daily.   traZODone (DESYREL) 50 MG tablet  Take 50 mg by mouth at bedtime.   valACYclovir (VALTREX) 500 MG tablet Take 500 mg by mouth as needed (BREAKOUTS).     Allergies  Allergen Reactions   Ace Inhibitors Cough   Etodolac Hives and Swelling   Hydrocodone-Acetaminophen Swelling and Hives   Lipitor [Atorvastatin Calcium] Other (See Comments)    Muscle pain/fatigue   Lisinopril Other (See Comments)    Dry cough    Pravastatin Other (See Comments)    Muscle aches    Vicodin [Hydrocodone-Acetaminophen] Hives and Swelling   Dutasteride Other (See Comments)    Lack of therapeutic effect    Nsaids     Other reaction(s): CKD   Amoxicillin Rash      Review of Systems negative except from HPI and PMH  Physical Exam BP 122/78   Pulse 79   Ht '5\' 9"'$  (1.753 m)   Wt 170 lb 9.6 oz (77.4 kg)   SpO2 96%   BMI 25.19 kg/m  Well developed and well  nourished in no acute distress HENT normal Neck supple with JVP-flat Clear Device pocket well healed; without hematoma or erythema.  There is no tethering  Regular rate and rhythm, no  gallop No  murmur Abd-soft with active BS No Clubbing cyanosis  edema Skin-warm and dry A & Oriented  Grossly normal sensory and motor function  ECG atrial pacing at 79 Intervals 34/14/40  Device function is  normal. Programming changes   See Paceart for details     Assessment and  Plan Sinus node dysfunction   HTN  Pacemaker-Medtronic  PVCs  PVCs are clinically quiet.  Blood pressures well controlled; some orthostatic lightheadedness.  For now we will continue him on his Micardis HCT 40/12.5 although it may be reasonable to think about discontinuing the hydrochlorothiazide  Heart rate excursion adequate  Current medicines are reviewed at length with the patient today .  The patient does not  have concerns regarding medicines.

## 2022-05-30 DIAGNOSIS — M545 Low back pain, unspecified: Secondary | ICD-10-CM | POA: Diagnosis not present

## 2022-06-03 NOTE — Progress Notes (Signed)
Remote pacemaker transmission.   

## 2022-06-08 DIAGNOSIS — H4312 Vitreous hemorrhage, left eye: Secondary | ICD-10-CM | POA: Diagnosis not present

## 2022-06-08 DIAGNOSIS — H35371 Puckering of macula, right eye: Secondary | ICD-10-CM | POA: Diagnosis not present

## 2022-06-08 DIAGNOSIS — H43811 Vitreous degeneration, right eye: Secondary | ICD-10-CM | POA: Diagnosis not present

## 2022-06-14 DIAGNOSIS — M545 Low back pain, unspecified: Secondary | ICD-10-CM | POA: Diagnosis not present

## 2022-06-14 DIAGNOSIS — M47896 Other spondylosis, lumbar region: Secondary | ICD-10-CM | POA: Diagnosis not present

## 2022-06-15 DIAGNOSIS — H4312 Vitreous hemorrhage, left eye: Secondary | ICD-10-CM | POA: Diagnosis not present

## 2022-06-15 DIAGNOSIS — H35371 Puckering of macula, right eye: Secondary | ICD-10-CM | POA: Diagnosis not present

## 2022-06-15 DIAGNOSIS — H43811 Vitreous degeneration, right eye: Secondary | ICD-10-CM | POA: Diagnosis not present

## 2022-06-27 DIAGNOSIS — R69 Illness, unspecified: Secondary | ICD-10-CM | POA: Diagnosis not present

## 2022-07-05 DIAGNOSIS — R69 Illness, unspecified: Secondary | ICD-10-CM | POA: Diagnosis not present

## 2022-07-13 DIAGNOSIS — Z08 Encounter for follow-up examination after completed treatment for malignant neoplasm: Secondary | ICD-10-CM | POA: Diagnosis not present

## 2022-07-13 DIAGNOSIS — X32XXXD Exposure to sunlight, subsequent encounter: Secondary | ICD-10-CM | POA: Diagnosis not present

## 2022-07-13 DIAGNOSIS — Z85820 Personal history of malignant melanoma of skin: Secondary | ICD-10-CM | POA: Diagnosis not present

## 2022-07-13 DIAGNOSIS — D225 Melanocytic nevi of trunk: Secondary | ICD-10-CM | POA: Diagnosis not present

## 2022-07-13 DIAGNOSIS — L570 Actinic keratosis: Secondary | ICD-10-CM | POA: Diagnosis not present

## 2022-07-13 DIAGNOSIS — L820 Inflamed seborrheic keratosis: Secondary | ICD-10-CM | POA: Diagnosis not present

## 2022-07-13 DIAGNOSIS — Z1283 Encounter for screening for malignant neoplasm of skin: Secondary | ICD-10-CM | POA: Diagnosis not present

## 2022-07-20 DIAGNOSIS — H43811 Vitreous degeneration, right eye: Secondary | ICD-10-CM | POA: Diagnosis not present

## 2022-07-20 DIAGNOSIS — H35371 Puckering of macula, right eye: Secondary | ICD-10-CM | POA: Diagnosis not present

## 2022-07-20 DIAGNOSIS — H4312 Vitreous hemorrhage, left eye: Secondary | ICD-10-CM | POA: Diagnosis not present

## 2022-07-25 DIAGNOSIS — R69 Illness, unspecified: Secondary | ICD-10-CM | POA: Diagnosis not present

## 2022-07-29 DIAGNOSIS — H4312 Vitreous hemorrhage, left eye: Secondary | ICD-10-CM | POA: Diagnosis not present

## 2022-07-29 DIAGNOSIS — H35371 Puckering of macula, right eye: Secondary | ICD-10-CM | POA: Diagnosis not present

## 2022-07-29 DIAGNOSIS — H43811 Vitreous degeneration, right eye: Secondary | ICD-10-CM | POA: Diagnosis not present

## 2022-08-01 DIAGNOSIS — H33332 Multiple defects of retina without detachment, left eye: Secondary | ICD-10-CM | POA: Diagnosis not present

## 2022-08-01 DIAGNOSIS — H4312 Vitreous hemorrhage, left eye: Secondary | ICD-10-CM | POA: Diagnosis not present

## 2022-08-01 DIAGNOSIS — H33312 Horseshoe tear of retina without detachment, left eye: Secondary | ICD-10-CM | POA: Diagnosis not present

## 2022-08-05 DIAGNOSIS — H4312 Vitreous hemorrhage, left eye: Secondary | ICD-10-CM | POA: Diagnosis not present

## 2022-08-08 DIAGNOSIS — Z6825 Body mass index (BMI) 25.0-25.9, adult: Secondary | ICD-10-CM | POA: Diagnosis not present

## 2022-08-09 ENCOUNTER — Ambulatory Visit (INDEPENDENT_AMBULATORY_CARE_PROVIDER_SITE_OTHER): Payer: Medicare HMO

## 2022-08-09 DIAGNOSIS — I495 Sick sinus syndrome: Secondary | ICD-10-CM

## 2022-08-09 LAB — CUP PACEART REMOTE DEVICE CHECK
Battery Remaining Longevity: 131 mo
Battery Voltage: 3.02 V
Brady Statistic AP VP Percent: 0.05 %
Brady Statistic AP VS Percent: 96.6 %
Brady Statistic AS VP Percent: 0 %
Brady Statistic AS VS Percent: 3.35 %
Brady Statistic RA Percent Paced: 99.63 %
Brady Statistic RV Percent Paced: 0.05 %
Date Time Interrogation Session: 20240220004728
Implantable Lead Connection Status: 753985
Implantable Lead Connection Status: 753985
Implantable Lead Implant Date: 20210524
Implantable Lead Implant Date: 20210524
Implantable Lead Location: 753859
Implantable Lead Location: 753860
Implantable Lead Model: 5076
Implantable Lead Model: 5076
Implantable Pulse Generator Implant Date: 20210524
Lead Channel Impedance Value: 323 Ohm
Lead Channel Impedance Value: 456 Ohm
Lead Channel Impedance Value: 646 Ohm
Lead Channel Impedance Value: 798 Ohm
Lead Channel Pacing Threshold Amplitude: 0.5 V
Lead Channel Pacing Threshold Amplitude: 0.625 V
Lead Channel Pacing Threshold Pulse Width: 0.4 ms
Lead Channel Pacing Threshold Pulse Width: 0.4 ms
Lead Channel Sensing Intrinsic Amplitude: 3.75 mV
Lead Channel Sensing Intrinsic Amplitude: 3.75 mV
Lead Channel Sensing Intrinsic Amplitude: 8.375 mV
Lead Channel Sensing Intrinsic Amplitude: 8.375 mV
Lead Channel Setting Pacing Amplitude: 1.5 V
Lead Channel Setting Pacing Amplitude: 2.5 V
Lead Channel Setting Pacing Pulse Width: 0.4 ms
Lead Channel Setting Sensing Sensitivity: 1.2 mV
Zone Setting Status: 755011
Zone Setting Status: 755011

## 2022-08-15 DIAGNOSIS — R69 Illness, unspecified: Secondary | ICD-10-CM | POA: Diagnosis not present

## 2022-08-29 DIAGNOSIS — N1832 Chronic kidney disease, stage 3b: Secondary | ICD-10-CM | POA: Diagnosis not present

## 2022-08-31 DIAGNOSIS — H4312 Vitreous hemorrhage, left eye: Secondary | ICD-10-CM | POA: Diagnosis not present

## 2022-08-31 DIAGNOSIS — T8522XA Displacement of intraocular lens, initial encounter: Secondary | ICD-10-CM | POA: Diagnosis not present

## 2022-09-02 ENCOUNTER — Telehealth: Payer: Self-pay | Admitting: Interventional Cardiology

## 2022-09-02 NOTE — Telephone Encounter (Signed)
Pt would like a callback regarding upcoming appt on 4/15. Please advise.

## 2022-09-02 NOTE — Telephone Encounter (Signed)
I spoke with patient. He is scheduled for AAA duplex on 4/26 and is asking if OK to have appointment with Dr Irish Lack on 4/15 prior to 4/26 appointment.  I told him it would be fine to keep appointment with Dr Irish Lack as scheduled.

## 2022-09-08 DIAGNOSIS — T85398A Other mechanical complication of other ocular prosthetic devices, implants and grafts, initial encounter: Secondary | ICD-10-CM | POA: Diagnosis not present

## 2022-09-08 DIAGNOSIS — Z9889 Other specified postprocedural states: Secondary | ICD-10-CM | POA: Diagnosis not present

## 2022-09-08 DIAGNOSIS — H4312 Vitreous hemorrhage, left eye: Secondary | ICD-10-CM | POA: Diagnosis not present

## 2022-09-09 DIAGNOSIS — E785 Hyperlipidemia, unspecified: Secondary | ICD-10-CM | POA: Diagnosis not present

## 2022-09-09 DIAGNOSIS — D631 Anemia in chronic kidney disease: Secondary | ICD-10-CM | POA: Diagnosis not present

## 2022-09-09 DIAGNOSIS — N1832 Chronic kidney disease, stage 3b: Secondary | ICD-10-CM | POA: Diagnosis not present

## 2022-09-09 DIAGNOSIS — I129 Hypertensive chronic kidney disease with stage 1 through stage 4 chronic kidney disease, or unspecified chronic kidney disease: Secondary | ICD-10-CM | POA: Diagnosis not present

## 2022-09-09 NOTE — Progress Notes (Signed)
Remote pacemaker transmission.   

## 2022-09-23 DIAGNOSIS — I714 Abdominal aortic aneurysm, without rupture, unspecified: Secondary | ICD-10-CM | POA: Diagnosis not present

## 2022-09-23 DIAGNOSIS — I10 Essential (primary) hypertension: Secondary | ICD-10-CM | POA: Diagnosis not present

## 2022-09-23 DIAGNOSIS — E1122 Type 2 diabetes mellitus with diabetic chronic kidney disease: Secondary | ICD-10-CM | POA: Diagnosis not present

## 2022-09-23 DIAGNOSIS — G479 Sleep disorder, unspecified: Secondary | ICD-10-CM | POA: Diagnosis not present

## 2022-09-23 DIAGNOSIS — N1832 Chronic kidney disease, stage 3b: Secondary | ICD-10-CM | POA: Diagnosis not present

## 2022-09-23 DIAGNOSIS — E782 Mixed hyperlipidemia: Secondary | ICD-10-CM | POA: Diagnosis not present

## 2022-09-23 DIAGNOSIS — Z6825 Body mass index (BMI) 25.0-25.9, adult: Secondary | ICD-10-CM | POA: Diagnosis not present

## 2022-09-23 DIAGNOSIS — Z8546 Personal history of malignant neoplasm of prostate: Secondary | ICD-10-CM | POA: Diagnosis not present

## 2022-09-23 DIAGNOSIS — Z950 Presence of cardiac pacemaker: Secondary | ICD-10-CM | POA: Diagnosis not present

## 2022-09-23 DIAGNOSIS — M109 Gout, unspecified: Secondary | ICD-10-CM | POA: Diagnosis not present

## 2022-09-23 DIAGNOSIS — I495 Sick sinus syndrome: Secondary | ICD-10-CM | POA: Diagnosis not present

## 2022-09-23 DIAGNOSIS — Z0000 Encounter for general adult medical examination without abnormal findings: Secondary | ICD-10-CM | POA: Diagnosis not present

## 2022-10-02 NOTE — Progress Notes (Unsigned)
Cardiology Office Note   Date:  10/03/2022   ID:  Victor Stark, DOB Oct 29, 1940, MRN [id number]  PCP:  Victor Floro, MD    No chief complaint on file.  Tachybradycardia syndrome  Wt Readings from Last 3 Encounters:  10/03/22 169 lb 9.6 oz (76.9 kg)  05/18/22 170 lb 9.6 oz (77.4 kg)  08/06/21 164 lb (74.4 kg)       History of Present Illness: Victor Stark is a 82 y.o. male    with a history of HTN, HL, prostate CA, CKD, AAA.  Saw PCP in 10/2013 for dizziness.  Referred back for bradycardia.  BP medications were adjusted some to see if this would help.  He changed to Valsartan 160 QD, then Valsartan/HCTZ 160/12.5 mg QOD alt with Valsartan 160 QD.  BP tended to run higher.  Then went to  Valsartan/HCTZ 160/12.5 QD.  BP optimal.  No further dizziness.     He has a hx of vertigo.  He says his symptoms were similar.  He did call our office and was placed on a 24 Hr Holter.  This demonstrated NSR, PVCs, one triplet.  No significant arrhythmias.     He was also evaluated with an exercise treadmill test in late 2015. He did not achieve target heart rate. He then underwent a nuclear stress test showing no evidence of ischemia.   Stress test in 01/2019: "Normal resting and stress perfusion. No ischemia or infarction EF 62%"   Switched to telmisartan/HCTZ due to shortage.    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 Graciela Husbands.  Noticed improved stamina after pacer was placed.   Home readings in 2024 are in the 130s/70s.  Continues to play golf several times a week.  Walks the dog a mile every day.  Denies : Chest pain. Dizziness. Leg edema. Nitroglycerin use. Orthopnea. Palpitations. Paroxysmal nocturnal dyspnea. Shortness of breath. Syncope.     Pacemaker checks are going well.  Had 3 eye surgeries in the last year.    Past Medical History:  Diagnosis Date   AAA (abdominal aortic aneurysm) 10/21/2013   3.1 in 1/14.     Actinic keratoses    Dr. 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 Nelda Severe   Cough 03/22/2013   Followed in Pulmonary clinic/ [facility]/ [place] - try off acei 03/22/2013  > improved 04/19/2013     Diverticulitis 2012   Ganglion cyst of wrist    left- Dr. Teressa Senter   Headache(784.0) 06/06/2013   HTN (hypertension) 04/19/2013   D/c ACEi 03/23/13 due to cough     Hx of cardiovascular stress test    LexiScan Myoview (9/15):  Normal study, EF 49% (visually looks normal; quantifies mildly decreased).  // Myoview 01/2019: EF 62, no ischemia or scar, low risk    Hyperlipidemia    Hypertension    Melanoma 2013   Dr. Charlton Haws   Prostate cancer    PVC's (premature ventricular contractions) 10/21/2013   Monitor in 2014 revealed normal sinus rhythm with PVCs    Solitary pulmonary nodule 03/23/2013   First detected 07/04/12 - See CT and f/u 09/12/12 improved     Spondylosis    Dr. Murray Hodgkins    Past Surgical History:  Procedure Laterality Date   LUMBAR DISC SURGERY     PACEMAKER IMPLANT N/A 11/11/2019   Procedure: PACEMAKER IMPLANT;  Surgeon: Duke Salvia, MD;  Location: [facility] INVASIVE CV LAB;  Service: Cardiovascular;  Laterality: N/A;   PROSTATECTOMY     ROTATOR CUFF REPAIR  Jan 2014   SHOULDER SURGERY       Current Outpatient Medications  Medication Sig Dispense Refill   acetaminophen (TYLENOL) 325 MG tablet Take 325-650 mg by mouth every 6 (six) hours as needed (pain.).      allopurinol (ZYLOPRIM) 100 MG tablet Take 50 mg by mouth daily.     Cholecalciferol (VITAMIN D) 2000 UNITS CAPS Take 2,000 Units by mouth daily.      Coenzyme Q10 200 MG capsule Take 200 mg by mouth daily.     meclizine (ANTIVERT) 25 MG tablet Take 25 mg by mouth 3 (three) times daily as needed for dizziness.     Multiple Vitamin (MULTIVITAMIN WITH MINERALS) TABS Take 1 tablet by mouth daily. Centrum Silver     Omega-3 Fatty Acids (OMEGA-3 EPA  FISH OIL PO) Take 950 mg by mouth daily.     prednisoLONE acetate (PRED FORTE) 1 % ophthalmic suspension Place 1 drop into the left eye 4 (four) times daily.     rosuvastatin (CRESTOR) 20 MG tablet Take 20 mg by mouth at bedtime.      telmisartan (MICARDIS) 40 MG tablet Take 40 mg by mouth daily.     traZODone (DESYREL) 50 MG tablet Take 50 mg by mouth at bedtime.     valACYclovir (VALTREX) 500 MG tablet Take 500 mg by mouth as needed (BREAKOUTS).      No current facility-administered medications for this visit.    Allergies:   Ace inhibitors, Atorvastatin, Etodolac, Hydrocodone-acetaminophen, Lipitor [atorvastatin calcium], Lisinopril, Pravastatin, Vicodin [hydrocodone-acetaminophen], Dutasteride, Hydrocodone, Nsaids, and Amoxicillin    Social History:  The patient  reports that he quit smoking about 30 years ago. His smoking use included cigarettes. He has a 11.25 pack-year smoking history. He has never used smokeless tobacco. He reports current alcohol use. He reports that he does not use drugs.   Family History:  The patient's family history includes Asthma in his sister; Heart attack in his father; Heart attack (age of onset: 19) in his daughter.    ROS:  Please see the history of present illness.   Otherwise, review of systems are positive for .   All other systems are reviewed and negative.    PHYSICAL EXAM: VS:  BP 133/68   Pulse 72   Ht  (1.753 m)   Wt 169 lb 9.6 oz (76.9 kg)   SpO2 96%   BMI 25.05 kg/m  , BMI Body mass index is 25.05 kg/m. GEN: Well nourished, well developed, in no acute distress HEENT: normal Neck: no JVD, carotid bruits, or masses Cardiac: RRR; no murmurs, rubs, or gallops,no edema  Respiratory:  clear to auscultation bilaterally, normal work of breathing GI: soft, nontender, nondistended, + BS MS: no deformity or atrophy Skin: warm and dry, no rash Neuro:  Strength and sensation are intact Psych: euthymic mood, full affect   EKG:   The ekg  ordered 11/23 demonstrates atrial pacing   Recent Labs: No results found for requested labs within last 365 days.   Lipid Panel No results found for: "CHOL", "TRIG", "HDL", "CHOLHDL", "VLDL", "LDLCALC", "LDLDIRECT"   Other studies Reviewed: Additional studies/ records that were reviewed today with results demonstrating: April 2024 total cholesterol 135 HDL 44 LDL 56 triglycerides 221 A1c 6.6 creatinine 2.0.   ASSESSMENT AND PLAN:  Tachycardia/bradycardia syndrome: Status post  pacemaker.  Medtronic pacemaker placed in May 2021.  Pacemaker follow-up with electrophysiology Abdominal aortic aneurysm/aortic atherosclerosis: Increase in size between 2021 and 2022.  Last check in February 2023 showed no change from 2022 at 4.6 cm.  He needs a repeat scan.  Continue rosuvastatin.  In 2022, LDL was 69 on rosuvastatin. No abdominal pain.  Avoids straining and heavy lifting.  Similar CKD 3B: Avoid NSAIDs.  Stay well-hydrated. Aortic sclerosis: Noted on prior echocardiogram. PVCs: Noted on prior rhythm monitoring. Prediabetes: Whole food, plant-based diet.  High-fiber diet.  Avoid processed foods.  A1C increased 6.6. Hypertension: Low-salt diet.  Home readings controlled.  Repeat BP improved today.    Current medicines are reviewed at length with the patient today.  The patient concerns regarding his medicines were addressed.  The following changes have been made:  No change  Labs/ tests ordered today include:  No orders of the defined types were placed in this encounter.   Recommend 150 minutes/week of aerobic exercise Low fat, low carb, high fiber diet recommended  Disposition:   FU in 1 year   Signed, Lance Muss, MD  10/03/2022 11:03 AM    [facility] [address] [place], [place], [place]  [address] Phone: [phone]; Fax: [phone]

## 2022-10-03 ENCOUNTER — Encounter: Payer: Self-pay | Admitting: Interventional Cardiology

## 2022-10-03 ENCOUNTER — Ambulatory Visit: Payer: Medicare HMO | Attending: Interventional Cardiology | Admitting: Interventional Cardiology

## 2022-10-03 VITALS — BP 133/68 | HR 72 | Ht 69.0 in | Wt 169.6 lb

## 2022-10-03 DIAGNOSIS — I358 Other nonrheumatic aortic valve disorders: Secondary | ICD-10-CM

## 2022-10-03 DIAGNOSIS — I493 Ventricular premature depolarization: Secondary | ICD-10-CM | POA: Diagnosis not present

## 2022-10-03 DIAGNOSIS — Z950 Presence of cardiac pacemaker: Secondary | ICD-10-CM | POA: Diagnosis not present

## 2022-10-03 DIAGNOSIS — I7143 Infrarenal abdominal aortic aneurysm, without rupture: Secondary | ICD-10-CM | POA: Diagnosis not present

## 2022-10-03 DIAGNOSIS — N1832 Chronic kidney disease, stage 3b: Secondary | ICD-10-CM

## 2022-10-03 DIAGNOSIS — I10 Essential (primary) hypertension: Secondary | ICD-10-CM | POA: Diagnosis not present

## 2022-10-03 DIAGNOSIS — I700 Atherosclerosis of aorta: Secondary | ICD-10-CM

## 2022-10-03 DIAGNOSIS — I495 Sick sinus syndrome: Secondary | ICD-10-CM | POA: Diagnosis not present

## 2022-10-03 NOTE — Patient Instructions (Signed)
Medication Instructions:  Your physician recommends that you continue on your current medications as directed. Please refer to the Current Medication list given to you today.  *If you need a refill on your cardiac medications before your next appointment, please call your pharmacy*   Lab Work: none If you have labs (blood work) drawn today and your tests are completely normal, you will receive your results only by: MyChart Message (if you have MyChart) OR A paper copy in the mail If you have any lab test that is abnormal or we need to change your treatment, we will call you to review the results.   Testing/Procedures: none   Follow-Up: At [facility], you and your health needs are our priority.  As part of our continuing mission to provide you with exceptional heart care, we have created designated Provider Care Teams.  These Care Teams include your primary Cardiologist (physician) and Advanced Practice Providers (APPs -  Physician Assistants and Nurse Practitioners) who all work together to provide you with the care you need, when you need it.  We recommend signing up for the patient portal called "MyChart".  Sign up information is provided on this After Visit Summary.  MyChart is used to connect with patients for Virtual Visits (Telemedicine).  Patients are able to view lab/test results, encounter notes, upcoming appointments, etc.  Non-urgent messages can be sent to your provider as well.   To learn more about what you can do with MyChart, go to [email or web address].    Your next appointment:   12 month(s)  Provider:   Jayadeep Varanasi, MD     Other Instructions    

## 2022-10-04 ENCOUNTER — Other Ambulatory Visit: Payer: Self-pay | Admitting: *Deleted

## 2022-10-04 DIAGNOSIS — I714 Abdominal aortic aneurysm, without rupture, unspecified: Secondary | ICD-10-CM

## 2022-10-04 DIAGNOSIS — I724 Aneurysm of artery of lower extremity: Secondary | ICD-10-CM

## 2022-10-05 DIAGNOSIS — R69 Illness, unspecified: Secondary | ICD-10-CM | POA: Diagnosis not present

## 2022-10-07 ENCOUNTER — Other Ambulatory Visit (HOSPITAL_COMMUNITY): Payer: Medicare HMO

## 2022-10-07 ENCOUNTER — Ambulatory Visit: Payer: Medicare HMO

## 2022-10-07 DIAGNOSIS — Z1389 Encounter for screening for other disorder: Secondary | ICD-10-CM | POA: Diagnosis not present

## 2022-10-07 DIAGNOSIS — Z0000 Encounter for general adult medical examination without abnormal findings: Secondary | ICD-10-CM | POA: Diagnosis not present

## 2022-10-07 DIAGNOSIS — E663 Overweight: Secondary | ICD-10-CM | POA: Diagnosis not present

## 2022-10-10 DIAGNOSIS — T85398A Other mechanical complication of other ocular prosthetic devices, implants and grafts, initial encounter: Secondary | ICD-10-CM | POA: Diagnosis not present

## 2022-10-10 DIAGNOSIS — H4312 Vitreous hemorrhage, left eye: Secondary | ICD-10-CM | POA: Diagnosis not present

## 2022-10-12 DIAGNOSIS — Z85820 Personal history of malignant melanoma of skin: Secondary | ICD-10-CM | POA: Diagnosis not present

## 2022-10-12 DIAGNOSIS — D225 Melanocytic nevi of trunk: Secondary | ICD-10-CM | POA: Diagnosis not present

## 2022-10-12 DIAGNOSIS — L821 Other seborrheic keratosis: Secondary | ICD-10-CM | POA: Diagnosis not present

## 2022-10-12 DIAGNOSIS — M25561 Pain in right knee: Secondary | ICD-10-CM | POA: Diagnosis not present

## 2022-10-12 DIAGNOSIS — Z08 Encounter for follow-up examination after completed treatment for malignant neoplasm: Secondary | ICD-10-CM | POA: Diagnosis not present

## 2022-10-14 ENCOUNTER — Ambulatory Visit (INDEPENDENT_AMBULATORY_CARE_PROVIDER_SITE_OTHER)
Admission: RE | Admit: 2022-10-14 | Discharge: 2022-10-14 | Disposition: A | Discharge status: Home / Self Care | Payer: Medicare HMO | Source: Ambulatory Visit | Attending: Vascular Surgery | Admitting: Vascular Surgery

## 2022-10-14 ENCOUNTER — Ambulatory Visit (HOSPITAL_COMMUNITY)
Admission: RE | Admit: 2022-10-14 | Discharge: 2022-10-14 | Disposition: A | Discharge status: Home / Self Care | Payer: Medicare HMO | Source: Ambulatory Visit | Attending: Vascular Surgery | Admitting: Vascular Surgery

## 2022-10-14 ENCOUNTER — Ambulatory Visit: Payer: Medicare HMO | Admitting: Physician Assistant

## 2022-10-14 VITALS — BP 159/89 | HR 77 | Temp 97.8°F | Resp 16 | Ht 70.0 in | Wt 170.0 lb

## 2022-10-14 DIAGNOSIS — I724 Aneurysm of artery of lower extremity: Secondary | ICD-10-CM | POA: Insufficient documentation

## 2022-10-14 DIAGNOSIS — I714 Abdominal aortic aneurysm, without rupture, unspecified: Secondary | ICD-10-CM | POA: Diagnosis not present

## 2022-10-14 NOTE — Progress Notes (Signed)
Office Note   History of Present Illness   Victor Stark is a 82 y.o. (02-Jun-1941) male who presents for surveillance of AAA.  He is known about his aneurysm since 2015 and this has been followed yearly.  His last known largest measurement was 3.96 cm.  He returns today for follow-up.  He denies any abdominal, chest, or back pain.  He also denies any claudication, rest pain, or tissue loss to the lower extremities.  He stays fairly active and golfs 3-4 times a week.  He also walks his dog daily.   He does have a family history of AAA.  His sister has had repair years ago.  Current Outpatient Medications  Medication Sig Dispense Refill   acetaminophen (TYLENOL) 325 MG tablet Take 325-650 mg by mouth every 6 (six) hours as needed (pain.).      allopurinol (ZYLOPRIM) 100 MG tablet Take 50 mg by mouth daily.     Cholecalciferol (VITAMIN D) 2000 UNITS CAPS Take 2,000 Units by mouth daily.      Coenzyme Q10 200 MG capsule Take 200 mg by mouth daily.     meclizine (ANTIVERT) 25 MG tablet Take 25 mg by mouth 3 (three) times daily as needed for dizziness.     Multiple Vitamin (MULTIVITAMIN WITH MINERALS) TABS Take 1 tablet by mouth daily. Centrum Silver     Omega-3 Fatty Acids (OMEGA-3 EPA FISH OIL PO) Take 950 mg by mouth daily.     prednisoLONE acetate (PRED FORTE) 1 % ophthalmic suspension Place 1 drop into the left eye 4 (four) times daily.     rosuvastatin (CRESTOR) 20 MG tablet Take 20 mg by mouth at bedtime.      telmisartan (MICARDIS) 40 MG tablet Take 40 mg by mouth daily.     traZODone (DESYREL) 50 MG tablet Take 50 mg by mouth at bedtime.     valACYclovir (VALTREX) 500 MG tablet Take 500 mg by mouth as needed (BREAKOUTS).      No current facility-administered medications for this visit.    REVIEW OF SYSTEMS (negative unless checked):   Cardiac:  []  Chest pain or chest pressure? []  Shortness of breath upon activity? []  Shortness of breath when lying flat? []  Irregular  heart rhythm?  Vascular:  []  Pain in calf, thigh, or hip brought on by walking? []  Pain in feet at night that wakes you up from your sleep? []  Blood clot in your veins? []  Leg swelling?  Pulmonary:  []  Oxygen at home? []  Productive cough? []  Wheezing?  Neurologic:  []  Sudden weakness in arms or legs? []  Sudden numbness in arms or legs? []  Sudden onset of difficult speaking or slurred speech? []  Temporary loss of vision in one eye? []  Problems with dizziness?  Gastrointestinal:  []  Blood in stool? []  Vomited blood?  Genitourinary:  []  Burning when urinating? []  Blood in urine?  Psychiatric:  []  Major depression  Hematologic:  []  Bleeding problems? []  Problems with blood clotting?  Dermatologic:  []  Rashes or ulcers?  Constitutional:  []  Fever or chills?  Ear/Nose/Throat:  []  Change in hearing? []  Nose bleeds? []  Sore throat?  Musculoskeletal:  []  Back pain? []  Joint pain? []  Muscle pain?   Physical Examination   Vitals:   10/14/22 1001  BP: (!) 159/89  Pulse: 77  Resp: 16  Temp: 97.8 F (36.6 C)  TempSrc: Temporal  SpO2: 95%  Weight: 170 lb (77.1 kg)  Height: 5\' 10"  (1.778 m)   Body mass  index is 24.39 kg/m.  General:  WDWN in NAD; vital signs documented above Gait: Not observed HENT: WNL, normocephalic Pulmonary: normal non-labored breathing  Cardiac: Regular Abdomen: soft, NT, no masses Skin: without rashes Vascular Exam/Pulses: 2+ DP/PT bilaterally Extremities: without ischemic changes, without Gangrene , without cellulitis; without open wounds;  Musculoskeletal: no muscle wasting or atrophy  Neurologic: A&O X 3;  No focal weakness or paresthesias are detected Psychiatric:  The pt has Normal affect.   Non-Invasive Vascular Imaging   AAA Duplex (10/14/2022) Current size: 3.93 cm Previous size: 3.96 cm (2023) R CIA: 1.7 cm L CIA: 1.5 cm  BLE Popliteal Duplex No evidence of popliteal aneurysms bilaterally   Medical Decision  Making   Victor Stark is a 82 y.o. (February 03, 1941) male who presents for surveillance of AAA  Based on this patient's duplex, his AAA is stable in size with current maximum diameter of 3.93 cm The patient has never had any imaging of his popliteal arteries to rule out aneurysm.  Duplex today demonstrates no evidence of popliteal aneurysms bilaterally He denies any back, chest, or abdominal pain. He denies any rest pain or claudication. He stays fairly active He will follow up with our office in 1 yr with repeat AAA duplex   Loel Dubonnet PA-C [facility] Office: [phone]  Clinic MD: Karin Lieu

## 2022-10-17 DIAGNOSIS — R69 Illness, unspecified: Secondary | ICD-10-CM | POA: Diagnosis not present

## 2022-10-24 ENCOUNTER — Other Ambulatory Visit: Payer: Self-pay

## 2022-10-24 DIAGNOSIS — I714 Abdominal aortic aneurysm, without rupture, unspecified: Secondary | ICD-10-CM

## 2022-10-25 DIAGNOSIS — R69 Illness, unspecified: Secondary | ICD-10-CM | POA: Diagnosis not present

## 2022-11-08 ENCOUNTER — Ambulatory Visit: Payer: Medicare HMO | Attending: Internal Medicine

## 2022-11-08 DIAGNOSIS — E785 Hyperlipidemia, unspecified: Secondary | ICD-10-CM | POA: Diagnosis not present

## 2022-11-08 DIAGNOSIS — I495 Sick sinus syndrome: Secondary | ICD-10-CM | POA: Diagnosis not present

## 2022-11-08 DIAGNOSIS — G3184 Mild cognitive impairment, so stated: Secondary | ICD-10-CM | POA: Diagnosis not present

## 2022-11-08 DIAGNOSIS — I719 Aortic aneurysm of unspecified site, without rupture: Secondary | ICD-10-CM | POA: Diagnosis not present

## 2022-11-08 DIAGNOSIS — G4700 Insomnia, unspecified: Secondary | ICD-10-CM | POA: Diagnosis not present

## 2022-11-08 DIAGNOSIS — M4800 Spinal stenosis, site unspecified: Secondary | ICD-10-CM | POA: Diagnosis not present

## 2022-11-08 DIAGNOSIS — Z880 Allergy status to penicillin: Secondary | ICD-10-CM | POA: Diagnosis not present

## 2022-11-08 DIAGNOSIS — Z87891 Personal history of nicotine dependence: Secondary | ICD-10-CM | POA: Diagnosis not present

## 2022-11-08 DIAGNOSIS — N1832 Chronic kidney disease, stage 3b: Secondary | ICD-10-CM | POA: Diagnosis not present

## 2022-11-08 DIAGNOSIS — N529 Male erectile dysfunction, unspecified: Secondary | ICD-10-CM | POA: Diagnosis not present

## 2022-11-08 DIAGNOSIS — Z8249 Family history of ischemic heart disease and other diseases of the circulatory system: Secondary | ICD-10-CM | POA: Diagnosis not present

## 2022-11-08 DIAGNOSIS — M1990 Unspecified osteoarthritis, unspecified site: Secondary | ICD-10-CM | POA: Diagnosis not present

## 2022-11-08 LAB — CUP PACEART REMOTE DEVICE CHECK
Battery Remaining Longevity: 128 mo
Battery Voltage: 3.02 V
Brady Statistic AP VP Percent: 0.03 %
Brady Statistic AP VS Percent: 98.37 %
Brady Statistic AS VP Percent: 0 %
Brady Statistic AS VS Percent: 1.6 %
Brady Statistic RA Percent Paced: 99.21 %
Brady Statistic RV Percent Paced: 0.03 %
Date Time Interrogation Session: 20240521064358
Implantable Lead Connection Status: 753985
Implantable Lead Connection Status: 753985
Implantable Lead Implant Date: 20210524
Implantable Lead Implant Date: 20210524
Implantable Lead Location: 753859
Implantable Lead Location: 753860
Implantable Lead Model: 5076
Implantable Lead Model: 5076
Implantable Pulse Generator Implant Date: 20210524
Lead Channel Impedance Value: 323 Ohm
Lead Channel Impedance Value: 456 Ohm
Lead Channel Impedance Value: 684 Ohm
Lead Channel Impedance Value: 874 Ohm
Lead Channel Pacing Threshold Amplitude: 0.5 V
Lead Channel Pacing Threshold Amplitude: 0.625 V
Lead Channel Pacing Threshold Pulse Width: 0.4 ms
Lead Channel Pacing Threshold Pulse Width: 0.4 ms
Lead Channel Sensing Intrinsic Amplitude: 2.625 mV
Lead Channel Sensing Intrinsic Amplitude: 2.625 mV
Lead Channel Sensing Intrinsic Amplitude: 7.125 mV
Lead Channel Sensing Intrinsic Amplitude: 7.125 mV
Lead Channel Setting Pacing Amplitude: 1.5 V
Lead Channel Setting Pacing Amplitude: 2.5 V
Lead Channel Setting Pacing Pulse Width: 0.4 ms
Lead Channel Setting Sensing Sensitivity: 1.2 mV
Zone Setting Status: 755011
Zone Setting Status: 755011

## 2022-11-09 DIAGNOSIS — R69 Illness, unspecified: Secondary | ICD-10-CM | POA: Diagnosis not present

## 2022-11-16 DIAGNOSIS — R69 Illness, unspecified: Secondary | ICD-10-CM | POA: Diagnosis not present

## 2022-11-18 DIAGNOSIS — M1711 Unilateral primary osteoarthritis, right knee: Secondary | ICD-10-CM | POA: Diagnosis not present

## 2022-11-21 DIAGNOSIS — H4312 Vitreous hemorrhage, left eye: Secondary | ICD-10-CM | POA: Diagnosis not present

## 2022-11-21 DIAGNOSIS — T85398A Other mechanical complication of other ocular prosthetic devices, implants and grafts, initial encounter: Secondary | ICD-10-CM | POA: Diagnosis not present

## 2022-11-28 DIAGNOSIS — N1832 Chronic kidney disease, stage 3b: Secondary | ICD-10-CM | POA: Diagnosis not present

## 2022-11-30 DIAGNOSIS — M1711 Unilateral primary osteoarthritis, right knee: Secondary | ICD-10-CM | POA: Diagnosis not present

## 2022-12-01 NOTE — Progress Notes (Signed)
Remote pacemaker transmission.   

## 2022-12-07 DIAGNOSIS — M1711 Unilateral primary osteoarthritis, right knee: Secondary | ICD-10-CM | POA: Diagnosis not present

## 2022-12-09 DIAGNOSIS — I129 Hypertensive chronic kidney disease with stage 1 through stage 4 chronic kidney disease, or unspecified chronic kidney disease: Secondary | ICD-10-CM | POA: Diagnosis not present

## 2022-12-09 DIAGNOSIS — M1990 Unspecified osteoarthritis, unspecified site: Secondary | ICD-10-CM | POA: Diagnosis not present

## 2022-12-09 DIAGNOSIS — N1832 Chronic kidney disease, stage 3b: Secondary | ICD-10-CM | POA: Diagnosis not present

## 2022-12-09 DIAGNOSIS — E785 Hyperlipidemia, unspecified: Secondary | ICD-10-CM | POA: Diagnosis not present

## 2022-12-14 DIAGNOSIS — M1711 Unilateral primary osteoarthritis, right knee: Secondary | ICD-10-CM | POA: Diagnosis not present

## 2022-12-20 DIAGNOSIS — R69 Illness, unspecified: Secondary | ICD-10-CM | POA: Diagnosis not present

## 2022-12-26 DIAGNOSIS — R051 Acute cough: Secondary | ICD-10-CM | POA: Diagnosis not present

## 2022-12-26 DIAGNOSIS — R5383 Other fatigue: Secondary | ICD-10-CM | POA: Diagnosis not present

## 2022-12-26 DIAGNOSIS — J208 Acute bronchitis due to other specified organisms: Secondary | ICD-10-CM | POA: Diagnosis not present

## 2022-12-26 DIAGNOSIS — Z6826 Body mass index (BMI) 26.0-26.9, adult: Secondary | ICD-10-CM | POA: Diagnosis not present

## 2022-12-26 DIAGNOSIS — B9689 Other specified bacterial agents as the cause of diseases classified elsewhere: Secondary | ICD-10-CM | POA: Diagnosis not present

## 2022-12-27 DIAGNOSIS — R69 Illness, unspecified: Secondary | ICD-10-CM | POA: Diagnosis not present

## 2022-12-28 DIAGNOSIS — R69 Illness, unspecified: Secondary | ICD-10-CM | POA: Diagnosis not present

## 2023-01-11 DIAGNOSIS — Z08 Encounter for follow-up examination after completed treatment for malignant neoplasm: Secondary | ICD-10-CM | POA: Diagnosis not present

## 2023-01-11 DIAGNOSIS — D225 Melanocytic nevi of trunk: Secondary | ICD-10-CM | POA: Diagnosis not present

## 2023-01-11 DIAGNOSIS — R69 Illness, unspecified: Secondary | ICD-10-CM | POA: Diagnosis not present

## 2023-01-11 DIAGNOSIS — L821 Other seborrheic keratosis: Secondary | ICD-10-CM | POA: Diagnosis not present

## 2023-01-11 DIAGNOSIS — Z1283 Encounter for screening for malignant neoplasm of skin: Secondary | ICD-10-CM | POA: Diagnosis not present

## 2023-01-11 DIAGNOSIS — Z85820 Personal history of malignant melanoma of skin: Secondary | ICD-10-CM | POA: Diagnosis not present

## 2023-02-01 DIAGNOSIS — R69 Illness, unspecified: Secondary | ICD-10-CM | POA: Diagnosis not present

## 2023-02-07 ENCOUNTER — Ambulatory Visit: Payer: Medicare HMO

## 2023-02-07 DIAGNOSIS — I495 Sick sinus syndrome: Secondary | ICD-10-CM

## 2023-02-07 LAB — CUP PACEART REMOTE DEVICE CHECK
Battery Remaining Longevity: 124 mo
Battery Voltage: 3.02 V
Brady Statistic AP VP Percent: 0.04 %
Brady Statistic AP VS Percent: 98.13 %
Brady Statistic AS VP Percent: 0 %
Brady Statistic AS VS Percent: 1.83 %
Brady Statistic RA Percent Paced: 98.22 %
Brady Statistic RV Percent Paced: 0.05 %
Date Time Interrogation Session: 20240820070859
Implantable Lead Connection Status: 753985
Implantable Lead Connection Status: 753985
Implantable Lead Implant Date: 20210524
Implantable Lead Implant Date: 20210524
Implantable Lead Location: 753859
Implantable Lead Location: 753860
Implantable Lead Model: 5076
Implantable Lead Model: 5076
Implantable Pulse Generator Implant Date: 20210524
Lead Channel Impedance Value: 285 Ohm
Lead Channel Impedance Value: 418 Ohm
Lead Channel Impedance Value: 608 Ohm
Lead Channel Impedance Value: 836 Ohm
Lead Channel Pacing Threshold Amplitude: 0.625 V
Lead Channel Pacing Threshold Amplitude: 0.625 V
Lead Channel Pacing Threshold Pulse Width: 0.4 ms
Lead Channel Pacing Threshold Pulse Width: 0.4 ms
Lead Channel Sensing Intrinsic Amplitude: 2.375 mV
Lead Channel Sensing Intrinsic Amplitude: 2.375 mV
Lead Channel Sensing Intrinsic Amplitude: 7.5 mV
Lead Channel Sensing Intrinsic Amplitude: 7.5 mV
Lead Channel Setting Pacing Amplitude: 1.5 V
Lead Channel Setting Pacing Amplitude: 2.5 V
Lead Channel Setting Pacing Pulse Width: 0.4 ms
Lead Channel Setting Sensing Sensitivity: 1.2 mV
Zone Setting Status: 755011
Zone Setting Status: 755011

## 2023-02-17 NOTE — Progress Notes (Signed)
Remote pacemaker transmission.   

## 2023-03-21 DIAGNOSIS — M545 Low back pain, unspecified: Secondary | ICD-10-CM | POA: Diagnosis not present

## 2023-05-09 ENCOUNTER — Ambulatory Visit (INDEPENDENT_AMBULATORY_CARE_PROVIDER_SITE_OTHER): Payer: Medicare HMO

## 2023-05-09 DIAGNOSIS — I495 Sick sinus syndrome: Secondary | ICD-10-CM

## 2023-05-09 LAB — CUP PACEART REMOTE DEVICE CHECK
Battery Remaining Longevity: 122 mo
Battery Voltage: 3.01 V
Brady Statistic AP VP Percent: 0.05 %
Brady Statistic AP VS Percent: 99.37 %
Brady Statistic AS VP Percent: 0 %
Brady Statistic AS VS Percent: 0.58 %
Brady Statistic RA Percent Paced: 99.44 %
Brady Statistic RV Percent Paced: 0.05 %
Date Time Interrogation Session: 20241119014935
Implantable Lead Connection Status: 753985
Implantable Lead Connection Status: 753985
Implantable Lead Implant Date: 20210524
Implantable Lead Implant Date: 20210524
Implantable Lead Location: 753859
Implantable Lead Location: 753860
Implantable Lead Model: 5076
Implantable Lead Model: 5076
Implantable Pulse Generator Implant Date: 20210524
Lead Channel Impedance Value: 323 Ohm
Lead Channel Impedance Value: 456 Ohm
Lead Channel Impedance Value: 646 Ohm
Lead Channel Impedance Value: 855 Ohm
Lead Channel Pacing Threshold Amplitude: 0.375 V
Lead Channel Pacing Threshold Amplitude: 0.625 V
Lead Channel Pacing Threshold Pulse Width: 0.4 ms
Lead Channel Pacing Threshold Pulse Width: 0.4 ms
Lead Channel Sensing Intrinsic Amplitude: 3 mV
Lead Channel Sensing Intrinsic Amplitude: 3 mV
Lead Channel Sensing Intrinsic Amplitude: 8.5 mV
Lead Channel Sensing Intrinsic Amplitude: 8.5 mV
Lead Channel Setting Pacing Amplitude: 1.5 V
Lead Channel Setting Pacing Amplitude: 2.5 V
Lead Channel Setting Pacing Pulse Width: 0.4 ms
Lead Channel Setting Sensing Sensitivity: 1.2 mV
Zone Setting Status: 755011
Zone Setting Status: 755011

## 2023-05-09 NOTE — Progress Notes (Signed)
  Cardiology Office Note:  .   Date:  05/09/2023  ID:  Victor Stark, DOB 1940/06/29, MRN [id number] PCP: Daisy Floro, MD  [facility] Providers Cardiologist:  Wynonia Hazard > pending Dr Clifton James EP: Dr. Graciela Husbands  History of Present Illness: .   Victor Stark is a 82 y.o. male w/PMHx of symptomatic bradycardia w/PPM, CKD (III), HTN, PVCs, vertigo  He saw Dr. Graciela Husbands 05/18/22, doing well with minimal orthostatic symptoms, no changes made, discussed stopping hydrochlorothiazide if worse.  He saw Dr. Eldridge Dace 10/03/22, golfing, doing well  11/28/22 BUN/Creat 37/1.78 K+ 5.1  Today's visit is scheduled as an annual visit  ROS:   He is doing very well Continues to golf regularly, denies any exertional intolerances No CP, palpitations or cardiac awareness No SOB, DOE No near syncope or syncope Denies dizziness Labs are done with his PMD  Device information MDT dual chamber PPM implanted 11/11/19   Studies Reviewed: Marland Kitchen    EKG done today and reviewed by myself:  APaced, Vsensed, 83bpm,  RBBB  DEVICE interrogation done today and reviewed by myself Battery and lead measurements are good AP >99% VP <1% Underlying SB 40's No arrhythmias   02/01/2019: stress myoview Nuclear stress EF: 62%. The study is normal. This is a low risk study. The left ventricular ejection fraction is normal (55-65%).   Normal resting and stress perfusion. No ischemia or infarction EF 62%   Risk Assessment/Calculations:    Physical Exam:   VS:  There were no vitals taken for this visit.   Wt Readings from Last 3 Encounters:  10/14/22 170 lb (77.1 kg)  10/03/22 169 lb 9.6 oz (76.9 kg)  05/18/22 170 lb 9.6 oz (77.4 kg)    GEN: Well nourished, well developed in no acute distress NECK: No JVD; No carotid bruits CARDIAC: RRR, no murmurs, rubs, gallops RESPIRATORY:   CTA b/l without rales, wheezing or rhonchi  ABDOMEN: Soft, non-tender, non-distended EXTREMITIES:  No edema; No  deformity   PPM site: is stable, no thinning, fluctuation, tethering  ASSESSMENT AND PLAN: .    PPM Intact function No programming changes made  HTN Looks great  PVCs None noted during interrogation or on his EKG today     Dispo: remotes as usual, in clinic again in a year, sooner if needed.    Signed, Sheilah Pigeon, PA-C

## 2023-05-15 ENCOUNTER — Ambulatory Visit: Payer: Medicare HMO | Attending: Physician Assistant | Admitting: Physician Assistant

## 2023-05-15 ENCOUNTER — Encounter: Payer: Self-pay | Admitting: Physician Assistant

## 2023-05-15 VITALS — BP 118/72 | HR 83 | Ht 69.0 in | Wt 170.4 lb

## 2023-05-15 DIAGNOSIS — Z950 Presence of cardiac pacemaker: Secondary | ICD-10-CM

## 2023-05-15 DIAGNOSIS — I495 Sick sinus syndrome: Secondary | ICD-10-CM

## 2023-05-15 DIAGNOSIS — I10 Essential (primary) hypertension: Secondary | ICD-10-CM

## 2023-05-15 LAB — CUP PACEART INCLINIC DEVICE CHECK
Battery Remaining Longevity: 123 mo
Battery Voltage: 3.01 V
Brady Statistic AP VP Percent: 0.04 %
Brady Statistic AP VS Percent: 98.06 %
Brady Statistic AS VP Percent: 0 %
Brady Statistic AS VS Percent: 1.9 %
Brady Statistic RA Percent Paced: 99 %
Brady Statistic RV Percent Paced: 0.04 %
Date Time Interrogation Session: 20241125085527
Implantable Lead Connection Status: 753985
Implantable Lead Connection Status: 753985
Implantable Lead Implant Date: 20210524
Implantable Lead Implant Date: 20210524
Implantable Lead Location: 753859
Implantable Lead Location: 753860
Implantable Lead Model: 5076
Implantable Lead Model: 5076
Implantable Pulse Generator Implant Date: 20210524
Lead Channel Impedance Value: 304 Ohm
Lead Channel Impedance Value: 494 Ohm
Lead Channel Impedance Value: 684 Ohm
Lead Channel Impedance Value: 874 Ohm
Lead Channel Pacing Threshold Amplitude: 0.5 V
Lead Channel Pacing Threshold Amplitude: 0.625 V
Lead Channel Pacing Threshold Pulse Width: 0.4 ms
Lead Channel Pacing Threshold Pulse Width: 0.4 ms
Lead Channel Sensing Intrinsic Amplitude: 2.5 mV
Lead Channel Sensing Intrinsic Amplitude: 2.625 mV
Lead Channel Sensing Intrinsic Amplitude: 6.875 mV
Lead Channel Sensing Intrinsic Amplitude: 8.25 mV
Lead Channel Setting Pacing Amplitude: 1.5 V
Lead Channel Setting Pacing Amplitude: 2.5 V
Lead Channel Setting Pacing Pulse Width: 0.4 ms
Lead Channel Setting Sensing Sensitivity: 1.2 mV
Zone Setting Status: 755011
Zone Setting Status: 755011

## 2023-05-15 NOTE — Patient Instructions (Signed)
Medication Instructions:  Your physician recommends that you continue on your current medications as directed. Please refer to the Current Medication list given to you today.  *If you need a refill on your cardiac medications before your next appointment, please call your pharmacy*   Follow-Up: At [facility], you and your health needs are our priority.  As part of our continuing mission to provide you with exceptional heart care, we have created designated Provider Care Teams.  These Care Teams include your primary Cardiologist (physician) and Advanced Practice Providers (APPs -  Physician Assistants and Nurse Practitioners) who all work together to provide you with the care you need, when you need it.  Your next appointment:   1 year  Provider:   You may see Dr. Graciela Husbands or one of the following Advanced Practice Providers on your designated Care Team:   Francis Dowse, [place] [address] [place], [place] Sherie Don, NP Canary Brim, NP

## 2023-05-22 DIAGNOSIS — R0602 Shortness of breath: Secondary | ICD-10-CM | POA: Diagnosis not present

## 2023-05-22 DIAGNOSIS — Z6826 Body mass index (BMI) 26.0-26.9, adult: Secondary | ICD-10-CM | POA: Diagnosis not present

## 2023-05-24 DIAGNOSIS — H3581 Retinal edema: Secondary | ICD-10-CM | POA: Diagnosis not present

## 2023-05-24 DIAGNOSIS — H35373 Puckering of macula, bilateral: Secondary | ICD-10-CM | POA: Diagnosis not present

## 2023-05-24 DIAGNOSIS — H43391 Other vitreous opacities, right eye: Secondary | ICD-10-CM | POA: Diagnosis not present

## 2023-05-31 DIAGNOSIS — N1832 Chronic kidney disease, stage 3b: Secondary | ICD-10-CM | POA: Diagnosis not present

## 2023-06-02 DIAGNOSIS — I129 Hypertensive chronic kidney disease with stage 1 through stage 4 chronic kidney disease, or unspecified chronic kidney disease: Secondary | ICD-10-CM | POA: Diagnosis not present

## 2023-06-02 DIAGNOSIS — M1990 Unspecified osteoarthritis, unspecified site: Secondary | ICD-10-CM | POA: Diagnosis not present

## 2023-06-02 DIAGNOSIS — N189 Chronic kidney disease, unspecified: Secondary | ICD-10-CM | POA: Diagnosis not present

## 2023-06-02 DIAGNOSIS — D631 Anemia in chronic kidney disease: Secondary | ICD-10-CM | POA: Diagnosis not present

## 2023-06-02 DIAGNOSIS — N1832 Chronic kidney disease, stage 3b: Secondary | ICD-10-CM | POA: Diagnosis not present

## 2023-06-05 NOTE — Progress Notes (Signed)
Remote pacemaker transmission.   

## 2023-07-13 DIAGNOSIS — R519 Headache, unspecified: Secondary | ICD-10-CM | POA: Diagnosis not present

## 2023-07-13 DIAGNOSIS — E1122 Type 2 diabetes mellitus with diabetic chronic kidney disease: Secondary | ICD-10-CM | POA: Diagnosis not present

## 2023-07-13 DIAGNOSIS — N1832 Chronic kidney disease, stage 3b: Secondary | ICD-10-CM | POA: Diagnosis not present

## 2023-07-13 DIAGNOSIS — I5032 Chronic diastolic (congestive) heart failure: Secondary | ICD-10-CM | POA: Diagnosis not present

## 2023-07-26 DIAGNOSIS — Z1283 Encounter for screening for malignant neoplasm of skin: Secondary | ICD-10-CM | POA: Diagnosis not present

## 2023-07-26 DIAGNOSIS — Z08 Encounter for follow-up examination after completed treatment for malignant neoplasm: Secondary | ICD-10-CM | POA: Diagnosis not present

## 2023-07-26 DIAGNOSIS — D225 Melanocytic nevi of trunk: Secondary | ICD-10-CM | POA: Diagnosis not present

## 2023-07-26 DIAGNOSIS — Z85820 Personal history of malignant melanoma of skin: Secondary | ICD-10-CM | POA: Diagnosis not present

## 2023-08-01 ENCOUNTER — Other Ambulatory Visit (HOSPITAL_COMMUNITY): Payer: Self-pay | Admitting: Family Medicine

## 2023-08-01 DIAGNOSIS — R519 Headache, unspecified: Secondary | ICD-10-CM

## 2023-08-01 DIAGNOSIS — N1832 Chronic kidney disease, stage 3b: Secondary | ICD-10-CM | POA: Diagnosis not present

## 2023-08-01 DIAGNOSIS — R030 Elevated blood-pressure reading, without diagnosis of hypertension: Secondary | ICD-10-CM | POA: Diagnosis not present

## 2023-08-01 DIAGNOSIS — M25552 Pain in left hip: Secondary | ICD-10-CM | POA: Diagnosis not present

## 2023-08-04 ENCOUNTER — Ambulatory Visit (HOSPITAL_COMMUNITY)
Admission: RE | Admit: 2023-08-04 | Discharge: 2023-08-04 | Disposition: A | Discharge status: Home / Self Care | Payer: Medicare Other | Source: Ambulatory Visit | Attending: Family Medicine | Admitting: Family Medicine

## 2023-08-04 DIAGNOSIS — R519 Headache, unspecified: Secondary | ICD-10-CM | POA: Diagnosis not present

## 2023-08-04 DIAGNOSIS — I6782 Cerebral ischemia: Secondary | ICD-10-CM | POA: Diagnosis not present

## 2023-08-04 DIAGNOSIS — I672 Cerebral atherosclerosis: Secondary | ICD-10-CM | POA: Diagnosis not present

## 2023-08-08 ENCOUNTER — Ambulatory Visit (INDEPENDENT_AMBULATORY_CARE_PROVIDER_SITE_OTHER): Payer: Medicare HMO

## 2023-08-08 DIAGNOSIS — I495 Sick sinus syndrome: Secondary | ICD-10-CM | POA: Diagnosis not present

## 2023-08-09 LAB — CUP PACEART REMOTE DEVICE CHECK
Battery Remaining Longevity: 119 mo
Battery Voltage: 3.01 V
Brady Statistic AP VP Percent: 0.04 %
Brady Statistic AP VS Percent: 99.46 %
Brady Statistic AS VP Percent: 0 %
Brady Statistic AS VS Percent: 0.49 %
Brady Statistic RA Percent Paced: 99.51 %
Brady Statistic RV Percent Paced: 0.04 %
Date Time Interrogation Session: 20250218032730
Implantable Lead Connection Status: 753985
Implantable Lead Connection Status: 753985
Implantable Lead Implant Date: 20210524
Implantable Lead Implant Date: 20210524
Implantable Lead Location: 753859
Implantable Lead Location: 753860
Implantable Lead Model: 5076
Implantable Lead Model: 5076
Implantable Pulse Generator Implant Date: 20210524
Lead Channel Impedance Value: 304 Ohm
Lead Channel Impedance Value: 456 Ohm
Lead Channel Impedance Value: 722 Ohm
Lead Channel Impedance Value: 912 Ohm
Lead Channel Pacing Threshold Amplitude: 0.625 V
Lead Channel Pacing Threshold Amplitude: 0.625 V
Lead Channel Pacing Threshold Pulse Width: 0.4 ms
Lead Channel Pacing Threshold Pulse Width: 0.4 ms
Lead Channel Sensing Intrinsic Amplitude: 3.125 mV
Lead Channel Sensing Intrinsic Amplitude: 3.125 mV
Lead Channel Sensing Intrinsic Amplitude: 7.75 mV
Lead Channel Sensing Intrinsic Amplitude: 7.75 mV
Lead Channel Setting Pacing Amplitude: 1.5 V
Lead Channel Setting Pacing Amplitude: 2.5 V
Lead Channel Setting Pacing Pulse Width: 0.4 ms
Lead Channel Setting Sensing Sensitivity: 1.2 mV
Zone Setting Status: 755011
Zone Setting Status: 755011

## 2023-08-10 DIAGNOSIS — R519 Headache, unspecified: Secondary | ICD-10-CM | POA: Diagnosis not present

## 2023-08-23 ENCOUNTER — Encounter: Payer: Self-pay | Admitting: Neurology

## 2023-08-23 ENCOUNTER — Ambulatory Visit: Admitting: Neurology

## 2023-08-23 VITALS — BP 120/75 | HR 67 | Ht 69.0 in | Wt 168.2 lb

## 2023-08-23 DIAGNOSIS — Z9189 Other specified personal risk factors, not elsewhere classified: Secondary | ICD-10-CM

## 2023-08-23 DIAGNOSIS — R519 Headache, unspecified: Secondary | ICD-10-CM

## 2023-08-23 DIAGNOSIS — R351 Nocturia: Secondary | ICD-10-CM

## 2023-08-23 DIAGNOSIS — Z950 Presence of cardiac pacemaker: Secondary | ICD-10-CM

## 2023-08-23 DIAGNOSIS — I495 Sick sinus syndrome: Secondary | ICD-10-CM

## 2023-08-23 DIAGNOSIS — R0683 Snoring: Secondary | ICD-10-CM | POA: Diagnosis not present

## 2023-08-23 DIAGNOSIS — G479 Sleep disorder, unspecified: Secondary | ICD-10-CM

## 2023-08-23 NOTE — Patient Instructions (Addendum)
 It was nice to meet you today!   Here is what we discussed today:   Your headaches do not sound like you are having migraines.  Your recent head CT scan did not show any sinus infection.  Please make sure you have a regular eye examination.  Hydrate well with water, we recommend 3-4 bowel per day, 16.9 ounce size each.  Please limit caffeine to up to 2 servings per day.  Based on your symptoms and your exam I believe you are at risk for obstructive sleep apnea (aka OSA). We should proceed with a sleep study to determine whether you do or do not have OSA and how severe it is. Even, if you have mild OSA, I may want you to consider treatment with CPAP, as treatment of even borderline or mild sleep apnea can result and improvement of symptoms such as sleep disruption, daytime sleepiness, nighttime bathroom breaks, restless leg symptoms, improvement of headache syndromes, even improved mood disorder.   Please remember, the long-term risks and ramifications of untreated moderate to severe obstructive sleep apnea may include (but are not limited to): increased risk for cardiovascular disease, including congestive heart failure, stroke, difficult to control hypertension, treatment resistant obesity, arrhythmias, especially irregular heartbeat commonly known as A. Fib. (atrial fibrillation); even type 2 diabetes has been linked to untreated OSA.   Other correlations that untreated obstructive sleep apnea include macular edema which is swelling of the retina in the eyes, droopy eyelid syndrome, and elevated hemoglobin and hematocrit levels (often referred to as polycythemia).  Sleep apnea can cause disruption of sleep and sleep deprivation in most cases, which, in turn, can cause recurrent headaches, problems with memory, mood, concentration, focus, and vigilance. Most people with untreated sleep apnea report excessive daytime sleepiness, which can affect their ability to drive. Please do not drive or use  heavy equipment or machinery, if you feel sleepy! Patients with sleep apnea can also develop difficulty initiating and maintaining sleep (aka insomnia).

## 2023-08-23 NOTE — Progress Notes (Signed)
 Subjective:    Patient ID: Victor Stark is a 83 y.o. male.  HPI    Huston Foley, MD, PhD [facility] [address], [address] [address] [place], [place] [address]   Dear Dr. Tenny Craw,  I saw your patient, Victor Stark, upon your kind request in my neurologic clinic today for initial consultation of his recurrent headaches.  The patient is accompanied by his wife today.  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  Headaches typically start in the evening around 9 PM or earlier than that and he does not wake up in the middle of the night with a headache but does have headaches in the mornings.  He has nocturia about once or twice per average night.  Upon further questioning, he does have a history of headaches in the past but they went away he recalls.  He has never had migraine headaches.  His headaches are bifrontal, right side a little worse than left and often steady or constant, not typically throbbing, no nausea vomiting associated but does have some light sensitivity.  Headache severity is not extreme, he feels that they are better but he does take daily Tylenol at this point, 2 to 4 pills, sometimes up to 6 pills/day.  He cannot take NSAIDs because of his kidney function.  He does not sleep well and has been on trazodone for years but feels asleep quite well currently.  He takes 50 mg of trazodone each night.  He goes to bed around 10 and likes to read for about half an hour.  Rise time is between 6 and 7.  He drinks caffeine in the form of coffee, 1-1/2 cups/day.  He drinks about 4 to 6 glasses of water per day,  taller glasses.  He does not drink soda daily.  Quit smoking some 20 years ago and quit smoking cigars about 5 years ago.  He does not drink any alcohol.  They do not have a TV in their bedroom.  He snores a little, occasionally.  His adopted son has sleep apnea.  When he took Flonase and the antibiotic recently, he did not think it helped his headaches. I reviewed your telemedicine office note from 08/10/2023.  He reported late evening and morning headaches at the time he was advised to start Flonase and azithromycin. He had a head CT without contrast on 08/04/2023 and I reviewed the results:   Impression: No CT evidence of acute intracranial abnormality.   Moderate chronic microvascular ischemic changes and mild parenchymal volume loss.   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   In addition, I personally and independently reviewed images through the PACS system..  No obvious sinus issues were seen, generalized atrophy was noted with ex vacuo dilatation of the lateral ventricles.  He had a brain MRI without contrast on 06/17/2013 and I reviewed the results:   IMPRESSION:  Abnormal MRI scan of the brain showing mild changes  of age-appropriate chronic microvascular ischemia and  supratentorial cerebral atrophy.   In addition, I personally and independently reviewed images through the PACS system.  He had seen my former colleague, Dr. Hosie Poisson this clinic over 10 years ago.  I reviewed the  office visit note from 06/06/2013.  He reported bifrontal headaches at the time.  He was found to have sinus headache versus tension type headache.  No specific medication was started at the time.  I reviewed your office visit note from 07/13/2023, he saw ENT break, NP at the time.  He had blood work through your office on 05/22/2023 and I reviewed the results: CMP showed BUN elevated at 40, creatinine elevated at 1.93, sodium and potassium normal, AST 17 ALT 14.  TSH was 3.96.  CBC  with differential and platelets showed hide hemoglobin at 11.4 hematocrit below normal at 34.3.  Ferritin level was 171.6, within range.  Iron studies including iron binding capacity, iron saturation and transferrin within normal range.  Vitamin B12 was on the low normal and at 320.  Vitamin D was 43.3.   He was given a prescription for doxycycline at that time.   He has never had a sleep study.  He does not sleep very well.  Epworth sleepiness score is 3 out of 24 today, fatigue severity score is 19 out of 63.  Of note, he has seen ophthalmology not too long ago on 05/24/2023.  He has puckering of the macula bilaterally and retinal edema.  His Past Medical History Is Significant For: Past Medical History:  Diagnosis Date   AAA (abdominal aortic aneurysm) (HCC) 10/21/2013   3.1 in 1/14.    Actinic keratoses    Dr. Charlton Haws   Aortic sclerosis 01/14/2016   Bradycardia 10/29/2014   Chronic kidney disease, stage III (moderate) (HCC)    Dr. Nelda Severe   Cough 03/22/2013   Followed in Pulmonary clinic/  [facility]/ [facility] - try off acei 03/22/2013  > improved 04/19/2013     Diverticulitis 2012   Ganglion cyst of wrist    left- Dr. Teressa Senter   Headache(784.0) 06/06/2013   HTN (hypertension) 04/19/2013   D/c ACEi 03/23/13 due to cough     Hx of cardiovascular stress test    LexiScan Myoview (9/15):  Normal study, EF 49% (visually looks normal; quantifies mildly decreased).  // Myoview 01/2019: EF 62, no ischemia or scar, low risk    Hyperlipidemia    Hypertension    Melanoma (HCC) 2013   Dr. Charlton Haws   Prostate cancer [facility])    PVC's (premature ventricular contractions) 10/21/2013   Monitor in 2014 revealed normal sinus rhythm with PVCs    Solitary pulmonary nodule 03/23/2013   First detected 07/04/12 - See CT and f/u 09/12/12 improved     Spondylosis    Dr. Murray Hodgkins    His Past Surgical History Is Significant For: Past Surgical History:  Procedure Laterality Date   LUMBAR DISC SURGERY     PACEMAKER  IMPLANT N/A 11/11/2019   Procedure: PACEMAKER IMPLANT;  Surgeon: Duke Salvia, MD;  Location: [facility] INVASIVE CV LAB;  Service: Cardiovascular;  Laterality: N/A;   PROSTATECTOMY     ROTATOR CUFF REPAIR  Jan 2014   SHOULDER SURGERY      His Family History Is Significant For: Family History  Problem Relation Age of Onset   Heart attack Father    Asthma Sister    Heart attack Daughter 56    His Social History Is Significant For: Social History   Socioeconomic History   Marital status: Married    Spouse name: Kennon Rounds   Number of children: 2   Years of education: college   Highest education level: Not on file  Occupational History   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    Employer: RETIRED  Tobacco Use   Smoking status: Former    Current packs/day: 0.00    Average packs/day: 0.8 packs/day for 15.0 years (11.3 ttl pk-yrs)    Types: Cigarettes, Cigars    Start date: 06/20/1977    Quit date: 06/20/1992    Years since quitting: 31.1   Smokeless tobacco: Never  Vaping Use   Vaping status: Never Used  Substance and Sexual Activity   Alcohol use: Yes    Comment: rare   Drug use: Never   Sexual activity: Not on file  Other Topics Concern   Not on file  Social History Narrative   Patient is married([name])Patient has two children.Patient is retired from [employer].Patient drinks two servings of coffee and tea daily.   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, Nurse, mental health    Social Drivers of Corporate investment banker Strain: Not on [employer] Insecurity: Not on file  Transportation Needs: Not on file  Physical Activity: Not on file  Stress: Not on file  Social Connections: Not on file    His Allergies Are:  Allergies  Allergen Reactions   Ace Inhibitors Cough and Swelling    Other Reaction(s): dry cough, Other (See Comments)  cough, cough, cough  Dry cough, Dry cough, Dry cough   Atorvastatin Other (See Comments)    fatigue  Other Reaction(s): abdominal pain,  fatigue  fatigue, fatigue, fatigue   Etodolac Hives and Swelling    Other Reaction(s): hives, hives,swelling   Hydrocodone-Acetaminophen Hives and Swelling    Other Reaction(s): swelling/hives   Lipitor [Atorvastatin Calcium] Other (See Comments)    Muscle pain/fatigue   Lisinopril Other (See Comments)    Dry cough    Pravastatin Other (See Comments)    Muscle aches  Other Reaction(s): muscle aches, Other (See Comments)  Muscle aches, Muscle aches, Muscle aches   Vicodin [Hydrocodone-Acetaminophen] Hives and Swelling   Dutasteride Other (See Comments)    Lack of therapeutic effect  Other Reaction(s): intolerant  Lack of therapeutic effect, Lack of therapeutic effect, Lack of therapeutic effect   Hydrocodone     Other Reaction(s): Not available   Nsaids     Other reaction(s): CKD  Other Reaction(s): ckd   Amoxicillin Rash    Other Reaction(s): hives, Not available  :   His Current Medications Are:  Outpatient Encounter Medications as of 08/23/2023  Medication Sig   acetaminophen (TYLENOL) 325 MG tablet Take 325-650 mg by mouth every 6 (six) hours as needed (pain.).    allopurinol (ZYLOPRIM) 100 MG tablet Take 50 mg by mouth daily.   Cholecalciferol (VITAMIN D) 2000 UNITS CAPS Take 2,000 Units by mouth daily.    Coenzyme Q10 200 MG capsule Take 200 mg by mouth daily.   fluticasone (FLONASE) 50 MCG/ACT nasal spray Place 2 sprays into both nostrils daily.   meclizine (ANTIVERT) 25 MG tablet Take 25 mg by mouth 3 (three) times daily as needed for dizziness.   Multiple Vitamin (MULTIVITAMIN WITH MINERALS) TABS Take 1 tablet by mouth daily. Centrum Silver   Omega-3 Fatty Acids (OMEGA-3 EPA FISH OIL PO) Take 950 mg by mouth daily.   prednisoLONE acetate (PRED FORTE) 1 % ophthalmic suspension Place 1 drop into the left eye 4 (four) times daily.   rosuvastatin (CRESTOR) 20 MG tablet Take 20 mg by mouth at bedtime.    telmisartan-hydrochlorothiazide (MICARDIS HCT) 80-12.5 MG tablet  Take 1 tablet by mouth daily.   traZODone (  DESYREL) 50 MG tablet Take 50 mg by mouth at bedtime.   valACYclovir (VALTREX) 500 MG tablet Take 500 mg by mouth as needed (BREAKOUTS).    [DISCONTINUED] telmisartan (MICARDIS) 40 MG tablet Take 40 mg by mouth daily.   No facility-administered encounter medications on file as of 08/23/2023.  :   Review of Systems:  Out of a complete 14 point review of systems, all are reviewed and negative with the exception of these symptoms as listed below:   Review of Systems  Neurological:        Referral for unrelenting headaches.  ESS 3  FSS 19. Headache lat nightm and early am.  Will go away after awhile.  Snores slight, does not feel reasted. Wakes 1-2 nightly. Takes tylenol as needed.     Objective:  Neurological Exam  Physical Exam Physical Examination:   Vitals:   08/23/23 0818  BP: 120/75  Pulse: 67    General Examination: The patient is a very pleasant 83 y.o. male in no acute distress. He appears well-developed and well-nourished and well groomed.   HEENT: Normocephalic, atraumatic, pupils are equal, round and reactive to light, extraocular tracking is good without limitation to gaze excursion or nystagmus noted.  No photophobia.  Status post cataract repairs.  Hearing is grossly intact. Face is symmetric with normal facial animation. Speech is clear with no dysarthria noted. There is no hypophonia. There is no lip, neck/head, jaw or voice tremor. Neck is supple with full range of passive and active motion. There are no carotid bruits on auscultation. Oropharynx exam reveals: mild mouth dryness, adequate dental hygiene and moderate airway crowding, due to small airway entry and long uvula.  Tonsils appear to be absent.  Tongue protrudes centrally and palate elevates symmetrically.  Neck circumference 16-1/2 inches.  Chest: Clear to auscultation without wheezing, rhonchi or crackles noted.  Heart: S1+S2+0, regular and normal without murmurs,  rubs or gallops noted.   Abdomen: Soft, non-tender and non-distended.  Extremities: There is no pitting edema in the distal lower extremities bilaterally.   Skin: Warm and dry without trophic changes noted.   Musculoskeletal: exam reveals no obvious joint deformities.   Neurologically:  Mental status: The patient is awake, alert and able to provide his history but history is supplemented by his wife. His immediate and remote memory, attention, language skills and fund of knowledge are fair.  There is no evidence of aphasia, agnosia, apraxia or anomia. Speech is clear with normal prosody and enunciation. Thought process is linear. Mood is normal and affect is normal.  Cranial nerves II - XII are as described above under HEENT exam.  Motor exam: Normal bulk, strength and tone is noted. There is no obvious action or resting tremor.  Fine motor skills and coordination: grossly intact.  Cerebellar testing: No dysmetria or intention tremor. There is no truncal or gait ataxia.  Sensory exam: intact to light touch in the upper and lower extremities.  Reflexes are 1+ throughout, toes are downgoing bilaterally. Gait, station and balance: He stands without difficulty, pushes himself up, posture is age-appropriate, he walks slightly slowly and cautiously, denies any lightheadedness upon standing, walks without a walking cane, has preserved arm swing.  Romberg and tandem walk not tested due to safety concerns.  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  Headache description is not migraine-like, he does not currently have sinus infection and treatment for allergies and sinus infection did not recently help.  He does not have a typical headache distribution in keeping with tension headache either.  He has not started any new medication recently and had some dose changes in his telmisartan.  Underlying sleep disordered breathing is a distinct possibility.  I would like to proceed with a sleep study.  He had a recent head CT scan and a prior brain MRI. A laboratory attended sleep study is typically considered "gold standard" for evaluation of sleep disordered breathing.   I had a long chat with the patient and his wife about the possible underlying diagnosis of obstructive sleep apnea.  We talked about its prognosis and treatment options.  He has started seeing a retina specialist.  He has a history of left retinal detachment and had surgery.  Reportedly he has had retinal edema.  I explained, in particular, the risks and ramifications of untreated moderate to severe OSA, especially with respect to developing cardiovascular disease down the road, including congestive heart failure (CHF), difficult to treat hypertension, cardiac arrhythmias (particularly A-fib), neurovascular complications including TIA, stroke and dementia.   He is advised to consider AutoPap therapy or CPAP therapy should he have obstructive sleep apnea.  He is discouraged from using Tylenol daily and only use it as needed.  His headaches are not severe at this time and neurological exam is nonfocal.  We will plan to follow-up after sleep testing.  This was an extended visit of over 60 minutes with copious record review involved including imaging review personally, considerable counseling and coordination of care.   Thank you very much for allowing me to participate in the care of this nice patient. If I can be of any further assistance to you please do not  hesitate to call me at [phone].  Sincerely,   Huston Foley, MD, PhD

## 2023-09-04 ENCOUNTER — Telehealth: Payer: Self-pay | Admitting: Neurology

## 2023-09-04 NOTE — Telephone Encounter (Signed)
 Patient returned my call  [place] [employer] medicare no auth req   He is scheduled at [facility] for 10/03/2023 at 8 pm.  Mailed packet to the patient.

## 2023-09-04 NOTE — Telephone Encounter (Signed)
 LVM for pt to call back to schedule   BCBS medicare no auth req via website

## 2023-09-05 ENCOUNTER — Encounter: Payer: Self-pay | Admitting: Internal Medicine

## 2023-09-14 NOTE — Progress Notes (Signed)
 Remote pacemaker transmission.

## 2023-09-14 NOTE — Addendum Note (Signed)
 Addended by: Elease Etienne A on: 09/14/2023 09:55 AM   Modules accepted: Orders

## 2023-09-28 ENCOUNTER — Encounter: Payer: Self-pay | Admitting: Cardiovascular Disease

## 2023-09-28 ENCOUNTER — Ambulatory Visit: Payer: Medicare HMO | Attending: Cardiovascular Disease | Admitting: Cardiovascular Disease

## 2023-09-28 VITALS — BP 112/64 | HR 74 | Resp 16 | Ht 69.0 in | Wt 171.0 lb

## 2023-09-28 DIAGNOSIS — R5383 Other fatigue: Secondary | ICD-10-CM

## 2023-09-28 DIAGNOSIS — I714 Abdominal aortic aneurysm, without rupture, unspecified: Secondary | ICD-10-CM

## 2023-09-28 DIAGNOSIS — I495 Sick sinus syndrome: Secondary | ICD-10-CM

## 2023-09-28 NOTE — Progress Notes (Signed)
 Chief Complaint  Patient presents with   Follow-up    HTN, bradycardia    History of Present Illness: 83 yo male with history of HTN, HLD, prostate cancer, CKD, AAA, symptomatic bradycardia s/p pacemaker placement, vertigo here today for follow up. He has been followed in the past by Dr. Eldridge Dace. Stress myoview in August 2020 without ischemia. AAA followed in the [facility] office. Pacemaker implanted in 2021 for symptomatic bradycardia, followed by Dr. Graciela Husbands.   He is here today for follow up. The patient denies any chest pain, dyspnea, palpitations, lower extremity edema, orthopnea, PND, dizziness, near syncope or syncope. He has fatigue. He is playing golf.  Primary Care Physician: Daisy Floro, MD   Past Medical History:  Diagnosis Date   AAA (abdominal aortic aneurysm) (HCC) 10/21/2013   3.1 in 1/14.    Actinic keratoses    Dr. Charlton Haws   Aortic sclerosis 01/14/2016   Bradycardia 10/29/2014   Chronic kidney disease, stage III (moderate) (HCC)    Dr. Nelda Severe   Cough 03/22/2013   Followed in Pulmonary clinic/ [facility]/ [facility] - try off acei 03/22/2013  > improved 04/19/2013     Diverticulitis 2012   Ganglion cyst of wrist    left- Dr. Teressa Senter   Headache(784.0) 06/06/2013   HTN (hypertension) 04/19/2013   D/c ACEi 03/23/13 due to cough     Hx of cardiovascular stress test    LexiScan Myoview (9/15):  Normal study, EF 49% (visually looks normal; quantifies mildly decreased).  // Myoview 01/2019: EF 62, no ischemia or scar, low risk    Hyperlipidemia    Hypertension    Melanoma (HCC) 2013   Dr. Charlton Haws   Prostate cancer [facility])    PVC's (premature ventricular contractions) 10/21/2013   Monitor in 2014 revealed normal sinus rhythm with PVCs    Solitary pulmonary nodule 03/23/2013   First detected 07/04/12 - See CT and f/u 09/12/12 improved     Spondylosis    Dr. Murray Hodgkins    Past Surgical History:  Procedure Laterality Date   LUMBAR DISC SURGERY     PACEMAKER IMPLANT N/A  11/11/2019   Procedure: PACEMAKER IMPLANT;  Surgeon: Duke Salvia, MD;  Location: [facility] INVASIVE CV LAB;  Service: Cardiovascular;  Laterality: N/A;   PROSTATECTOMY     ROTATOR CUFF REPAIR  Jan 2014   SHOULDER SURGERY      Current Outpatient Medications  Medication Sig Dispense Refill   acetaminophen (TYLENOL) 325 MG tablet Take 325-650 mg by mouth every 6 (six) hours as needed (pain.).      allopurinol (ZYLOPRIM) 100 MG tablet Take 50 mg by mouth daily.     Cholecalciferol (VITAMIN D) 2000 UNITS CAPS Take 2,000 Units by mouth daily.      Coenzyme Q10 200 MG capsule Take 200 mg by mouth daily.     fluticasone (FLONASE) 50 MCG/ACT nasal spray Place 2 sprays into both nostrils daily.     meclizine (ANTIVERT) 25 MG tablet Take 25 mg by mouth 3 (three) times daily as needed for dizziness.     Multiple Vitamin (MULTIVITAMIN WITH MINERALS) TABS Take 1 tablet by mouth daily. Centrum Silver     Omega-3 Fatty Acids (OMEGA-3 EPA FISH OIL PO) Take 950 mg by mouth daily.     prednisoLONE acetate (PRED FORTE) 1 % ophthalmic suspension Place 1 drop into the left eye 4 (four) times daily.     rosuvastatin (CRESTOR) 20 MG tablet Take 20 mg by mouth at bedtime.  telmisartan-hydrochlorothiazide (MICARDIS HCT) 80-12.5 MG tablet Take 1 tablet by mouth daily.     traZODone (DESYREL) 50 MG tablet Take 50 mg by mouth at bedtime.     valACYclovir (VALTREX) 500 MG tablet Take 500 mg by mouth as needed (BREAKOUTS).      No current facility-administered medications for this visit.    Allergies  Allergen Reactions   Ace Inhibitors Cough and Swelling    Other Reaction(s): dry cough, Other (See Comments)  cough, cough, cough  Dry cough, Dry cough, Dry cough   Atorvastatin Other (See Comments)    fatigue  Other Reaction(s): abdominal pain, fatigue  fatigue, fatigue, fatigue   Etodolac Hives and Swelling    Other Reaction(s): hives, hives,swelling   Hydrocodone-Acetaminophen Hives and Swelling    Other  Reaction(s): swelling/hives   Lipitor [Atorvastatin Calcium] Other (See Comments)    Muscle pain/fatigue   Lisinopril Other (See Comments)    Dry cough    Pravastatin Other (See Comments)    Muscle aches  Other Reaction(s): muscle aches, Other (See Comments)  Muscle aches, Muscle aches, Muscle aches   Vicodin [Hydrocodone-Acetaminophen] Hives and Swelling   Dutasteride Other (See Comments)    Lack of therapeutic effect  Other Reaction(s): intolerant  Lack of therapeutic effect, Lack of therapeutic effect, Lack of therapeutic effect   Hydrocodone     Other Reaction(s): Not available   Nsaids     Other reaction(s): CKD  Other Reaction(s): ckd   Amoxicillin Rash    Other Reaction(s): hives, Not available    Social History   Socioeconomic History   Marital status: Married    Spouse name: Kennon Rounds   Number of children: 2   Years of education: college   Highest education level: Not on file  Occupational History   Occupation: Health and safety inspector of Engineer, production: RETIRED  Tobacco Use   Smoking status: Former    Current packs/day: 0.00    Average packs/day: 0.8 packs/day for 15.0 years (11.3 ttl pk-yrs)    Types: Cigarettes, Cigars    Start date: 06/20/1977    Quit date: 06/20/1992    Years since quitting: 31.2   Smokeless tobacco: Never  Vaping Use   Vaping status: Never Used  Substance and Sexual Activity   Alcohol use: Yes    Comment: rare   Drug use: Never   Sexual activity: Not on file  Other Topics Concern   Not on file  Social History Narrative   Patient is married([name])Patient has two children.Patient is retired from [employer].Patient drinks two servings of coffee and tea daily.   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   BP 112/64 (BP Location: Left Arm, Patient Position: Sitting, Cuff Size: Normal)   Pulse 74   Resp 16   Ht 5\' 9"  (1.753 m)   Wt 77.6 kg   SpO2 97%   BMI 25.25 kg/m   Physical Examination:  General: Well developed, well nourished, NAD  HEENT: OP clear, mucus membranes moist  SKIN: warm, dry. No rashes. Neuro: No focal deficits  Musculoskeletal: Muscle strength 5/5 all ext  Psychiatric: Mood and affect normal  Neck: No JVD, no carotid bruits, no thyromegaly, no lymphadenopathy.  Lungs:Clear bilaterally, no wheezes, rhonci, crackles Cardiovascular: Regular rate and rhythm. No murmurs, gallops or rubs. Abdomen:Soft. Bowel sounds present. Non-tender.  Extremities: No lower extremity edema. Pulses are 2 + in the bilateral DP/PT.  EKG:  EKG is not ordered today. The ekg ordered today demonstrates   Recent Labs: No results found for requested labs within last 365 days.   Lipid Panel No results found for: "CHOL", "TRIG", "HDL", "CHOLHDL", "VLDL", "LDLCALC", "LDLDIRECT"   Wt Readings from Last 3 Encounters:  09/28/23 77.6 kg  08/23/23 76.3 kg  05/15/23 77.3 kg    Assessment and Plan:   1. Tachy/brady syndrome: Pacemaker in place. Followed in device clinic.   2. AAA: Followed in [facility] by Dr. Karin Lieu  3. Fatigue: Will arrange an echo to assess LV size and function  Labs/ tests ordered today include:   Orders Placed This Encounter  Procedures   ECHOCARDIOGRAM COMPLETE   Disposition:   F/U with me in 12 months   Signed, Verne Carrow, MD, [facility] 09/28/2023 4:07 PM    [facility] [address] [place], [place], [place]  [address] Phone: [phone]; Fax: [phone]

## 2023-09-28 NOTE — Patient Instructions (Addendum)
 Medication Instructions:  No changes *If you need a refill on your cardiac medications before your next appointment, please call your pharmacy*  Lab Work: none   Testing/Procedures: Your physician has requested that you have an echocardiogram. Echocardiography is a painless test that uses sound waves to create images of your heart. It provides your doctor with information about the size and shape of your heart and how well your heart's chambers and valves are working. This procedure takes approximately one hour. There are no restrictions for this procedure. Please do NOT wear cologne, perfume, aftershave, or lotions (deodorant is allowed). Please arrive 15 minutes prior to your appointment time.  Please note: We ask at that you not bring children with you during ultrasound (echo/ vascular) testing. Due to room size and safety concerns, children are not allowed in the ultrasound rooms during exams. Our front office staff cannot provide observation of children in our lobby area while testing is being conducted. An adult accompanying a patient to their appointment will only be allowed in the ultrasound room at the discretion of the ultrasound technician under special circumstances. We apologize for any inconvenience.   Follow-Up: At [facility], you and your health needs are our priority.  As part of our continuing mission to provide you with exceptional heart care, our providers are all part of one team.  This team includes your primary Cardiologist (physician) and Advanced Practice Providers or APPs (Physician Assistants and Nurse Practitioners) who all work together to provide you with the care you need, when you need it.  Your next appointment:   12 month(s)  Provider:   Verne Carrow, MD         1st Floor: - Lobby - Registration  - Pharmacy  - Lab - Cafe  2nd Floor: - PV Lab - Diagnostic Testing (echo, CT, nuclear med)  3rd Floor: - Vacant  4th Floor: -  TCTS (cardiothoracic surgery) - AFib Clinic - Structural Heart Clinic - Vascular Surgery  - Vascular Ultrasound  5th Floor: - HeartCare Cardiology (general and EP) - Clinical Pharmacy for coumadin, hypertension, lipid, weight-loss medications, and med management appointments    Valet parking services will be available as well.

## 2023-10-02 ENCOUNTER — Telehealth: Payer: Self-pay | Admitting: Neurology

## 2023-10-02 NOTE — Telephone Encounter (Signed)
 Pt cancelled appointment due to scheduling conflict, have an emergency, do not know when will reschedule. Will call back when can reschedule.

## 2023-10-03 ENCOUNTER — Encounter

## 2023-10-16 DIAGNOSIS — K080 Exfoliation of teeth due to systemic causes: Secondary | ICD-10-CM | POA: Diagnosis not present

## 2023-10-27 ENCOUNTER — Other Ambulatory Visit: Payer: Self-pay | Admitting: *Deleted

## 2023-10-27 DIAGNOSIS — I714 Abdominal aortic aneurysm, without rupture, unspecified: Secondary | ICD-10-CM

## 2023-11-03 ENCOUNTER — Other Ambulatory Visit (HOSPITAL_COMMUNITY)

## 2023-11-06 DIAGNOSIS — K080 Exfoliation of teeth due to systemic causes: Secondary | ICD-10-CM | POA: Diagnosis not present

## 2023-11-07 ENCOUNTER — Ambulatory Visit (INDEPENDENT_AMBULATORY_CARE_PROVIDER_SITE_OTHER): Payer: Medicare HMO

## 2023-11-07 DIAGNOSIS — I495 Sick sinus syndrome: Secondary | ICD-10-CM

## 2023-11-07 DIAGNOSIS — R29898 Other symptoms and signs involving the musculoskeletal system: Secondary | ICD-10-CM | POA: Diagnosis not present

## 2023-11-07 DIAGNOSIS — R197 Diarrhea, unspecified: Secondary | ICD-10-CM | POA: Diagnosis not present

## 2023-11-07 LAB — CUP PACEART REMOTE DEVICE CHECK
Battery Remaining Longevity: 115 mo
Battery Voltage: 3.01 V
Brady Statistic AP VP Percent: 0.04 %
Brady Statistic AP VS Percent: 98.41 %
Brady Statistic AS VP Percent: 0 %
Brady Statistic AS VS Percent: 1.55 %
Brady Statistic RA Percent Paced: 98.45 %
Brady Statistic RV Percent Paced: 0.04 %
Date Time Interrogation Session: 20250520062431
Implantable Lead Connection Status: 753985
Implantable Lead Connection Status: 753985
Implantable Lead Implant Date: 20210524
Implantable Lead Implant Date: 20210524
Implantable Lead Location: 753859
Implantable Lead Location: 753860
Implantable Lead Model: 5076
Implantable Lead Model: 5076
Implantable Pulse Generator Implant Date: 20210524
Lead Channel Impedance Value: 323 Ohm
Lead Channel Impedance Value: 437 Ohm
Lead Channel Impedance Value: 684 Ohm
Lead Channel Impedance Value: 874 Ohm
Lead Channel Pacing Threshold Amplitude: 0.5 V
Lead Channel Pacing Threshold Amplitude: 0.625 V
Lead Channel Pacing Threshold Pulse Width: 0.4 ms
Lead Channel Pacing Threshold Pulse Width: 0.4 ms
Lead Channel Sensing Intrinsic Amplitude: 3.125 mV
Lead Channel Sensing Intrinsic Amplitude: 3.125 mV
Lead Channel Sensing Intrinsic Amplitude: 7.75 mV
Lead Channel Sensing Intrinsic Amplitude: 7.75 mV
Lead Channel Setting Pacing Amplitude: 1.5 V
Lead Channel Setting Pacing Amplitude: 2.5 V
Lead Channel Setting Pacing Pulse Width: 0.4 ms
Lead Channel Setting Sensing Sensitivity: 1.2 mV
Zone Setting Status: 755011
Zone Setting Status: 755011

## 2023-11-08 ENCOUNTER — Ambulatory Visit (HOSPITAL_COMMUNITY)
Admission: RE | Admit: 2023-11-08 | Discharge: 2023-11-08 | Disposition: A | Discharge status: Home / Self Care | Payer: Medicare HMO | Source: Ambulatory Visit | Attending: Vascular Surgery | Admitting: Vascular Surgery

## 2023-11-08 ENCOUNTER — Ambulatory Visit: Payer: Medicare HMO | Attending: Vascular Surgery | Admitting: Physician Assistant

## 2023-11-08 VITALS — BP 124/80 | HR 79 | Temp 98.0°F | Wt 170.3 lb

## 2023-11-08 DIAGNOSIS — I714 Abdominal aortic aneurysm, without rupture, unspecified: Secondary | ICD-10-CM | POA: Diagnosis not present

## 2023-11-08 NOTE — Progress Notes (Signed)
 Office Note   History of Present Illness   Victor Stark is a 83 y.o. (Oct 31, 1940) male who presents for follow up of AAA.  He has a known AAA with a maximum previous diameter of 3.9 cm.  He returns today for follow-up.  He says that he is doing well without any complaints.  He denies any abdominal pain.  He denies any new or changing back pain.  He does have chronic lower back pain.  Current Outpatient Medications  Medication Sig Dispense Refill   acetaminophen  (TYLENOL ) 325 MG tablet Take 325-650 mg by mouth every 6 (six) hours as needed (pain.).      allopurinol (ZYLOPRIM) 100 MG tablet Take 50 mg by mouth daily.     Cholecalciferol (VITAMIN D) 2000 UNITS CAPS Take 2,000 Units by mouth daily.      Coenzyme Q10 200 MG capsule Take 200 mg by mouth daily.     fluticasone (FLONASE) 50 MCG/ACT nasal spray Place 2 sprays into both nostrils daily.     meclizine (ANTIVERT) 25 MG tablet Take 25 mg by mouth 3 (three) times daily as needed for dizziness.     Multiple Vitamin (MULTIVITAMIN WITH MINERALS) TABS Take 1 tablet by mouth daily. Centrum Silver     Omega-3 Fatty Acids (OMEGA-3 EPA FISH OIL PO) Take 950 mg by mouth daily.     prednisoLONE acetate (PRED FORTE) 1 % ophthalmic suspension Place 1 drop into the left eye 4 (four) times daily.     rosuvastatin (CRESTOR) 20 MG tablet Take 20 mg by mouth at bedtime.      telmisartan -hydrochlorothiazide  (MICARDIS  HCT) 80-12.5 MG tablet Take 1 tablet by mouth daily.     traZODone (DESYREL) 50 MG tablet Take 50 mg by mouth at bedtime.     valACYclovir (VALTREX) 500 MG tablet Take 500 mg by mouth as needed (BREAKOUTS).      No current facility-administered medications for this visit.    REVIEW OF SYSTEMS (negative unless checked):   Cardiac:  []  Chest pain or chest pressure? []  Shortness of breath upon activity? []  Shortness of breath when lying flat? []  Irregular heart rhythm?  Vascular:  []  Pain in calf, thigh, or hip brought on by  walking? []  Pain in feet at night that wakes you up from your sleep? []  Blood clot in your veins? []  Leg swelling?  Pulmonary:  []  Oxygen at home? []  Productive cough? []  Wheezing?  Neurologic:  []  Sudden weakness in arms or legs? []  Sudden numbness in arms or legs? []  Sudden onset of difficult speaking or slurred speech? []  Temporary loss of vision in one eye? []  Problems with dizziness?  Gastrointestinal:  []  Blood in stool? []  Vomited blood?  Genitourinary:  []  Burning when urinating? []  Blood in urine?  Psychiatric:  []  Major depression  Hematologic:  []  Bleeding problems? []  Problems with blood clotting?  Dermatologic:  []  Rashes or ulcers?  Constitutional:  []  Fever or chills?  Ear/Nose/Throat:  []  Change in hearing? []  Nose bleeds? []  Sore throat?  Musculoskeletal:  []  Back pain? []  Joint pain? []  Muscle pain?   Physical Examination    Vitals:   11/08/23 0928  BP: 124/80  Pulse: 79  Temp: 98 F (36.7 C)  TempSrc: Temporal  SpO2: 96%  Weight: 170 lb 4.8 oz (77.2 kg)   Body mass index is 25.15 kg/m.  General:  WDWN in NAD; vital signs documented above Gait: Not observed HENT: WNL, normocephalic Pulmonary: normal non-labored breathing ,  without rales, rhonchi,  wheezing Cardiac: regular Abdomen: soft, NT, no masses Skin: without rashes Vascular Exam/Pulses: BLE warm and well perfused Extremities: without ischemic changes, without gangrene , without cellulitis; without open wounds;  Musculoskeletal: no muscle wasting or atrophy  Neurologic: A&O X 3;  No focal weakness or paresthesias are detected Psychiatric:  The pt has Normal affect.   Non-Invasive Vascular Imaging   AAA Duplex (11/08/2023) Current size: 3.91 cm Previous size: 3.9 cm (09/2022) R CIA: 1.6 cm L CIA: 1.4 cm   Medical Decision Making   Victor Stark is a 83 y.o. (Jun 12, 1941) male who presents for surveillance of AAA  Based on this patient's duplex, his AAA  remains stable in size with a maximum diameter of 3.91 cm He denies any abdominal pain.  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 His lower extremities are well perfused with brisk capillary refill. He has no abdominal tenderness on exam The threshold for repair is AAA size > 5.5 cm, growth > 1 cm/yr, and symptomatic status. He can follow-up with our office in 1 year with repeat AAA duplex   Deneise Finlay PA-C [facility] Office: [phone]  Call BJ:[id number]

## 2023-11-15 DIAGNOSIS — Z08 Encounter for follow-up examination after completed treatment for malignant neoplasm: Secondary | ICD-10-CM | POA: Diagnosis not present

## 2023-11-15 DIAGNOSIS — D225 Melanocytic nevi of trunk: Secondary | ICD-10-CM | POA: Diagnosis not present

## 2023-11-15 DIAGNOSIS — Z1283 Encounter for screening for malignant neoplasm of skin: Secondary | ICD-10-CM | POA: Diagnosis not present

## 2023-11-15 DIAGNOSIS — Z85820 Personal history of malignant melanoma of skin: Secondary | ICD-10-CM | POA: Diagnosis not present

## 2023-11-16 ENCOUNTER — Ambulatory Visit: Payer: Self-pay | Admitting: Cardiovascular Disease

## 2023-11-29 ENCOUNTER — Ambulatory Visit (HOSPITAL_COMMUNITY)
Admission: RE | Admit: 2023-11-29 | Discharge: 2023-11-29 | Disposition: A | Discharge status: Home / Self Care | Source: Ambulatory Visit | Attending: Cardiology | Admitting: Cardiology

## 2023-11-29 DIAGNOSIS — R5383 Other fatigue: Secondary | ICD-10-CM

## 2023-11-29 DIAGNOSIS — I340 Nonrheumatic mitral (valve) insufficiency: Secondary | ICD-10-CM | POA: Diagnosis not present

## 2023-11-29 DIAGNOSIS — E785 Hyperlipidemia, unspecified: Secondary | ICD-10-CM | POA: Insufficient documentation

## 2023-11-29 DIAGNOSIS — I493 Ventricular premature depolarization: Secondary | ICD-10-CM | POA: Insufficient documentation

## 2023-11-29 DIAGNOSIS — I10 Essential (primary) hypertension: Secondary | ICD-10-CM | POA: Diagnosis not present

## 2023-11-29 DIAGNOSIS — I714 Abdominal aortic aneurysm, without rupture, unspecified: Secondary | ICD-10-CM | POA: Diagnosis not present

## 2023-11-29 DIAGNOSIS — I361 Nonrheumatic tricuspid (valve) insufficiency: Secondary | ICD-10-CM

## 2023-11-29 LAB — ECHOCARDIOGRAM COMPLETE
AR max vel: 3.24 cm2
AV Area VTI: 3.43 cm2
AV Area mean vel: 3.02 cm2
AV Mean grad: 4 mmHg
AV Peak grad: 7 mmHg
Ao pk vel: 1.33 m/s
Area-P 1/2: 2.73 cm2
P 1/2 time: 752 ms
S' Lateral: 2.6 cm

## 2023-11-30 ENCOUNTER — Ambulatory Visit: Payer: Self-pay | Admitting: Cardiovascular Disease

## 2023-11-30 DIAGNOSIS — E1122 Type 2 diabetes mellitus with diabetic chronic kidney disease: Secondary | ICD-10-CM | POA: Diagnosis not present

## 2023-11-30 DIAGNOSIS — Z23 Encounter for immunization: Secondary | ICD-10-CM | POA: Diagnosis not present

## 2023-11-30 DIAGNOSIS — Z0000 Encounter for general adult medical examination without abnormal findings: Secondary | ICD-10-CM | POA: Diagnosis not present

## 2023-11-30 DIAGNOSIS — Z8546 Personal history of malignant neoplasm of prostate: Secondary | ICD-10-CM | POA: Diagnosis not present

## 2023-11-30 DIAGNOSIS — I10 Essential (primary) hypertension: Secondary | ICD-10-CM | POA: Diagnosis not present

## 2023-11-30 DIAGNOSIS — M109 Gout, unspecified: Secondary | ICD-10-CM | POA: Diagnosis not present

## 2023-11-30 DIAGNOSIS — E782 Mixed hyperlipidemia: Secondary | ICD-10-CM | POA: Diagnosis not present

## 2023-11-30 DIAGNOSIS — I351 Nonrheumatic aortic (valve) insufficiency: Secondary | ICD-10-CM

## 2023-11-30 DIAGNOSIS — G479 Sleep disorder, unspecified: Secondary | ICD-10-CM | POA: Diagnosis not present

## 2023-12-05 ENCOUNTER — Telehealth: Payer: Self-pay | Admitting: Cardiovascular Disease

## 2023-12-05 DIAGNOSIS — I358 Other nonrheumatic aortic valve disorders: Secondary | ICD-10-CM

## 2023-12-05 DIAGNOSIS — I700 Atherosclerosis of aorta: Secondary | ICD-10-CM

## 2023-12-05 NOTE — Telephone Encounter (Signed)
 Patient reports he has been experiencing leg pain/tightness in his calves when walking, and soreness in his thighs. He states he has to walk at a much slower pace than usual. He is an active person and likes to play golf, this has been a significant hindrance.  He reports this started about 2-3 weeks ago. Denies any swelling, no pain/tightness at rest.  Patient has already discussed with his PCP, did not say what was recommended. Will forward to Dr. Abel Hoe to review and advise.

## 2023-12-05 NOTE — Telephone Encounter (Signed)
 Pt states he's been having leg weakness and doesn't understand why.

## 2023-12-11 NOTE — Telephone Encounter (Signed)
 Pt returning call, requesting cb

## 2023-12-11 NOTE — Telephone Encounter (Signed)
 Left a message for the pt to call back.

## 2023-12-11 NOTE — Telephone Encounter (Signed)
 Attempted return call to pt.  No voicemail message left at this time.

## 2023-12-11 NOTE — Telephone Encounter (Signed)
 Patient called back to say that not taking the medications help with the weakness in his legs. He is now wanting to know what medication should he take instead now. Please advise

## 2023-12-12 NOTE — Telephone Encounter (Signed)
 Spoke with patient and he states he was told to stop his rosuvastatin by  nurse because it was causing muscle pain.   He states it has helped with his muscle pain. He would like a call to discuss other options.

## 2023-12-12 NOTE — Telephone Encounter (Signed)
 Patient has been off rosuvastatin for 8 days.  The fatigue, weakness, pain in his legs is much better.  He is going to stay off the statin for now.  Willing to consult w PharmD re: alternatives to statins.  His PCP keeps a check on his lipids.  He is not sure if they have a lipid clinic or not.     Referral placed for our Lipid Clinic.  Pt aware to remain off statin and he will be called to schedule appointment w lipid clinic.  Grateful for assistance.

## 2023-12-12 NOTE — Telephone Encounter (Signed)
  Patient is returning call to discuss his medications

## 2023-12-12 NOTE — Addendum Note (Signed)
 Addended by: Dejon Jungman on: 12/12/2023 03:28 PM   Modules accepted: Orders

## 2023-12-19 DIAGNOSIS — H3581 Retinal edema: Secondary | ICD-10-CM | POA: Diagnosis not present

## 2023-12-19 DIAGNOSIS — H04122 Dry eye syndrome of left lacrimal gland: Secondary | ICD-10-CM | POA: Diagnosis not present

## 2023-12-19 DIAGNOSIS — H35373 Puckering of macula, bilateral: Secondary | ICD-10-CM | POA: Diagnosis not present

## 2023-12-19 DIAGNOSIS — H43391 Other vitreous opacities, right eye: Secondary | ICD-10-CM | POA: Diagnosis not present

## 2023-12-29 NOTE — Progress Notes (Signed)
 Remote pacemaker transmission.

## 2024-01-05 DIAGNOSIS — N1832 Chronic kidney disease, stage 3b: Secondary | ICD-10-CM | POA: Diagnosis not present

## 2024-01-09 DIAGNOSIS — M25511 Pain in right shoulder: Secondary | ICD-10-CM | POA: Diagnosis not present

## 2024-01-12 DIAGNOSIS — N1832 Chronic kidney disease, stage 3b: Secondary | ICD-10-CM | POA: Diagnosis not present

## 2024-01-12 DIAGNOSIS — D631 Anemia in chronic kidney disease: Secondary | ICD-10-CM | POA: Diagnosis not present

## 2024-01-12 DIAGNOSIS — M1990 Unspecified osteoarthritis, unspecified site: Secondary | ICD-10-CM | POA: Diagnosis not present

## 2024-01-12 DIAGNOSIS — I129 Hypertensive chronic kidney disease with stage 1 through stage 4 chronic kidney disease, or unspecified chronic kidney disease: Secondary | ICD-10-CM | POA: Diagnosis not present

## 2024-01-22 DIAGNOSIS — D2262 Melanocytic nevi of left upper limb, including shoulder: Secondary | ICD-10-CM | POA: Diagnosis not present

## 2024-01-22 DIAGNOSIS — Z85820 Personal history of malignant melanoma of skin: Secondary | ICD-10-CM | POA: Diagnosis not present

## 2024-01-22 DIAGNOSIS — L821 Other seborrheic keratosis: Secondary | ICD-10-CM | POA: Diagnosis not present

## 2024-01-22 DIAGNOSIS — Z08 Encounter for follow-up examination after completed treatment for malignant neoplasm: Secondary | ICD-10-CM | POA: Diagnosis not present

## 2024-01-22 DIAGNOSIS — D485 Neoplasm of uncertain behavior of skin: Secondary | ICD-10-CM | POA: Diagnosis not present

## 2024-01-22 DIAGNOSIS — D225 Melanocytic nevi of trunk: Secondary | ICD-10-CM | POA: Diagnosis not present

## 2024-01-30 ENCOUNTER — Other Ambulatory Visit (HOSPITAL_COMMUNITY): Payer: Self-pay

## 2024-01-30 ENCOUNTER — Ambulatory Visit: Attending: Cardiology | Admitting: Pharmacist

## 2024-01-30 ENCOUNTER — Telehealth: Payer: Self-pay

## 2024-01-30 ENCOUNTER — Encounter: Payer: Self-pay | Admitting: Pharmacist

## 2024-01-30 ENCOUNTER — Telehealth: Payer: Self-pay | Admitting: Pharmacist

## 2024-01-30 DIAGNOSIS — E7849 Other hyperlipidemia: Secondary | ICD-10-CM

## 2024-01-30 DIAGNOSIS — I700 Atherosclerosis of aorta: Secondary | ICD-10-CM

## 2024-01-30 DIAGNOSIS — E785 Hyperlipidemia, unspecified: Secondary | ICD-10-CM | POA: Insufficient documentation

## 2024-01-30 MED ORDER — ICOSAPENT ETHYL 1 G PO CAPS: 2.0000 g | ORAL_CAPSULE | Freq: Two times a day (BID) | ORAL | 11 refills | Status: AC

## 2024-01-30 NOTE — Telephone Encounter (Signed)
   Scanned in media

## 2024-01-30 NOTE — Patient Instructions (Addendum)
 Your Results:             Your most recent labs Goal  Total Cholesterol 135 < 200  Triglycerides 203 < 150  HDL (happy/good cholesterol) 42 > 40  LDL (lousy/bad cholesterol 60 < 70   Medication changes: continue taking Atorvastatin 10 mg 3 times per week We will start the process to get Vascepa   covered by your insurance.  Once the prior authorization is complete, we will call you to let you know and confirm pharmacy information.       Lab orders: We want to repeat labs after 2-3 months.  We will send you a lab order to remind you once we get closer to that time.

## 2024-01-30 NOTE — Assessment & Plan Note (Signed)
 Pt has been taking Lipitor 10 mg 3 times per week and the last lab from 11/2023 is reflection of him being on that dose of statin. His LDL is at goal but TG is elevated. Lifestyle intervention is discussed in great details. Given A1c in now in diabetic range will consider replacing OTC fish oil to Vascepa  to get TG at goal and reduce CVD risk  Pt tot start taking Vascepa  1 gm twice daily and continue taking atorvastatin 10 mg 3 times per week. F/u fasting lipid lab is due on Apr 24, 2024.

## 2024-01-30 NOTE — Telephone Encounter (Signed)
 Pharmacy Patient Advocate Encounter  Received notification from [facility] that Prior Authorization for REPATHA has been APPROVED from 01/30/24 to 01/29/25. Ran test claim, Copay is $47.14. This test claim was processed through [facility]- copay amounts may vary at other pharmacies due to pharmacy/plan contracts, or as the patient moves through the different stages of their insurance plan.

## 2024-01-30 NOTE — Progress Notes (Signed)
 Patient ID: HAEDEN HUDOCK                 DOB: 04-05-1941                    MRN: [id number]      HPI: Victor Stark is a 83 y.o. male patient referred to lipid clinic by Dr.McAlhany. PMH is significant for HTN, HLD, prostate cancer, CKD, AAA, symptomatic bradycardia s/p pacemaker placement, vertigo   Per msg Unable to tolerate hydrophilic and lipophilic statins. Currently not on any lipid lowering agent. Pt was referred to lipid clinic.  Pt presented today for lipid clinic. Reports he has been taking Lipitor 10 mg 3 times per week and the last lab from 11/2023 is reflection of him being on that dose of statin. His LDL is at goal but TG is elevated. Lifestyle intervention is discussed in great details. Given A1c in now in diabetic range will consider replacing OTC fish oil to Vascepa  to get TG at goal and reduce CVD risk     Current Medications: Atorvastatin 10 mg 3 times per week  Intolerances: atorvastatin, rosuvastatin - myalgia, fatigue  Risk Factors: HTN, HLD, CKD LDL goal: <70   Lat lab: 11/2023  Diet: protein based diet, likes pasta and bread, avoid fried food   Eats out 1-2 times per week   Exercise: not much due to arm pain - walking 1/2 mile per day, stretches    Family History:  Father - died from MI at age 49  Brother - died MI at age 47   Social History:  Alcohol: never Smoking: never   Labs:  Lipid Panel  No results found for: CHOL, TRIG, HDL, CHOLHDL, VLDL, LDLCALC, LDLDIRECT, LABVLDL  Past Medical History:  Diagnosis Date   AAA (abdominal aortic aneurysm) (HCC) 10/21/2013   3.1 in 1/14.    Actinic keratoses    Dr. 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 Bennetta   Cough 03/22/2013   Followed in Pulmonary clinic/ [facility]/ [place] - try off acei 03/22/2013  > improved 04/19/2013     Diverticulitis 2012   Ganglion cyst of wrist    left- Dr. Leonor    Headache(784.0) 06/06/2013   HTN (hypertension) 04/19/2013   D/c ACEi 03/23/13 due to cough     Hx of cardiovascular stress test    LexiScan  Myoview  (9/15):  Normal study, EF 49% (visually looks normal; quantifies mildly decreased).  // Myoview  01/2019: EF 62, no ischemia or scar, low risk    Hyperlipidemia    Hypertension    Melanoma (HCC) 2013   Dr. 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 Letha    Current Outpatient Medications on File Prior to Visit  Medication Sig Dispense Refill   acetaminophen  (TYLENOL ) 325 MG tablet Take 325-650 mg by mouth every 6 (six) hours as needed (pain.).      allopurinol (ZYLOPRIM) 100 MG tablet Take 50 mg by mouth daily.     Cholecalciferol (VITAMIN D) 2000 UNITS CAPS Take 2,000 Units by mouth daily.      Coenzyme Q10 200 MG capsule Take 200 mg  by mouth daily.     fluticasone (FLONASE) 50 MCG/ACT nasal spray Place 2 sprays into both nostrils daily.     meclizine (ANTIVERT) 25 MG tablet Take 25 mg by mouth 3 (three) times daily as needed for dizziness.     Multiple Vitamin (MULTIVITAMIN WITH MINERALS) TABS Take 1 tablet by mouth daily. Centrum Silver     prednisoLONE acetate (PRED FORTE) 1 % ophthalmic suspension Place 1 drop into the left eye 4 (four) times daily.     telmisartan -hydrochlorothiazide  (MICARDIS  HCT) 80-12.5 MG tablet Take 1 tablet by mouth daily.     traZODone (DESYREL) 50 MG tablet Take 50 mg by mouth at bedtime.     valACYclovir (VALTREX) 500 MG tablet Take 500 mg by mouth as needed (BREAKOUTS).      No current facility-administered medications on file prior to visit.    Allergies  Allergen Reactions   Ace Inhibitors Cough and Swelling    Other Reaction(s): dry cough, Other (See Comments)  cough, cough,  cough  Dry cough, Dry cough, Dry cough   Atorvastatin Other (See Comments)    fatigue  Other Reaction(s): abdominal pain, fatigue  fatigue, fatigue, fatigue   Etodolac Hives and Swelling    Other Reaction(s): hives, hives,swelling   Hydrocodone-Acetaminophen  Hives and Swelling    Other Reaction(s): swelling/hives   Lipitor [Atorvastatin Calcium] Other (See Comments)    Muscle pain/fatigue   Lisinopril Other (See Comments)    Dry cough    Pravastatin Other (See Comments)    Muscle aches  Other Reaction(s): muscle aches, Other (See Comments)  Muscle aches, Muscle aches, Muscle aches   Vicodin [Hydrocodone-Acetaminophen ] Hives and Swelling   Crestor [Rosuvastatin] Other (See Comments)   Dutasteride Other (See Comments)    Lack of therapeutic effect  Other Reaction(s): intolerant  Lack of therapeutic effect, Lack of therapeutic effect, Lack of therapeutic effect   Hydrocodone     Other Reaction(s): Not available   Nsaids     Other reaction(s): CKD  Other Reaction(s): ckd   Amoxicillin Rash    Other Reaction(s): hives, Not available    Assessment/Plan:  1. Hyperlipidemia -  Problem  Hyperlipidemia   Current Medications: Atorvastatin 10 mg 3 times per week  Intolerances: atorvastatin, rosuvastatin - myalgia, fatigue  Risk Factors: HTN, HLD, CKD, family hx of CAD/MI LDL goal: <70 mg/dl Last lab 93/7974 : TC 864, TG 203, LDLc 60, HDL 42 while on Atorvastatin 10 mg 3 times per week     Hyperlipidemia Pt has been taking Lipitor 10 mg 3 times per week and the last lab from 11/2023 is reflection of him being on that dose of statin. His LDL is at goal but TG is elevated. Lifestyle intervention is discussed in great details. Given A1c in now in diabetic range will consider replacing OTC fish oil to Vascepa  to get TG at goal and reduce CVD risk  Pt tot start taking Vascepa  1 gm twice daily and continue taking atorvastatin 10 mg 3 times per week. F/u fasting lipid lab is due  on Apr 24, 2024.     Thank you,  Robbi Blanch, Pharm.D [facility]. [facility] [address], [place], [place] [address] Phone: [phone]; Fax: [phone]

## 2024-01-30 NOTE — Telephone Encounter (Signed)
 Pharmacy Patient Advocate Encounter   Received notification from Physician's Office that prior authorization for ICOSAPENT  is required/requested.   Insurance verification completed.   The patient is insured through [facility] .   Per test claim: The current 30 day co-pay is, $116.82.  No PA needed at this time. This test claim was processed through [facility]- copay amounts may vary at other pharmacies due to pharmacy/plan contracts, or as the patient moves through the different stages of their insurance plan.

## 2024-01-30 NOTE — Telephone Encounter (Signed)
 Pharmacy Patient Advocate Encounter   Received notification from Physician's Office that prior authorization for REPATHA is required/requested.   Insurance verification completed.   The patient is insured through [facility] .   Per test claim: PA required; PA submitted to above mentioned insurance via Latent Key/confirmation #/EOC [id number] Status is pending

## 2024-01-30 NOTE — Telephone Encounter (Signed)
 Pharmacy Patient Advocate Encounter  Insurance verification completed.   The patient is insured through [facility]   Ran test claim for VASCEPA . Currently a quantity of 120 is a 30 day supply and the co-pay is $47.14 . The current 30 day co-pay is, $47.14.  No PA needed at this time.  This test claim was processed through [facility]- copay amounts may vary at other pharmacies due to pharmacy/plan contracts, or as the patient moves through the different stages of their insurance plan.

## 2024-01-30 NOTE — Telephone Encounter (Signed)
PA request has been Submitted.

## 2024-02-01 MED ORDER — REPATHA SURECLICK 140 MG/ML ~~LOC~~ SOAJ: 140.0000 mg | SUBCUTANEOUS | 3 refills | Status: DC

## 2024-02-01 NOTE — Addendum Note (Signed)
 Addended by: Ronne Savoia K on: 02/01/2024 11:28 AM   Modules accepted: Orders

## 2024-02-01 NOTE — Addendum Note (Signed)
 Addended by: Samyria Rudie K on: 02/01/2024 08:43 AM   Modules accepted: Orders

## 2024-02-01 NOTE — Telephone Encounter (Signed)
 Repatha  prescription sent by mistake. LDL at goal on Lipitor 10 mg 3 times per week ( 60 mg/dl) no need to add Repatha  only adding Vascepa  to Statin to lower TG at goal. Discussed with patient and informed pharmacy to cancel prescription.

## 2024-02-01 NOTE — Telephone Encounter (Signed)
 Pt called in asking to speak with you again. He also a couple questions/concerns about atorvastatin and the new medication

## 2024-02-05 DIAGNOSIS — M19011 Primary osteoarthritis, right shoulder: Secondary | ICD-10-CM | POA: Diagnosis not present

## 2024-02-05 DIAGNOSIS — G8929 Other chronic pain: Secondary | ICD-10-CM | POA: Diagnosis not present

## 2024-02-06 ENCOUNTER — Ambulatory Visit (INDEPENDENT_AMBULATORY_CARE_PROVIDER_SITE_OTHER): Payer: Medicare HMO

## 2024-02-06 DIAGNOSIS — I495 Sick sinus syndrome: Secondary | ICD-10-CM

## 2024-02-07 LAB — CUP PACEART REMOTE DEVICE CHECK
Battery Remaining Longevity: 112 mo
Battery Voltage: 3.01 V
Brady Statistic AP VP Percent: 0.04 %
Brady Statistic AP VS Percent: 99.34 %
Brady Statistic AS VP Percent: 0 %
Brady Statistic AS VS Percent: 0.62 %
Brady Statistic RA Percent Paced: 99.38 %
Brady Statistic RV Percent Paced: 0.04 %
Date Time Interrogation Session: 20250819053132
Implantable Lead Connection Status: 753985
Implantable Lead Connection Status: 753985
Implantable Lead Implant Date: 20210524
Implantable Lead Implant Date: 20210524
Implantable Lead Location: 753859
Implantable Lead Location: 753860
Implantable Lead Model: 5076
Implantable Lead Model: 5076
Implantable Pulse Generator Implant Date: 20210524
Lead Channel Impedance Value: 304 Ohm
Lead Channel Impedance Value: 418 Ohm
Lead Channel Impedance Value: 665 Ohm
Lead Channel Impedance Value: 874 Ohm
Lead Channel Pacing Threshold Amplitude: 0.5 V
Lead Channel Pacing Threshold Amplitude: 0.625 V
Lead Channel Pacing Threshold Pulse Width: 0.4 ms
Lead Channel Pacing Threshold Pulse Width: 0.4 ms
Lead Channel Sensing Intrinsic Amplitude: 3.5 mV
Lead Channel Sensing Intrinsic Amplitude: 3.5 mV
Lead Channel Sensing Intrinsic Amplitude: 7 mV
Lead Channel Sensing Intrinsic Amplitude: 7 mV
Lead Channel Setting Pacing Amplitude: 1.5 V
Lead Channel Setting Pacing Amplitude: 2.5 V
Lead Channel Setting Pacing Pulse Width: 0.4 ms
Lead Channel Setting Sensing Sensitivity: 1.2 mV
Zone Setting Status: 755011
Zone Setting Status: 755011

## 2024-02-12 ENCOUNTER — Ambulatory Visit: Payer: Self-pay | Admitting: Cardiovascular Disease

## 2024-02-16 DIAGNOSIS — M6281 Muscle weakness (generalized): Secondary | ICD-10-CM | POA: Diagnosis not present

## 2024-02-16 DIAGNOSIS — M25511 Pain in right shoulder: Secondary | ICD-10-CM | POA: Diagnosis not present

## 2024-02-22 DIAGNOSIS — M6281 Muscle weakness (generalized): Secondary | ICD-10-CM | POA: Diagnosis not present

## 2024-02-22 DIAGNOSIS — M25511 Pain in right shoulder: Secondary | ICD-10-CM | POA: Diagnosis not present

## 2024-02-23 DIAGNOSIS — I10 Essential (primary) hypertension: Secondary | ICD-10-CM | POA: Diagnosis not present

## 2024-02-23 DIAGNOSIS — Z23 Encounter for immunization: Secondary | ICD-10-CM | POA: Diagnosis not present

## 2024-02-23 DIAGNOSIS — R0982 Postnasal drip: Secondary | ICD-10-CM | POA: Diagnosis not present

## 2024-02-23 DIAGNOSIS — R1013 Epigastric pain: Secondary | ICD-10-CM | POA: Diagnosis not present

## 2024-02-23 DIAGNOSIS — R42 Dizziness and giddiness: Secondary | ICD-10-CM | POA: Diagnosis not present

## 2024-02-26 DIAGNOSIS — R197 Diarrhea, unspecified: Secondary | ICD-10-CM | POA: Diagnosis not present

## 2024-02-27 DIAGNOSIS — M6281 Muscle weakness (generalized): Secondary | ICD-10-CM | POA: Diagnosis not present

## 2024-02-27 DIAGNOSIS — M25511 Pain in right shoulder: Secondary | ICD-10-CM | POA: Diagnosis not present

## 2024-03-08 DIAGNOSIS — I10 Essential (primary) hypertension: Secondary | ICD-10-CM | POA: Diagnosis not present

## 2024-03-08 DIAGNOSIS — Z6825 Body mass index (BMI) 25.0-25.9, adult: Secondary | ICD-10-CM | POA: Diagnosis not present

## 2024-03-08 DIAGNOSIS — B356 Tinea cruris: Secondary | ICD-10-CM | POA: Diagnosis not present

## 2024-03-08 DIAGNOSIS — L0390 Cellulitis, unspecified: Secondary | ICD-10-CM | POA: Diagnosis not present

## 2024-03-12 DIAGNOSIS — M25511 Pain in right shoulder: Secondary | ICD-10-CM | POA: Diagnosis not present

## 2024-03-12 DIAGNOSIS — M6281 Muscle weakness (generalized): Secondary | ICD-10-CM | POA: Diagnosis not present

## 2024-03-13 DIAGNOSIS — K080 Exfoliation of teeth due to systemic causes: Secondary | ICD-10-CM | POA: Diagnosis not present

## 2024-03-13 NOTE — Progress Notes (Signed)
 Remote PPM Transmission

## 2024-03-19 DIAGNOSIS — M25511 Pain in right shoulder: Secondary | ICD-10-CM | POA: Diagnosis not present

## 2024-03-19 DIAGNOSIS — M6281 Muscle weakness (generalized): Secondary | ICD-10-CM | POA: Diagnosis not present

## 2024-03-20 DIAGNOSIS — K080 Exfoliation of teeth due to systemic causes: Secondary | ICD-10-CM | POA: Diagnosis not present

## 2024-03-26 DIAGNOSIS — M6281 Muscle weakness (generalized): Secondary | ICD-10-CM | POA: Diagnosis not present

## 2024-03-26 DIAGNOSIS — M25511 Pain in right shoulder: Secondary | ICD-10-CM | POA: Diagnosis not present

## 2024-03-29 DIAGNOSIS — N2581 Secondary hyperparathyroidism of renal origin: Secondary | ICD-10-CM | POA: Diagnosis not present

## 2024-04-02 DIAGNOSIS — M6281 Muscle weakness (generalized): Secondary | ICD-10-CM | POA: Diagnosis not present

## 2024-04-02 DIAGNOSIS — M25511 Pain in right shoulder: Secondary | ICD-10-CM | POA: Diagnosis not present

## 2024-04-09 DIAGNOSIS — M6281 Muscle weakness (generalized): Secondary | ICD-10-CM | POA: Diagnosis not present

## 2024-04-09 DIAGNOSIS — M25511 Pain in right shoulder: Secondary | ICD-10-CM | POA: Diagnosis not present

## 2024-04-16 DIAGNOSIS — M6281 Muscle weakness (generalized): Secondary | ICD-10-CM | POA: Diagnosis not present

## 2024-04-16 DIAGNOSIS — M25511 Pain in right shoulder: Secondary | ICD-10-CM | POA: Diagnosis not present

## 2024-04-18 DIAGNOSIS — R059 Cough, unspecified: Secondary | ICD-10-CM | POA: Diagnosis not present

## 2024-04-18 DIAGNOSIS — Z6825 Body mass index (BMI) 25.0-25.9, adult: Secondary | ICD-10-CM | POA: Diagnosis not present

## 2024-04-18 DIAGNOSIS — M542 Cervicalgia: Secondary | ICD-10-CM | POA: Diagnosis not present

## 2024-04-22 DIAGNOSIS — E7849 Other hyperlipidemia: Secondary | ICD-10-CM | POA: Diagnosis not present

## 2024-04-22 LAB — LIPID PANEL: Chol/HDL Ratio: 4.5 ratio (ref 0.0–5.0)

## 2024-04-23 ENCOUNTER — Ambulatory Visit: Payer: Self-pay | Admitting: Pharmacist

## 2024-04-23 LAB — HEPATIC FUNCTION PANEL
ALT: 10 IU/L (ref 0–44)
AST: 15 IU/L (ref 0–40)
Albumin: 4 g/dL (ref 3.7–4.7)
Alkaline Phosphatase: 81 IU/L (ref 48–129)
Bilirubin Total: 0.3 mg/dL (ref 0.0–1.2)
Bilirubin, Direct: 0.11 mg/dL (ref 0.00–0.40)
Total Protein: 5.9 g/dL — ABNORMAL LOW (ref 6.0–8.5)

## 2024-04-23 LAB — LIPID PANEL
Cholesterol, Total: 174 mg/dL (ref 100–199)
HDL: 39 mg/dL — ABNORMAL LOW (ref >39)
LDL CALC COMMENT:: 4.5 ratio (ref 0.0–5.0)
LDL Chol Calc (NIH): 103 mg/dL — AB (ref 0–99)
Triglycerides: 186 mg/dL — AB (ref 0–149)
VLDL Cholesterol Cal: 32 mg/dL (ref 5–40)

## 2024-04-23 NOTE — Telephone Encounter (Signed)
 Call to discuss lipid lab result. N/A LVM to call back. MyChart sent

## 2024-04-26 NOTE — Telephone Encounter (Signed)
 Patient Follow-Up: Patient called back and provided an update on his current medication regimen. He reports taking rosuvastatin 10 mg three times per week and Vascepa  2 grams twice daily as prescribed. He expressed that he is not interested in trying any injectable therapies at this time. Next Steps: An appointment is scheduled for November 26 at 11:30 AM to discuss alternative treatment options.

## 2024-05-03 ENCOUNTER — Ambulatory Visit (INDEPENDENT_AMBULATORY_CARE_PROVIDER_SITE_OTHER)

## 2024-05-03 ENCOUNTER — Ambulatory Visit

## 2024-05-03 VITALS — BP 120/76 | HR 72 | Ht 69.0 in | Wt 169.0 lb

## 2024-05-03 DIAGNOSIS — R059 Cough, unspecified: Secondary | ICD-10-CM | POA: Diagnosis not present

## 2024-05-03 DIAGNOSIS — R053 Chronic cough: Secondary | ICD-10-CM | POA: Diagnosis not present

## 2024-05-03 MED ORDER — FAMOTIDINE 20 MG PO TABS
20.0000 mg | ORAL_TABLET | Freq: Two times a day (BID) | ORAL | Status: AC
Start: 2024-05-03 — End: ?

## 2024-05-03 MED ORDER — GUAIFENESIN-CODEINE 100-10 MG/5ML PO SOLN
5.0000 mL | ORAL | 0 refills | Status: AC | PRN
Start: 2024-05-03 — End: ?

## 2024-05-03 NOTE — Patient Instructions (Signed)
  VISIT SUMMARY: Today, you were seen for a chronic cough that has persisted for over a year. We discussed your symptoms, medical history, and potential causes of your cough. We also reviewed your current medications and lifestyle factors that might be contributing to your condition.  YOUR PLAN: -CHRONIC COUGH: A chronic cough is a cough that lasts for an extended period, often more than eight weeks. It can be caused by various factors, including silent acid reflux, post-nasal drip, or sinus issues. To address your chronic cough, we have ordered pulmonary function tests to assess your lung function and a chest X-ray to evaluate your lung status. We have also prescribed cough medicine for symptomatic relief and reinitiated Pepcid  for one month. Additionally, we provided instructions for a GERD diet to help manage potential silent acid reflux.  -SCAR ON LEFT LUNG (LINGULA): A scar on the lung, particularly in the lingula, can result from a prior infection and may contribute to mucus buildup and chronic cough. To evaluate the scar and ensure there is no new pathology, we have ordered a chest X-ray.  INSTRUCTIONS: Please follow the instructions for the GERD diet and take the prescribed Pepcid  for one month. Use the cough medicine as directed for symptomatic relief. Complete the pulmonary function tests and chest X-ray as ordered. Follow up with us  after completing these tests to review the results and discuss the next steps.     Foods to Avoid:  Acidic foods: Citrus fruits, tomatoes, tomato-based products, vinegar, garlic, onions  Fatty foods: Fried foods, fatty meats, whole milk, cheese  Spicy foods: Chili peppers, black pepper, mustard  Chocolate: Contains caffeine and theobromine, which relax the lower esophageal sphincter (LES)  Alcohol: Relaxes the LES and increases stomach acid production  Caffeine: Found in coffee, tea, and some sodas, it can stimulate stomach acid production    Foods to  Eat:  Lean proteins: Chicken, fish, eggs Non-acidic fruits: Apples, bananas, pears, grapes Vegetables: Steamed, roasted, or boiled vegetables (e.g., broccoli, carrots, green beans) Whole grains: Brown rice, quinoa, oatmeal Low-fat dairy: Skim milk, low-fat yogurt, low-fat cheese Water: Staying hydrated helps dilute stomach acid    Other Dietary Recommendations: Eat smaller, more frequent meals. Avoid eating within 2-3 hours of bedtime. Chew food thoroughly. Limit sugary drinks and processed foods. Consider a Mediterranean-style diet, which is rich in fruits, vegetables, and whole grains.          Contains text generated by [employer].

## 2024-05-03 NOTE — Progress Notes (Signed)
 Subjective:   PATIENT ID: Victor Stark GENDER: male DOB: 21-Jan-1941, MRN: [id number]   HPI Discussed the use of AI scribe software for clinical note transcription with the patient, who gave verbal consent to proceed.  History of Present Illness Victor Stark is an 83 year old male who presents with a chronic cough. He is accompanied by his wife, Ginnie.  He has experienced a persistent cough for over a year, primarily occurring in the evenings and sometimes in the mornings. The cough is productive of light, clear phlegm, which requires several attempts to expectorate. Despite previous medical consultations and treatments, the cough has persisted for at least eight months.  He has a history of smoking cigarettes for over fifteen years, quitting more than thirty years ago, and occasionally smoking cigars thereafter. He denies any current smoking habits. There is no history of breathing issues requiring hospitalization, nor recent infections such as pneumonia or bronchitis.  No acid reflux or heartburn for a long time, and he does not recall how it resolved. No allergies to pollen, animals, or other common allergens, though he is allergic to some medications. He had a dog until two months ago but has no other pets or exposure to birds or chickens.  No issues with swallowing, choking, or coughing when drinking water. No history of asthma, postnasal drip, or sinus issues. Recently experienced a runny nose, but he does not associate it with his cough.  His current medications include telmisartan  for blood pressure. He was previously on lisinopril.     Past Medical History:  Diagnosis Date   AAA (abdominal aortic aneurysm) 10/21/2013   3.1 in 1/14.    Actinic keratoses    Dr. 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 Bennetta   Cough 03/22/2013   Followed in Pulmonary clinic/ [facility]/  [facility] - try off acei 03/22/2013  > improved 04/19/2013     Diverticulitis 2012   Ganglion cyst of wrist    left- Dr. Leonor   Headache(784.0) 06/06/2013   HTN (hypertension) 04/19/2013   D/c ACEi 03/23/13 due to cough     Hx of cardiovascular stress test    LexiScan  Myoview  (9/15):  Normal study, EF 49% (visually looks normal; quantifies mildly decreased).  // Myoview  01/2019: EF 62, no ischemia or scar, low risk    Hyperlipidemia    Hypertension    Melanoma (HCC) 2013   Dr. Junior   Prostate cancer [facility])    PVC's (premature ventricular contractions) 10/21/2013   Monitor in 2014 revealed normal sinus rhythm with PVCs    Solitary pulmonary nodule 03/23/2013   First detected 07/04/12 - See CT and f/u 09/12/12 improved     Spondylosis    Dr. Letha     Family History  Problem Relation Age of Onset   Heart attack Father    Asthma Sister    Heart attack Daughter 107     Social History   Socioeconomic History   Marital status: Married    Spouse name: Ginnie   Number of children: 2   Years of education: college   Highest education level: Not on file  Occupational History   Occupation: Health And Safety Inspector of Engineer, Production: RETIRED  Tobacco Use   Smoking status: Former    Current packs/day: 0.00    Average packs/day: 0.8 packs/day for 15.0 years (11.3 ttl pk-yrs)  Types: Cigarettes, Cigars    Start date: 06/20/1977    Quit date: 06/20/1992    Years since quitting: 31.8   Smokeless tobacco: Never  Vaping Use   Vaping status: Never Used  Substance and Sexual Activity   Alcohol use: Yes    Comment: rare   Drug use: Never   Sexual activity: Not on file  Other Topics Concern   Not on file  Social History Narrative   Patient is married([name])Patient has two children.Patient is retired from [employer].Patient drinks two servings of coffee and tea daily.   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, Nurse, Mental Health    Social Drivers of Corporate Investment Banker Strain: Not on  [employer] Insecurity: Not on file  Transportation Needs: Not on file  Physical Activity: Not on file  Stress: Not on file  Social Connections: Not on file  Intimate Partner Violence: Not on file     Allergies  Allergen Reactions   Ace Inhibitors Cough and Swelling    Other Reaction(s): dry cough, Other (See Comments)  cough, cough, cough  Dry cough, Dry cough, Dry cough   Etodolac Hives and Swelling    Other Reaction(s): hives, hives,swelling   Hydrocodone-Acetaminophen  Hives and Swelling    Other Reaction(s): swelling/hives   Lipitor [Atorvastatin Calcium] Other (See Comments)    Muscle pain/fatigue   Lisinopril Other (See Comments)    Dry cough    Pravastatin Other (See Comments)    Muscle aches  Other Reaction(s): muscle aches, Other (See Comments)  Muscle aches, Muscle aches, Muscle aches   Vicodin [Hydrocodone-Acetaminophen ] Hives and Swelling   Crestor [Rosuvastatin] Other (See Comments)   Dutasteride Other (See Comments)    Lack of therapeutic effect  Other Reaction(s): intolerant  Lack of therapeutic effect, Lack of therapeutic effect, Lack of therapeutic effect   Hydrocodone     Other Reaction(s): Not available   Nsaids     Other reaction(s): CKD  Other Reaction(s): ckd   Amoxicillin Rash    Other Reaction(s): hives, Not available     Outpatient Medications Prior to Visit  Medication Sig Dispense Refill   acetaminophen  (TYLENOL ) 325 MG tablet Take 325-650 mg by mouth every 6 (six) hours as needed (pain.).      allopurinol (ZYLOPRIM) 100 MG tablet Take 50 mg by mouth daily.     Cholecalciferol (VITAMIN D) 2000 UNITS CAPS Take 2,000 Units by mouth daily.      Coenzyme Q10 200 MG capsule Take 200 mg by mouth daily.     icosapent  Ethyl (VASCEPA ) 1 g capsule Take 2 capsules (2 g total) by mouth 2 (two) times daily. 120 capsule 11   meclizine (ANTIVERT) 25 MG tablet Take 25 mg by mouth 3 (three) times daily as needed for dizziness.     Multiple Vitamin  (MULTIVITAMIN WITH MINERALS) TABS Take 1 tablet by mouth daily. Centrum Silver     telmisartan -hydrochlorothiazide  (MICARDIS  HCT) 80-12.5 MG tablet Take 1 tablet by mouth daily.     traZODone (DESYREL) 50 MG tablet Take 50 mg by mouth at bedtime.     valACYclovir (VALTREX) 500 MG tablet Take 500 mg by mouth as needed (BREAKOUTS).      fluticasone (FLONASE) 50 MCG/ACT nasal spray Place 2 sprays into both nostrils daily.     prednisoLONE acetate (PRED FORTE) 1 % ophthalmic suspension Place 1 drop into the left eye 4 (four) times daily.     No facility-administered medications prior to visit.  ROS Reviewed all systems and reported negative except as above     Objective:   Vitals:   05/03/24 1020  BP: 120/76  Pulse: 72  TempSrc: Oral  SpO2: 97%  Weight: 169 lb (76.7 kg)  Height: 5' 9 (1.753 m)    Physical Exam Physical Exam GENERAL: Appropriate to age, no acute distress. HEAD EYES EARS NOSE THROAT: Moist mucous membranes, atraumatic, normocephalic. CHEST: Clear to auscultation bilaterally, no wheezing, no crackles, no rales. CARDIAC: Regular rate and rhythm, normal S1, normal S2, no murmurs, no rubs, no gallops. ABDOMEN: Soft, nontender. NEUROLOGICAL: Motor and sensation grossly intact, alert and oriented times X 3. EXTREMITIES: Warm, well perfused, no edema.     CBC    Component Value Date/Time   WBC 8.7 11/08/2019 1104   WBC 7.3 07/03/2008 1320   RBC 3.70 (L) 11/08/2019 1104   RBC 4.30 07/03/2008 1320   HGB 11.4 (L) 11/08/2019 1104   HCT 34.0 (L) 11/08/2019 1104   PLT 171 11/08/2019 1104   MCV 92 11/08/2019 1104   MCH 30.8 11/08/2019 1104   MCHC 33.5 11/08/2019 1104   MCHC 33.1 07/03/2008 1320   RDW 13.1 11/08/2019 1104     Chest imaging:  PFT:     No data to display          Labs:    Echo:       Assessment & Plan:   Assessment and Plan Assessment & Plan Chronic cough Persisting over a year, productive of clear mucus. No hemoptysis,  infections, or significant allergies. Previous Pepcid  ineffective. Possible causes include silent acid reflux, post-nasal drip, or sinus issues. Lingula scar may contribute. Dietary modifications suggested due to potential silent acid reflux. - Ordered pulmonary function tests to assess lung function. - Prescribed cough medicine for symptomatic relief. - Provided instructions for a GERD diet. - Reinitiated Pepcid  for one month. - Ordered chest X-ray to evaluate lung status.  Scar on left lung (lingula) from prior infection Scar potentially contributing to mucus buildup and chronic cough. - Ordered chest X-ray to evaluate the scar and ensure no new pathology.        Zola Herter, MD  Pulmonary & Critical Care Office: [phone]

## 2024-05-07 ENCOUNTER — Ambulatory Visit (INDEPENDENT_AMBULATORY_CARE_PROVIDER_SITE_OTHER): Payer: Medicare HMO

## 2024-05-07 DIAGNOSIS — I495 Sick sinus syndrome: Secondary | ICD-10-CM

## 2024-05-08 LAB — CUP PACEART REMOTE DEVICE CHECK
Battery Remaining Longevity: 108 mo
Battery Voltage: 3.01 V
Brady Statistic AP VP Percent: 0.04 %
Brady Statistic AP VS Percent: 99.73 %
Brady Statistic AS VP Percent: 0 %
Brady Statistic AS VS Percent: 0.23 %
Brady Statistic RA Percent Paced: 99.76 %
Brady Statistic RV Percent Paced: 0.04 %
Date Time Interrogation Session: 20251117235956
Implantable Lead Connection Status: 753985
Implantable Lead Connection Status: 753985
Implantable Lead Implant Date: 20210524
Implantable Lead Implant Date: 20210524
Implantable Lead Location: 753859
Implantable Lead Location: 753860
Implantable Lead Model: 5076
Implantable Lead Model: 5076
Implantable Pulse Generator Implant Date: 20210524
Lead Channel Impedance Value: 304 Ohm
Lead Channel Impedance Value: 399 Ohm
Lead Channel Impedance Value: 665 Ohm
Lead Channel Impedance Value: 855 Ohm
Lead Channel Pacing Threshold Amplitude: 0.5 V
Lead Channel Pacing Threshold Amplitude: 0.5 V
Lead Channel Pacing Threshold Pulse Width: 0.4 ms
Lead Channel Pacing Threshold Pulse Width: 0.4 ms
Lead Channel Sensing Intrinsic Amplitude: 2.25 mV
Lead Channel Sensing Intrinsic Amplitude: 2.25 mV
Lead Channel Sensing Intrinsic Amplitude: 7 mV
Lead Channel Sensing Intrinsic Amplitude: 7 mV
Lead Channel Setting Pacing Amplitude: 1.5 V
Lead Channel Setting Pacing Amplitude: 2.5 V
Lead Channel Setting Pacing Pulse Width: 0.4 ms
Lead Channel Setting Sensing Sensitivity: 1.2 mV
Zone Setting Status: 755011
Zone Setting Status: 755011

## 2024-05-09 NOTE — Progress Notes (Signed)
 Remote PPM Transmission

## 2024-05-15 ENCOUNTER — Ambulatory Visit: Admitting: Pharmacist

## 2024-05-15 DIAGNOSIS — R059 Cough, unspecified: Secondary | ICD-10-CM | POA: Diagnosis not present

## 2024-05-15 NOTE — Progress Notes (Deleted)
 Patient ID: Victor Stark                 DOB: 1941/03/01                    MRN: [id number]      HPI: Victor Stark is a 83 y.o. male patient referred to lipid clinic by Dr.McAlhany. PMH is significant for HTN, HLD, prostate cancer, CKD, AAA, symptomatic bradycardia s/p pacemaker placement, vertigo .   The patient was seen on 01/30/2024 for lipid management. At that time, he was taking atorvastatin (Lipitor) 10 mg three times per week, which maintained his LDL-C below 70 mg/dL. However, triglycerides were elevated, so Vascepa  was added to help lower TG to goal. Recent labs show that LDL-C has increased to 103 mg/dL, while triglycerides have improved slightly from 203 mg/dL to 813 mg/dL with Vascepa , but remain above target.    Reviewed options for lowering LDL cholesterol, including ezetimibe, PCSK-9 inhibitors, bempedoic acid and inclisiran.  Discussed mechanisms of action, dosing, side effects and potential decreases in LDL cholesterol.  Also reviewed cost information and potential options for patient assistance.  Current Medications: Atorvastatin 10 mg 3 times per week  Intolerances: atorvastatin, rosuvastatin - myalgia, fatigue  Risk Factors: HTN, HLD, CKD, T2DM  LDL goal: <70  Last lab on 04/22/2024 TC 174, TG 186, HDLc 39, LDLc 103    Diet:   Exercise:   Family History:   Social History:   Labs:  Lipid Panel     Component Value Date/Time   CHOL 174 04/22/2024 0945   TRIG 186 (H) 04/22/2024 0945   HDL 39 (L) 04/22/2024 0945   CHOLHDL 4.5 04/22/2024 0945   LDLCALC 103 (H) 04/22/2024 0945   LABVLDL 32 04/22/2024 0945    Past Medical History:  Diagnosis Date   AAA (abdominal aortic aneurysm) 10/21/2013   3.1 in 1/14.    Actinic keratoses    Dr. 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 Bennetta   Cough 03/22/2013   Followed in Pulmonary clinic/ [facility]/ [facility] - try off acei  03/22/2013  > improved 04/19/2013     Diverticulitis 2012   Ganglion cyst of wrist    left- Dr. Leonor   Headache(784.0) 06/06/2013   HTN (hypertension) 04/19/2013   D/c ACEi 03/23/13 due to cough     Hx of cardiovascular stress test    LexiScan  Myoview  (9/15):  Normal study, EF 49% (visually looks normal; quantifies mildly decreased).  // Myoview  01/2019: EF 62, no ischemia or scar, low risk    Hyperlipidemia    Hypertension    Melanoma (HCC) 2013   Dr. Junior   Prostate cancer Victor Stark)    PVC's (premature ventricular contractions) 10/21/2013   Monitor in 2014 revealed normal sinus rhythm with PVCs    Solitary pulmonary nodule 03/23/2013   First detected 07/04/12 - See CT and f/u 09/12/12 improved     Spondylosis    Dr. Letha    Current Outpatient Medications on File Prior to Visit  Medication Sig Dispense Refill   acetaminophen  (TYLENOL ) 325 MG tablet Take 325-650 mg by mouth every 6 (six) hours as needed (pain.).      allopurinol (ZYLOPRIM) 100 MG tablet Take 50 mg by mouth daily.     Cholecalciferol (VITAMIN D) 2000 UNITS CAPS Take 2,000 Units by mouth daily.      Coenzyme Q10  200 MG capsule Take 200 mg by mouth daily.     famotidine  (PEPCID ) 20 MG tablet Take 1 tablet (20 mg total) by mouth 2 (two) times daily.     guaiFENesin -codeine  100-10 MG/5ML syrup Take 5 mLs by mouth every 4 (four) hours as needed for cough. 120 mL 0   icosapent  Ethyl (VASCEPA ) 1 g capsule Take 2 capsules (2 g total) by mouth 2 (two) times daily. 120 capsule 11   meclizine (ANTIVERT) 25 MG tablet Take 25 mg by mouth 3 (three) times daily as needed for dizziness.     Multiple Vitamin (MULTIVITAMIN WITH MINERALS) TABS Take 1 tablet by mouth daily. Centrum Silver     telmisartan -hydrochlorothiazide  (MICARDIS  HCT) 80-12.5 MG tablet Take 1 tablet by mouth daily.     traZODone (DESYREL) 50 MG tablet Take 50 mg by mouth at bedtime.     valACYclovir (VALTREX) 500 MG tablet Take 500 mg by mouth as needed (BREAKOUTS).       No current facility-administered medications on file prior to visit.    Allergies  Allergen Reactions   Ace Inhibitors Cough and Swelling    Other Reaction(s): dry cough, Other (See Comments)  cough, cough, cough  Dry cough, Dry cough, Dry cough   Etodolac Hives and Swelling    Other Reaction(s): hives, hives,swelling   Hydrocodone-Acetaminophen  Hives and Swelling    Other Reaction(s): swelling/hives   Lipitor [Atorvastatin Calcium] Other (See Comments)    Muscle pain/fatigue   Lisinopril Other (See Comments)    Dry cough    Pravastatin Other (See Comments)    Muscle aches  Other Reaction(s): muscle aches, Other (See Comments)  Muscle aches, Muscle aches, Muscle aches   Vicodin [Hydrocodone-Acetaminophen ] Hives and Swelling   Crestor [Rosuvastatin] Other (See Comments)   Dutasteride Other (See Comments)    Lack of therapeutic effect  Other Reaction(s): intolerant  Lack of therapeutic effect, Lack of therapeutic effect, Lack of therapeutic effect   Hydrocodone     Other Reaction(s): Not available   Nsaids     Other reaction(s): CKD  Other Reaction(s): ckd   Amoxicillin Rash    Other Reaction(s): hives, Not available    Assessment/Plan:  1. Hyperlipidemia -  No problems updated. No problem-specific Assessment & Plan notes found for this encounter.    Thank you,  Robbi Blanch, Pharm.D  [facility]. [facility] [address], [place], [place] [address] Phone: [phone]; Fax: [phone]

## 2024-05-20 ENCOUNTER — Ambulatory Visit: Payer: Self-pay | Admitting: Cardiovascular Disease

## 2024-05-23 DIAGNOSIS — I10 Essential (primary) hypertension: Secondary | ICD-10-CM | POA: Diagnosis not present

## 2024-05-23 DIAGNOSIS — R197 Diarrhea, unspecified: Secondary | ICD-10-CM | POA: Diagnosis not present

## 2024-05-23 DIAGNOSIS — R059 Cough, unspecified: Secondary | ICD-10-CM | POA: Diagnosis not present

## 2024-05-23 DIAGNOSIS — Z6825 Body mass index (BMI) 25.0-25.9, adult: Secondary | ICD-10-CM | POA: Diagnosis not present

## 2024-05-30 ENCOUNTER — Ambulatory Visit: Attending: Internal Medicine | Admitting: Cardiovascular Disease

## 2024-05-30 ENCOUNTER — Encounter: Payer: Self-pay | Admitting: Cardiovascular Disease

## 2024-05-30 VITALS — BP 124/73 | HR 82 | Resp 16 | Ht 69.0 in | Wt 171.0 lb

## 2024-05-30 DIAGNOSIS — I495 Sick sinus syndrome: Secondary | ICD-10-CM

## 2024-05-30 LAB — CUP PACEART INCLINIC DEVICE CHECK
Date Time Interrogation Session: 20251211161041
Implantable Lead Connection Status: 753985
Implantable Lead Connection Status: 753985
Implantable Lead Implant Date: 20210524
Implantable Lead Implant Date: 20210524
Implantable Lead Location: 753859
Implantable Lead Location: 753860
Implantable Lead Model: 5076
Implantable Lead Model: 5076
Implantable Pulse Generator Implant Date: 20210524

## 2024-05-30 NOTE — Progress Notes (Signed)
°  Electrophysiology Office Note:    Date:  05/30/2024   ID:  Victor Stark, DOB 10/05/1940, MRN [id number]  PCP:  Okey Carlin Redbird, MD   [facility] Providers Cardiologist:  None     Referring MD: Okey Carlin Redbird, MD   History of Present Illness:    Victor Stark is a 83 y.o. male with a medical history significant for bradycardia with a Medtronic dual-chamber pacemaker in place, referred for device follow-up.      History of Present Illness He has a Medtronic dual-chamber pacemaker placed in May 2021 by Dr. Fernande for sick sinus syndrome.         Today, he reports that he feels well and has no acute complaints. he has no device related complaints -- no new tenderness, drainage, redness.   EKGs/Labs/Other Studies Reviewed Today:     Echocardiogram:  TTE June 2025 LVEF 55 to 60%.  Mildly reduced RV systolic function.  Calcified aortic valve.  Normal mitral valve structure and function     EKG:   EKG Interpretation Date/Time:  Thursday May 30 2024 15:53:22 EST Ventricular Rate:  87 PR Interval:  226 QRS Duration:  142 QT Interval:  382 QTC Calculation: 459 R Axis:   86  Text Interpretation: Atrial-paced rhythm with prolonged AV conduction Right bundle branch block When compared with ECG of 15-May-2023 08:29, No significant change was found Confirmed by Nancey Scotts [phone]) on 05/30/2024 4:07:48 PM     Physical Exam:    VS:  BP 124/73 (BP Location: Left Arm, Patient Position: Sitting, Cuff Size: Normal)   Pulse 82   Resp 16   Ht 5' 9 (1.753 m)   Wt 171 lb (77.6 kg)   SpO2 96%   BMI 25.25 kg/m     Wt Readings from Last 3 Encounters:  05/30/24 171 lb (77.6 kg)  05/03/24 169 lb (76.7 kg)  11/08/23 170 lb 4.8 oz (77.2 kg)     GEN: Well nourished, well developed in no acute distress CARDIAC: RRR, no murmurs, rubs, gallops The device site is normal -- no tenderness, edema, drainage, redness, threatened erosion.  RESPIRATORY:   Normal work of breathing MUSCULOSKELETAL: no edema    ASSESSMENT & PLAN:     Sick sinus syndrome Medtronic dual-chamber pacemaker in place functioning normally I reviewed today's interrogation.  See Paceart for details He is not device dependent today  PVC No symptoms  Atrial high rate episode Had an episode lasting approximately 5 hours on November 27 We will continue to monitor      Signed, Scotts FORBES Nancey, MD  05/30/2024 4:08 PM    [facility]

## 2024-05-30 NOTE — Patient Instructions (Signed)
 Medication Instructions:  Your physician recommends that you continue on your current medications as directed. Please refer to the Current Medication list given to you today.  *If you need a refill on your cardiac medications before your next appointment, please call your pharmacy*  Lab Work: None ordered.  If you have labs (blood work) drawn today and your tests are completely normal, you will receive your results only by: MyChart Message (if you have MyChart) OR A paper copy in the mail If you have any lab test that is abnormal or we need to change your treatment, we will call you to review the results.  Testing/Procedures: None ordered.   Follow-Up: At [facility], you and your health needs are our priority.  As part of our continuing mission to provide you with exceptional heart care, our providers are all part of one team.  This team includes your primary Cardiologist (physician) and Advanced Practice Providers or APPs (Physician Assistants and Nurse Practitioners) who all work together to provide you with the care you need, when you need it.  Your next appointment:   6 months with Dr Marko PA-C

## 2024-06-05 DIAGNOSIS — E1122 Type 2 diabetes mellitus with diabetic chronic kidney disease: Secondary | ICD-10-CM | POA: Diagnosis not present

## 2024-06-05 DIAGNOSIS — R5381 Other malaise: Secondary | ICD-10-CM | POA: Diagnosis not present

## 2024-06-05 DIAGNOSIS — E782 Mixed hyperlipidemia: Secondary | ICD-10-CM | POA: Diagnosis not present

## 2024-06-17 ENCOUNTER — Ambulatory Visit: Payer: Self-pay | Admitting: Cardiovascular Disease

## 2024-07-03 ENCOUNTER — Ambulatory Visit: Admitting: *Deleted

## 2024-07-03 ENCOUNTER — Ambulatory Visit

## 2024-07-03 VITALS — BP 129/76 | HR 83 | Temp 97.7°F | Ht 68.5 in | Wt 163.0 lb

## 2024-07-03 DIAGNOSIS — J449 Chronic obstructive pulmonary disease, unspecified: Secondary | ICD-10-CM

## 2024-07-03 DIAGNOSIS — R053 Chronic cough: Secondary | ICD-10-CM

## 2024-07-03 DIAGNOSIS — Z87891 Personal history of nicotine dependence: Secondary | ICD-10-CM

## 2024-07-03 DIAGNOSIS — K219 Gastro-esophageal reflux disease without esophagitis: Secondary | ICD-10-CM | POA: Diagnosis not present

## 2024-07-03 LAB — PULMONARY FUNCTION TEST
DL/VA % pred: 95 %
DL/VA: 3.66 ml/min/mmHg/L
DLCO cor % pred: 74 %
DLCO cor: 16.86 ml/min/mmHg
DLCO unc % pred: 74 %
DLCO unc: 16.86 ml/min/mmHg
FEF 25-75 Post: 1.22 L/s
FEF 25-75 Pre: 1.37 L/s
FEF2575-%Change-Post: -10 %
FEF2575-%Pred-Post: 74 %
FEF2575-%Pred-Pre: 83 %
FEV1-%Change-Post: -2 %
FEV1-%Pred-Post: 74 %
FEV1-%Pred-Pre: 76 %
FEV1-Post: 1.88 L
FEV1-Pre: 1.92 L
FEV1FVC-%Change-Post: 2 %
FEV1FVC-%Pred-Pre: 104 %
FEV6-%Change-Post: -4 %
FEV6-%Pred-Post: 74 %
FEV6-%Pred-Pre: 77 %
FEV6-Post: 2.49 L
FEV6-Pre: 2.6 L
FEV6FVC-%Change-Post: 0 %
FEV6FVC-%Pred-Post: 107 %
FEV6FVC-%Pred-Pre: 107 %
FVC-%Change-Post: -4 %
FVC-%Pred-Post: 69 %
FVC-%Pred-Pre: 72 %
FVC-Post: 2.5 L
FVC-Pre: 2.61 L
Post FEV1/FVC ratio: 75 %
Post FEV6/FVC ratio: 100 %
Pre FEV1/FVC ratio: 74 %
Pre FEV6/FVC Ratio: 100 %
RV % pred: 126 %
RV: 3.35 L
TLC % pred: 88 %
TLC: 6.02 L

## 2024-07-03 MED ORDER — SPIRIVA RESPIMAT 2.5 MCG/ACT IN AERS: 2.0000 | INHALATION_SPRAY | Freq: Every day | RESPIRATORY_TRACT | 5 refills | Status: AC

## 2024-07-03 NOTE — Patient Instructions (Signed)
 GERD diet instructions   Foods to Avoid:  Acidic foods: Citrus fruits, tomatoes, tomato-based products, vinegar, garlic, onions  Fatty foods: Fried foods, fatty meats, whole milk, cheese  Spicy foods: Chili peppers, black pepper, mustard  Chocolate: Contains caffeine and theobromine, which relax the lower esophageal sphincter (LES)  Alcohol: Relaxes the LES and increases stomach acid production  Caffeine: Found in coffee, tea, and some sodas, it can stimulate stomach acid production    Foods to Eat:  Lean proteins: Chicken, fish, eggs Non-acidic fruits: Apples, bananas, pears, grapes Vegetables: Steamed, roasted, or boiled vegetables (e.g., broccoli, carrots, green beans) Whole grains: Brown rice, quinoa, oatmeal Low-fat dairy: Skim milk, low-fat yogurt, low-fat cheese Water: Staying hydrated helps dilute stomach acid    Other Dietary Recommendations: Eat smaller, more frequent meals. Avoid eating within 2-3 hours of bedtime. Chew food thoroughly. Limit sugary drinks and processed foods. Consider a Mediterranean-style diet, which is rich in fruits, vegetables, and whole grains.        VISIT SUMMARY: During your visit, we discussed your chronic cough and sore throat, which have persisted for the past three to four months. We reviewed your symptoms and current treatments, including your GERD diet and medications. We also discussed your recent pulmonary function tests and the possibility of COPD contributing to your symptoms.  YOUR PLAN: -CHRONIC COUGH: Your chronic cough is likely related to silent acid reflux, which can cause throat congestion and a productive cough, especially at night. We have started you on Pepcid  20 mg twice daily before breakfast and dinner. You should strictly follow the GERD diet for 1.5 months.  -CHRONIC OBSTRUCTIVE PULMONARY DISEASE (COPD): COPD is a lung condition that makes it hard to breathe due to airflow blockage. 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 We have prescribed a COPD inhaler, and the prescription has been sent to [facility] pharmacy. Please monitor your response to the inhaler and report any issues with cost or effectiveness.  -GASTROESOPHAGEAL REFLUX DISEASE (GERD): GERD is a condition where stomach acid frequently flows back into the tube connecting your mouth and stomach, causing irritation. It is suspected to be the primary cause of your chronic cough. Continue taking Pepcid  20 mg twice daily before breakfast and dinner, and strictly adhere to the GERD diet for 1.5 months. We will reassess the effectiveness of this treatment after 1.5 months.  INSTRUCTIONS: Please follow up in 3 months to reassess your chronic cough and in 1.5 months to evaluate the effectiveness of the GERD treatment. Monitor your response to the COPD inhaler and report any issues with cost or effectiveness.        Contains text generated by [employer].

## 2024-07-03 NOTE — Patient Instructions (Signed)
 Full PFT performed today.

## 2024-07-03 NOTE — Progress Notes (Signed)
 Full PFT performed today.

## 2024-07-03 NOTE — Progress Notes (Signed)
 "   Subjective:   PATIENT ID: Victor Stark: 11-06-1940, MRN: [id number]   HPI Discussed the use of AI scribe software for clinical note transcription with the patient, who gave verbal consent to proceed.  History of Present Illness Victor Stark is an 84 year old male who presents with chronic cough and sore throat.   He has experienced a persistent cough and sore throat for the past three to four months, with no significant improvement despite treatment. He attempted a GERD diet, excluding coffee, but found it unhelpful. The symptoms began around four to five months ago, following a previous visit in November 2025.  He has been prescribed cough medicine and Pepcid  for acid reflux, which provided minimal relief. He describes his throat as 'stuffed' and can cough up white phlegm at will. Coughing is more pronounced at night, and he typically has his last meal at 6 PM, with occasional snacks afterward.  He has not used inhalers before. No fever, chills, or night sweats are present, although he feels cold and experiences chills in the evening. No unintentional weight loss is reported, with weight maintained within a five-pound range, and no loss of appetite. He has not coughed up blood.  He mentions a decrease in energy over the past nine months, attributing it to the coughing. No snoring, excessive daytime sleepiness, or falling asleep while reading were reported. He occasionally wakes up tired in the morning.     Past Medical History:  Diagnosis Date   AAA (abdominal aortic aneurysm) 10/21/2013   3.1 in 1/14.    Actinic keratoses    Dr. 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 Bennetta   Cough 03/22/2013   Followed in Pulmonary clinic/ [facility]/ [facility] - try off acei 03/22/2013  > improved 04/19/2013     Diverticulitis 2012   Ganglion cyst of wrist    left- Dr. Leonor    Headache(784.0) 06/06/2013   HTN (hypertension) 04/19/2013   D/c ACEi 03/23/13 due to cough     Hx of cardiovascular stress test    LexiScan  Myoview  (9/15):  Normal study, EF 49% (visually looks normal; quantifies mildly decreased).  // Myoview  01/2019: EF 62, no ischemia or scar, low risk    Hyperlipidemia    Hypertension    Melanoma (HCC) 2013   Dr. Junior   Prostate cancer [facility])    PVC's (premature ventricular contractions) 10/21/2013   Monitor in 2014 revealed normal sinus rhythm with PVCs    Solitary pulmonary nodule 03/23/2013   First detected 07/04/12 - See CT and f/u 09/12/12 improved     Spondylosis    Dr. Letha     Family History  Problem Relation Age of Onset   Heart attack Father    Asthma Sister    Heart attack Daughter 29     Social History   Socioeconomic History   Marital status: Married    Spouse name: Ginnie   Number of children: 2   Years of education: college   Highest education level: Not on file  Occupational History   Occupation: Health And Safety Inspector of Engineer, Production: RETIRED  Tobacco Use   Smoking status: Former    Current packs/day: 0.00    Average packs/day: 0.8 packs/day for 15.0 years (11.3 ttl pk-yrs)    Types: Cigarettes, Cigars    Start date: 06/20/1977    Quit  date: 06/20/1992    Years since quitting: 32.0   Smokeless tobacco: Never  Vaping Use   Vaping status: Never Used  Substance and Sexual Activity   Alcohol use: Yes    Comment: rare   Drug use: Never   Sexual activity: Not on file  Other Topics Concern   Not on file  Social History Narrative   Patient is married([name])Patient has two children.Patient is retired from [employer].Patient drinks two servings of coffee and tea daily.   Work retired:     Live home with wife, Dog    Social Drivers of Health   Tobacco Use: Medium Risk (07/03/2024)   Patient History    Smoking Tobacco Use: Former    Smokeless Tobacco Use: Never    Passive Exposure: Not on Programmer, Applications Strain: Not on file  Food Insecurity: Not on file  Transportation Needs: Not on file  Physical Activity: Not on file  Stress: Not on file  Social Connections: Not on file  Intimate Partner Violence: Not on file  Depression (EYV7-0): Not on file  Alcohol Screen: Not on file  Housing: Not on file  Utilities: Not on file  Health Literacy: Not on file     Allergies[1]   Outpatient Medications Prior to Visit  Medication Sig Dispense Refill   acetaminophen  (TYLENOL ) 325 MG tablet Take 325-650 mg by mouth every 6 (six) hours as needed (pain.).      allopurinol (ZYLOPRIM) 100 MG tablet Take 50 mg by mouth daily.     Cholecalciferol (VITAMIN D) 2000 UNITS CAPS Take 2,000 Units by mouth daily.      Coenzyme Q10 200 MG capsule Take 200 mg by mouth daily.     doxycycline (VIBRAMYCIN) 100 MG capsule 1 capsule.     famotidine  (PEPCID ) 20 MG tablet Take 1 tablet (20 mg total) by mouth 2 (two) times daily.     guaiFENesin -codeine  100-10 MG/5ML syrup Take 5 mLs by mouth every 4 (four) hours as needed for cough. 120 mL 0   icosapent  Ethyl (VASCEPA ) 1 g capsule Take 2 capsules (2 g total) by mouth 2 (two) times daily. 120 capsule 11   meclizine (ANTIVERT) 25 MG tablet Take 25 mg by mouth 3 (three) times daily as needed for dizziness.     Multiple Vitamin (MULTIVITAMIN WITH MINERALS) TABS Take 1 tablet by mouth daily. Centrum Silver     telmisartan -hydrochlorothiazide  (MICARDIS  HCT) 80-12.5 MG tablet Take 1 tablet by mouth daily.     traZODone (DESYREL) 50 MG tablet Take 50 mg by mouth at bedtime.     valACYclovir (VALTREX) 500 MG tablet Take 500 mg by mouth as needed (BREAKOUTS).      cefdinir (OMNICEF) 300 MG capsule Take by mouth. (Patient not taking: Reported on 07/03/2024)     No facility-administered medications prior to visit.    ROS Reviewed all systems and reported negative except as above     Objective:   Vitals:   07/03/24 1400  BP: 129/76  Pulse: 83  Temp:  97.7 F (36.5 C)  TempSrc: Oral  SpO2: 94%  Weight: 163 lb (73.9 kg)  Height: 5' 8.5 (1.74 m)    Physical Exam Physical Exam GENERAL: Appropriate to age, no acute distress. HEAD EYES EARS NOSE THROAT: Moist mucous membranes, atraumatic, normocephalic. CHEST: Clear to auscultation bilaterally, no wheezing, no crackles, no rales. CARDIAC: Regular rate and rhythm, normal S1, normal S2, no murmurs, no rubs, no gallops. ABDOMEN: Soft, nontender. NEUROLOGICAL: Motor and  sensation grossly intact, alert and oriented times X 3. EXTREMITIES: Warm, well perfused, no edema.     CBC    Component Value Date/Time   WBC 8.7 11/08/2019 1104   WBC 7.3 07/03/2008 1320   RBC 3.70 (L) 11/08/2019 1104   RBC 4.30 07/03/2008 1320   HGB 11.4 (L) 11/08/2019 1104   HCT 34.0 (L) 11/08/2019 1104   PLT 171 11/08/2019 1104   MCV 92 11/08/2019 1104   MCH 30.8 11/08/2019 1104   MCHC 33.5 11/08/2019 1104   MCHC 33.1 07/03/2008 1320   RDW 13.1 11/08/2019 1104     Results Diagnostic Pulmonary function testing (07/03/2024): FVC 72% predicted, FEV1 76% predicted, FEV1/FVC ratio 74, total lung capacity 88% by plethysmography, DLCO 74% predicted   PFT:    Latest Ref Rng & Units 07/03/2024   12:27 PM  PFT Results  FVC-Pre L 2.61  P  FVC-Predicted Pre % 72  P  FVC-Post L 2.50  P  FVC-Predicted Post % 69  P  Pre FEV1/FVC % % 74  P  Post FEV1/FCV % % 75  P  FEV1-Pre L 1.92  P  FEV1-Predicted Pre % 76  P  FEV1-Post L 1.88  P  DLCO uncorrected ml/min/mmHg 16.86  P  DLCO UNC% % 74  P  DLCO corrected ml/min/mmHg 16.86  P  DLCO COR %Predicted % 74  P  DLVA Predicted % 95  P  TLC L 6.02  P  TLC % Predicted % 88  P  RV % Predicted % 126  P    P Preliminary result         Assessment & Plan:   Assessment and Plan Assessment & Plan Chronic cough Likely related to silent acid reflux. Symptoms include throat congestion and productive cough with white phlegm, more pronounced at night. PFTs  indicate mild to moderate COPD, but symptoms primarily attributed to GERD. - Initiated Pepcid  20 mg twice daily before breakfast and dinner. - Instructed to adhere strictly to GERD diet for 1.5 months, avoiding tomato, potato-based products, onion, garlic, spicy food, fatty food, coffee, chocolate, and alcohol. - Prescribed COPD inhaler (spiriva )and sent prescription to [facility] pharmacy. - Advised to avoid eating or snacking 3 hours before bedtime. - Will reassess in 3 months if symptoms persist.  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 Small scar on left lung likely from prior infection. - Prescribed COPD inhaler and sent prescription to [facility] pharmacy. - Advised to monitor response to inhaler and report any issues with cost or effectiveness.  Gastroesophageal reflux disease (GERD) GERD suspected as primary cause of chronic cough. Symptoms suggestive of silent acid reflux with throat inflammation. - Continue Pepcid  20 mg twice daily before breakfast and dinner. - Strict adherence to GERD diet for 1.5 months. - Will reassess after 1.5 months to evaluate effectiveness of treatment.        Zola Herter, MD [facility] Office: [phone]       [1]  Allergies Allergen Reactions   Ace Inhibitors Cough and Swelling    Other Reaction(s): dry cough, Other (See Comments)  cough, cough, cough  Dry cough, Dry cough, Dry cough   Etodolac Hives and Swelling    Other Reaction(s): hives, hives,swelling   Hydrocodone-Acetaminophen  Hives and Swelling    Other Reaction(s): swelling/hives   Lipitor [Atorvastatin Calcium] Other (See Comments)  Muscle pain/fatigue   Lisinopril Other (See Comments)    Dry cough    Pravastatin Other (See Comments)    Muscle aches  Other Reaction(s): muscle aches, Other (See Comments)  Muscle  aches, Muscle aches, Muscle aches   Vicodin [Hydrocodone-Acetaminophen ] Hives and Swelling   Crestor [Rosuvastatin] Other (See Comments)   Dutasteride Other (See Comments)    Lack of therapeutic effect  Other Reaction(s): intolerant  Lack of therapeutic effect, Lack of therapeutic effect, Lack of therapeutic effect   Hydrocodone     Other Reaction(s): Not available   Nsaids     Other reaction(s): CKD  Other Reaction(s): ckd   Amoxicillin Rash    Other Reaction(s): hives, Not available   "

## 2024-07-04 ENCOUNTER — Telehealth: Payer: Self-pay

## 2024-07-04 NOTE — Telephone Encounter (Signed)
 Can we run a test claim on spirivia or incruse what would be more cost effective through patient insurance

## 2024-07-04 NOTE — Telephone Encounter (Signed)
 Copied from CRM [id number]. Topic: Clinical - Prescription Issue >> Jul 03, 2024  4:27 PM Victor Stark wrote: Reason for CRM: Patient states Tiotropium Bromide (SPIRIVA  RESPIMAT) 2.5 MCG/ACT AERS [[id number]] is to expensive - patient states he has an alternative he would like if it is ok  PRIMATENE MIST.   Callback number: [phone]   Please advise

## 2024-07-05 ENCOUNTER — Telehealth: Payer: Self-pay

## 2024-07-05 ENCOUNTER — Other Ambulatory Visit (HOSPITAL_COMMUNITY): Payer: Self-pay

## 2024-07-05 NOTE — Telephone Encounter (Signed)
*  Pulm  Spiriva  Respimat- $499.05 Incruse Ellipta- $335.19  Patient has a deductible to meet causing higher prices.

## 2024-07-07 NOTE — Progress Notes (Addendum)
 Patient ID: Victor Stark                 DOB: 1940-09-01                    MRN: [id number]      HPI: Victor Stark is a 84 y.o. male patient referred to lipid clinic by Dr.McAlhany. PMH is significant for HTN, HLD, T2DM, prostate cancer, CKD, AAA, symptomatic bradycardia s/p pacemaker placement, vertigo   Due to intolerance to statins, patient takes Atorvastatin 10mg  3 times weekly. At last visit in August 2025, pt's LDL was at goal at 60 but TG were elevated at 203. Given A1c was in diabetic range OTC fish oil was replaced with Vascepa  to get TG at goal and reduce CVD risk. Labs were done in Nov 2025 and now show his LDL increased to 103 and TG decreased to 186. Therefore he is not at goal for LDL or TG.   Today we reviewed options for lowering LDL cholesterol, including ezetimibe , PCSK-9 inhibitors and bempedoic acid Discussed mechanisms of action, dosing, side effects and potential decreases in LDL cholesterol. Also reviewed cost information and potential options for patient assistance.  For LDL management, the patient is hesitant to start injectable PCSK-9 therapy and is more agreeable to starting ezetimibe  10 mg daily while continuing atorvastatin 10 mg daily, with plans to recheck lipid panels in 3 months.  Given that patient is implementing dietary changes, no changes will be made to his Vascepa , and TG levels will be checked in 3 months as well.   Current Medications: Vascepa  1 gm twice daily and Atorvastatin 10 mg daily.  Intolerances: Pravastatin, Rosuvastatin - myalgia, fatigue  Risk Factors: HTN, HLD, CKD, T2DM   LDL goal: <70 TG goal: <150   Diet: Has implemented a healthy diet in the past week  B: Cereal, orange juice  L/D: Baked chicken  Drinks: water, tea w/ half/half   Exercise: Minimal due to shoulder pain, golf 2-3 weekly   Family History:  Father - died from MI at age 75  Brother - died MI at age 63    Social History:  Alcohol: No Smoke:  no  Labs:  Lipid Panel     Component Value Date/Time   CHOL 174 04/22/2024 0945   TRIG 186 (H) 04/22/2024 0945   HDL 39 (L) 04/22/2024 0945   CHOLHDL 4.5 04/22/2024 0945   LDLCALC 103 (H) 04/22/2024 0945   LABVLDL 32 04/22/2024 0945    Past Medical History:  Diagnosis Date   AAA (abdominal aortic aneurysm) 10/21/2013   3.1 in 1/14.    Actinic keratoses    Dr. 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 Bennetta   Cough 03/22/2013   Followed in Pulmonary clinic/ [facility]/ [facility] - try off acei 03/22/2013  > improved 04/19/2013     Diverticulitis 2012   Ganglion cyst of wrist    left- Dr. Leonor   Headache(784.0) 06/06/2013   HTN (hypertension) 04/19/2013   D/c ACEi 03/23/13 due to cough     Hx of cardiovascular stress test    LexiScan  Myoview  (9/15):  Normal study, EF 49% (visually looks normal; quantifies mildly decreased).  // Myoview  01/2019: EF 62, no ischemia or scar, low risk    Hyperlipidemia    Hypertension    Melanoma (HCC) 2013   Dr. Junior   Prostate cancer [facility])  PVC's (premature ventricular contractions) 10/21/2013   Monitor in 2014 revealed normal sinus rhythm with PVCs    Solitary pulmonary nodule 03/23/2013   First detected 07/04/12 - See CT and f/u 09/12/12 improved     Spondylosis    Dr. Letha    Medications Ordered Prior to Encounter[1]  Allergies[2]  Assessment/Plan:  1. Hyperlipidemia -  Problem  Hyperlipidemia   Current Medications: Atorvastatin 10 mg 3 times per week,Vascepa  2 gm twice daily  Intolerances: atorvastatin, rosuvastatin - myalgia, fatigue  Risk Factors: HTN, HLD, CKD, family hx of CAD/MI LDL goal: <70 mg/dl Last lab 93/7974 : TC 864, TG 203, LDLc 60, HDL 42 while on Atorvastatin 10 mg 3 times per week     Hyperlipidemia Assessment:  LDL goal: <70 mg/dl. Last LDLc 103 mg/dl (November 7974) Tolerates moderate intensity statins  (Atorvastatin 10mg )  well without any side effects  Intolerance to rosuvastatin and pravastatin  Discussed next potential options (ezetimibe , PCSK-9 inhibitors, and bempedoic acid); cost, dosing efficacy, side effects  Patient hesitant to start injectable therapy and will prefer trying another oral option first   Plan: Continue taking Atorvastatin 10mg  daily and Vascepa  2 gm twice daily  Start taking Ezetimibe  10mg  daily  Continue implementing healthy dietary changes  Fasting lipid lab and LFTs due in 3 months after starting Ezetimibe     Thank you, Zunaira Afsar, PharmD Candidate   Robbi Blanch, Pharm.D [facility]. [facility] [address], [place], [place] [address] Phone: [phone]; Fax: [phone]        [1]  Current Outpatient Medications on File Prior to Visit  Medication Sig Dispense Refill   acetaminophen  (TYLENOL ) 325 MG tablet Take 325-650 mg by mouth every 6 (six) hours as needed (pain.).      allopurinol (ZYLOPRIM) 100 MG tablet Take 50 mg by mouth daily.     cefdinir (OMNICEF) 300 MG capsule Take by mouth.     Cholecalciferol (VITAMIN D) 2000 UNITS CAPS Take 2,000 Units by mouth daily.      Coenzyme Q10 200 MG capsule Take 200 mg by mouth daily.     doxycycline (VIBRAMYCIN) 100 MG capsule 1 capsule.     famotidine  (PEPCID ) 20 MG tablet Take 1 tablet (20 mg total) by mouth 2 (two) times daily.     guaiFENesin -codeine  100-10 MG/5ML syrup Take 5 mLs by mouth every 4 (four) hours as needed for cough. 120 mL 0   icosapent  Ethyl (VASCEPA ) 1 g capsule Take 2 capsules (2 g total) by mouth 2 (two) times daily. 120 capsule 11   meclizine (ANTIVERT) 25 MG tablet Take 25 mg by mouth 3 (three) times daily as needed for dizziness.     Multiple Vitamin (MULTIVITAMIN WITH MINERALS) TABS Take 1 tablet by mouth daily. Centrum Silver     telmisartan -hydrochlorothiazide  (MICARDIS  HCT) 80-12.5 MG tablet Take 1 tablet by mouth daily.      Tiotropium Bromide (SPIRIVA  RESPIMAT) 2.5 MCG/ACT AERS Inhale 2 puffs into the lungs daily. 4 g 5   traZODone (DESYREL) 50 MG tablet Take 50 mg by mouth at bedtime.     valACYclovir (VALTREX) 500 MG tablet Take 500 mg by mouth as needed (BREAKOUTS).      No current facility-administered medications on file prior to visit.  [2]  Allergies Allergen Reactions   Ace Inhibitors Cough and Swelling    Other Reaction(s): dry cough, Other (See Comments)  cough, cough, cough  Dry cough, Dry cough, Dry  cough   Etodolac Hives and Swelling    Other Reaction(s): hives, hives,swelling   Hydrocodone-Acetaminophen  Hives and Swelling    Other Reaction(s): swelling/hives   Lipitor [Atorvastatin Calcium] Other (See Comments)    Muscle pain/fatigue   Lisinopril Other (See Comments)    Dry cough    Pravastatin Other (See Comments)    Muscle aches  Other Reaction(s): muscle aches, Other (See Comments)  Muscle aches, Muscle aches, Muscle aches   Vicodin [Hydrocodone-Acetaminophen ] Hives and Swelling   Crestor [Rosuvastatin] Other (See Comments)   Dutasteride Other (See Comments)    Lack of therapeutic effect  Other Reaction(s): intolerant  Lack of therapeutic effect, Lack of therapeutic effect, Lack of therapeutic effect   Hydrocodone     Other Reaction(s): Not available   Nsaids     Other reaction(s): CKD  Other Reaction(s): ckd   Amoxicillin Rash    Other Reaction(s): hives, Not available

## 2024-07-08 ENCOUNTER — Ambulatory Visit: Admitting: Pharmacist

## 2024-07-08 ENCOUNTER — Encounter: Payer: Self-pay | Admitting: Pharmacist

## 2024-07-08 DIAGNOSIS — E7849 Other hyperlipidemia: Secondary | ICD-10-CM

## 2024-07-08 MED ORDER — EZETIMIBE 10 MG PO TABS: 10.0000 mg | ORAL_TABLET | Freq: Every day | ORAL | 3 refills | Status: AC

## 2024-07-08 NOTE — Assessment & Plan Note (Addendum)
 Assessment:  LDL goal: <70 mg/dl. Last LDLc 103 mg/dl (November 7974) Tolerates moderate intensity statins (Atorvastatin 10mg )  well without any side effects  Intolerance to rosuvastatin and pravastatin  Discussed next potential options (ezetimibe , PCSK-9 inhibitors, and bempedoic acid); cost, dosing efficacy, side effects  Patient hesitant to start injectable therapy and will prefer trying another oral option first   Plan: Continue taking Atorvastatin 10mg  daily and Vascepa  2 gm twice daily  Start taking Ezetimibe  10mg  daily  Continue implementing healthy dietary changes  Fasting lipid lab and LFTs due in 3 months after starting Ezetimibe 

## 2024-07-09 ENCOUNTER — Telehealth: Payer: Self-pay | Admitting: Pharmacist

## 2024-07-09 NOTE — Telephone Encounter (Signed)
 Pt c/o medication issue:  1. Name of Medication:   icosapent  Ethyl (VASCEPA ) 1 g capsule    2. How are you currently taking this medication (dosage and times per day)?   3. Are you having a reaction (difficulty breathing--STAT)? No   4. What is your medication issue? Pt asking if he should continue taking medication

## 2024-07-10 NOTE — Telephone Encounter (Signed)
 Pt calling to f/u from message yesterday. Please advise

## 2024-07-11 NOTE — Telephone Encounter (Signed)
 Pt advised to continue to take Vascepa 

## 2024-07-19 NOTE — Telephone Encounter (Signed)
 Please see high priced for inhaler

## 2024-07-24 ENCOUNTER — Ambulatory Visit (INDEPENDENT_AMBULATORY_CARE_PROVIDER_SITE_OTHER)

## 2024-07-24 ENCOUNTER — Encounter (INDEPENDENT_AMBULATORY_CARE_PROVIDER_SITE_OTHER): Payer: Self-pay

## 2024-07-24 VITALS — BP 134/79 | HR 71 | Ht 68.5 in | Wt 163.0 lb

## 2024-07-24 DIAGNOSIS — R09A2 Foreign body sensation, throat: Secondary | ICD-10-CM

## 2024-07-24 DIAGNOSIS — R0989 Other specified symptoms and signs involving the circulatory and respiratory systems: Secondary | ICD-10-CM | POA: Diagnosis not present

## 2024-07-24 DIAGNOSIS — K219 Gastro-esophageal reflux disease without esophagitis: Secondary | ICD-10-CM | POA: Diagnosis not present

## 2024-07-24 DIAGNOSIS — J3489 Other specified disorders of nose and nasal sinuses: Secondary | ICD-10-CM | POA: Diagnosis not present

## 2024-07-24 MED ORDER — PANTOPRAZOLE SODIUM 20 MG PO TBEC: 20.0000 mg | DELAYED_RELEASE_TABLET | Freq: Every evening | ORAL | 0 refills | Status: AC

## 2024-07-24 NOTE — Telephone Encounter (Signed)
 Called and spoke with the pt and advised of Dr. Zaida note.  Pt states he never picked up the spiriva  inhaler due to it being expensive.  Pt stated he would not like to proceed with an inhaler since they are so pricey. Routing to you Dr. Zaida just as an FYI that pt is not going to be using an inhaler.

## 2024-07-24 NOTE — Progress Notes (Signed)
 Dear Dr. Okey, Here is my assessment for our mutual patient, Victor Stark. Thank you for allowing me the opportunity to care for your patient. Please do not hesitate to contact me should you have any other questions. Sincerely, Dr. Hadassah Parody  Otolaryngology Clinic Note Referring provider: Dr. Okey HPI:   Initial HPI (07/24/2024)  84 year old male who presents for evaluation of chronic throat clearing.  He has had persistent throat clearing with intermittent cough for approximately 4 months.  Gradual onset and initially associated with sinus congestion and sensation of sinus fullness.  The sinus congestion has now improved since taking antibiotics but he continues to have throat clearing.  He feels the throat clearing started even before the sinus problems, approximately for 4 months.  Throat clearing is most pronounced at night when supine and sometimes has to cough up frothy sputum.  Constantly feels like something is in his throat.  Overall the symptoms are slowly improving.  No known history of reflux. Typically eats dinner 4 hours before bedtime Does not sleep with head elevated.    Independent Review of Additional Tests or Records:  CT head 08/04/2023 independently reviewed showing slight rightward bowing of the septum, bilateral infra turbinate hypertrophy, clear nasal sinuses throughout   PMH/Meds/All/SocHx/FamHx/ROS:   Past Medical History:  Diagnosis Date   AAA (abdominal aortic aneurysm) 10/21/2013   3.1 in 1/14.    Actinic keratoses    Dr. 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 Bennetta   Cough 03/22/2013   Followed in Pulmonary clinic/  Healthcare/ [facility] - try off acei 03/22/2013  > improved 04/19/2013     Diverticulitis 2012   Ganglion cyst of wrist    left- Dr. Leonor   Headache(784.0) 06/06/2013   HTN (hypertension) 04/19/2013   D/c ACEi 03/23/13 due to cough     Hx of  cardiovascular stress test    LexiScan  Myoview  (9/15):  Normal study, EF 49% (visually looks normal; quantifies mildly decreased).  // Myoview  01/2019: EF 62, no ischemia or scar, low risk    Hyperlipidemia    Hypertension    Melanoma (HCC) 2013   Dr. Junior   Prostate cancer [facility])    PVC's (premature ventricular contractions) 10/21/2013   Monitor in 2014 revealed normal sinus rhythm with PVCs    Solitary pulmonary nodule 03/23/2013   First detected 07/04/12 - See CT and f/u 09/12/12 improved     Spondylosis    Dr. 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   Coenzyme Q10 200 mg, Daily   doxycycline (VIBRAMYCIN) 100 MG capsule 1 capsule   ezetimibe  (ZETIA ) 10 mg, Oral, Daily  famotidine  (PEPCID ) 20 mg, Oral, 2 times daily   guaiFENesin -codeine  100-10 MG/5ML syrup 5 mLs, Oral, Every 4 hours PRN   icosapent  Ethyl (VASCEPA ) 2 g, Oral, 2 times daily   meclizine (ANTIVERT) 25 mg, 3 times daily PRN   Multiple Vitamin (MULTIVITAMIN WITH MINERALS) TABS 1 tablet, Daily   pantoprazole  (PROTONIX ) 20 mg, Oral, Nightly   telmisartan -hydrochlorothiazide  (MICARDIS  HCT) 80-12.5 MG tablet 1 tablet, Daily   Tiotropium Bromide (SPIRIVA  RESPIMAT) 2.5 MCG/ACT AERS 2 puffs, Inhalation, Daily   traZODone (DESYREL) 50 mg, Daily at bedtime   valACYclovir  (VALTREX) 500 mg, As needed   Vitamin D 2,000 Units, Daily     Physical Exam:   BP 134/79 (BP Location: Left Arm, Patient Position: Sitting)   Pulse 71   Ht 5' 8.5 (1.74 m)   Wt 163 lb (73.9 kg)   SpO2 93%   BMI 24.42 kg/m   Salient findings:  CN II-XII intact    Bilateral EAC clear and TM intact with well pneumatized middle ear spaces  Anterior rhinoscopy: Septum slight rightward deviation; bilateral inferior turbinates with hypertrophy  No lesions of oral cavity/oropharynx  No obviously palpable neck masses/lymphadenopathy/thyromegaly  No respiratory distress or stridor TFL was indicated to better evaluate the proximal airway, given the patient's history and exam findings, and is detailed below.   Seprately Identifiable Procedures:  Prior to initiating any procedures, risks/benefits/alternatives were explained to the patient and verbal consent obtained.  Procedure Note (07/24/2024) Pre-procedure diagnosis: Chronic throat clearing Post-procedure diagnosis: Same Procedure: Transnasal Fiberoptic Laryngoscopy, CPT 31575 - Mod 25 Indication: Chronic throat clearing Complications: None apparent EBL: 0 mL  The procedure was undertaken to further evaluate the patient's complaint of chronic throat clearing, with mirror exam inadequate for appropriate examination due to gag reflex and poor patient tolerance  Procedure:  Patient was identified as correct patient. Verbal consent was obtained. The nose was sprayed with oxymetazoline and 4% lidocaine . The The flexible laryngoscope was passed through the nose to view the nasal cavity, pharynx (oropharynx, hypopharynx) and larynx.  The larynx was examined at rest and during multiple phonatory tasks. Documentation was obtained and reviewed with patient. The scope was removed. The patient tolerated the procedure well.  Findings: The nasal cavity and nasopharynx did not reveal any masses or lesions, mucosa appeared to be without obvious  lesions. The tongue base, pharyngeal walls, piriform sinuses, vallecula, epiglottis and postcricoid region are normal in appearance. The visualized portion of the subglottis and proximal trachea is widely patent. The vocal folds are mobile bilaterally. There are no lesions on the free edge of the vocal folds nor elsewhere in the larynx worrisome for malignancy.    Electronically signed by: Hadassah JAYSON Parody, MD 07/24/2024 6:13 PM   Impression & Plans:  Victor Stark is a 84 y.o. male with     ICD-10-CM   1. Chronic throat clearing  R09.89     2. Globus sensation  R09.A2     3. 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 Laryngoscopy revealed no anatomic abnormalities or signs of infection. Symptoms are gradually improving but remain disruptive at night.  Discussed options including observation alone given that he is improving versus trialing  short course of low-dose reflux medication.  He prefers to trial the reflux medication.  Anticipated outcome is further symptom improvement; if no benefit from reflux therapy, discontinuation is advised. - Performed nasal and laryngeal endoscopy, which was normal. - Discussed conservative management including minimizing throat clearing and increasing water intake. - Recommended elevating head of bed at night and maintaining a gap of several hours between dinner and bedtime. - Prescribed a trial of reflux medication  - Instructed him to continue current medications for other conditions as previously prescribed. - Arranged follow-up in a couple of months to reassess symptoms and response to therapy. -  Advised discontinuation of reflux medication if no improvement is noted after the trial period.  Sinus pressure and pain - Now resolved.  CT head shows no evidence of sinus disease.   See below regarding exact medications prescribed this encounter including dosages and route: Meds ordered this encounter  Medications   pantoprazole  (PROTONIX ) 20 MG tablet    Sig: Take 1 tablet (20 mg total) by mouth at bedtime.    Dispense:  90 tablet    Refill:  0     Thank you for allowing me the opportunity to care for your patient. Please do not hesitate to contact me should you have any other questions.  Sincerely, Hadassah Parody, MD Otolaryngologist (ENT), [facility] Phone: [phone] Fax: [phone]  MDM:  Level 4 Complexity/Problems addressed: 3-uncomplicated problem Data complexity: 4-  independent review of CT head - Morbidity: 4-prescription drug management - Prescription Drug prescribed or managed: yes

## 2024-07-25 ENCOUNTER — Telehealth: Payer: Self-pay | Admitting: Cardiovascular Disease

## 2024-07-25 NOTE — Telephone Encounter (Signed)
 Spoke to patient f/u lab is not due till mid May 2026

## 2024-07-25 NOTE — Telephone Encounter (Signed)
 Pt requesting a c/b he would like to know why lipid blood work is needed

## 2024-09-18 ENCOUNTER — Ambulatory Visit (INDEPENDENT_AMBULATORY_CARE_PROVIDER_SITE_OTHER)

## 2024-10-23 ENCOUNTER — Ambulatory Visit: Admitting: Cardiovascular Disease

## 2024-10-23 ENCOUNTER — Ambulatory Visit
